# Patient Record
Sex: Female | Born: 1945
Health system: Southern US, Community
[De-identification: ages and names within clinical notes are randomized; demographics above are authoritative.]

## PROBLEM LIST (undated history)

## (undated) DIAGNOSIS — F418 Other specified anxiety disorders: Secondary | ICD-10-CM

## (undated) DIAGNOSIS — M810 Age-related osteoporosis without current pathological fracture: Secondary | ICD-10-CM

## (undated) DIAGNOSIS — E559 Vitamin D deficiency, unspecified: Secondary | ICD-10-CM

## (undated) DIAGNOSIS — M1990 Unspecified osteoarthritis, unspecified site: Secondary | ICD-10-CM

## (undated) DIAGNOSIS — R030 Elevated blood-pressure reading, without diagnosis of hypertension: Secondary | ICD-10-CM

## (undated) DIAGNOSIS — I10 Essential (primary) hypertension: Secondary | ICD-10-CM

## (undated) DIAGNOSIS — R7301 Impaired fasting glucose: Secondary | ICD-10-CM

## (undated) DIAGNOSIS — Z973 Presence of spectacles and contact lenses: Secondary | ICD-10-CM

## (undated) DIAGNOSIS — K219 Gastro-esophageal reflux disease without esophagitis: Secondary | ICD-10-CM

## (undated) DIAGNOSIS — S42202A Unspecified fracture of upper end of left humerus, initial encounter for closed fracture: Secondary | ICD-10-CM

## (undated) DIAGNOSIS — I4891 Unspecified atrial fibrillation: Secondary | ICD-10-CM

## (undated) DIAGNOSIS — M8580 Other specified disorders of bone density and structure, unspecified site: Secondary | ICD-10-CM

## (undated) DIAGNOSIS — I480 Paroxysmal atrial fibrillation: Secondary | ICD-10-CM

## (undated) DIAGNOSIS — C801 Malignant (primary) neoplasm, unspecified: Secondary | ICD-10-CM

## (undated) DIAGNOSIS — S82891A Other fracture of right lower leg, initial encounter for closed fracture: Secondary | ICD-10-CM

## (undated) DIAGNOSIS — E785 Hyperlipidemia, unspecified: Secondary | ICD-10-CM

## (undated) DIAGNOSIS — H9319 Tinnitus, unspecified ear: Secondary | ICD-10-CM

## (undated) DIAGNOSIS — IMO0001 Reserved for inherently not codable concepts without codable children: Secondary | ICD-10-CM

## (undated) HISTORY — DX: Other specified disorders of bone density and structure, unspecified site: M85.80

## (undated) HISTORY — DX: Paroxysmal atrial fibrillation: I48.0

## (undated) HISTORY — DX: Other specified anxiety disorders: F41.8

## (undated) HISTORY — DX: Hyperlipidemia, unspecified: E78.5

## (undated) HISTORY — DX: Vitamin D deficiency, unspecified: E55.9

## (undated) HISTORY — DX: Impaired fasting glucose: R73.01

## (undated) HISTORY — DX: Elevated blood-pressure reading, without diagnosis of hypertension: R03.0

## (undated) HISTORY — DX: Age-related osteoporosis without current pathological fracture: M81.0

## (undated) HISTORY — DX: Gastro-esophageal reflux disease without esophagitis: K21.9

## (undated) HISTORY — DX: Reserved for inherently not codable concepts without codable children: IMO0001

## (undated) HISTORY — PX: TIBIA FRACTURE SURGERY: SHX806

---

## 2006-04-20 HISTORY — PX: TIBIA FRACTURE SURGERY: SHX806

## 2006-04-25 ENCOUNTER — Emergency Department (HOSPITAL_COMMUNITY): Admission: EM | Admit: 2006-04-25 | Discharge: 2006-04-25 | Payer: Self-pay | Admitting: Emergency Medicine

## 2006-04-30 ENCOUNTER — Inpatient Hospital Stay (HOSPITAL_COMMUNITY): Admission: RE | Admit: 2006-04-30 | Discharge: 2006-05-01 | Payer: Self-pay | Admitting: Orthopaedic Surgery

## 2007-05-02 ENCOUNTER — Encounter: Admission: RE | Admit: 2007-05-02 | Discharge: 2007-05-02 | Payer: Self-pay | Admitting: Internal Medicine

## 2007-05-24 ENCOUNTER — Other Ambulatory Visit: Admission: RE | Admit: 2007-05-24 | Discharge: 2007-05-24 | Payer: Self-pay | Admitting: Gynecology

## 2008-06-19 ENCOUNTER — Encounter: Admission: RE | Admit: 2008-06-19 | Discharge: 2008-06-19 | Payer: Self-pay | Admitting: Obstetrics and Gynecology

## 2010-11-26 ENCOUNTER — Other Ambulatory Visit: Payer: Self-pay | Admitting: Obstetrics and Gynecology

## 2010-11-26 DIAGNOSIS — R928 Other abnormal and inconclusive findings on diagnostic imaging of breast: Secondary | ICD-10-CM

## 2010-12-02 ENCOUNTER — Ambulatory Visit
Admission: RE | Admit: 2010-12-02 | Discharge: 2010-12-02 | Disposition: A | Discharge status: Home / Self Care | Payer: Self-pay | Source: Ambulatory Visit | Attending: Obstetrics and Gynecology | Admitting: Obstetrics and Gynecology

## 2010-12-02 DIAGNOSIS — R928 Other abnormal and inconclusive findings on diagnostic imaging of breast: Secondary | ICD-10-CM

## 2011-02-05 NOTE — Op Note (Signed)
NAMEEARLIE, Alyssa Martin               ACCOUNT NO.:  [id number]   MEDICAL RECORD NO.:  [id number]          PATIENT TYPE:  OIB   LOCATION:  5010                         FACILITY:  [facility]   PHYSICIAN:  Vanita Panda. Magnus Ivan, M.D.DATE OF BIRTH:  10-23-1945   DATE OF PROCEDURE:  04/29/2006  DATE OF DISCHARGE:                                 OPERATIVE REPORT   PREOPERATIVE DIAGNOSIS:  Left ankle trimalleolar ankle fracture-dislocation.   POSTOPERATIVE DIAGNOSIS:  Left ankle trimalleolar ankle fracture-  dislocation.   PROCEDURE:  Open reduction internal fixation of left ankle fracture  including fixation of left fibula and left medial malleolus only.   SURGEON:  Vanita Panda. Magnus Ivan, MD   ANESTHESIA:  General.   ANTIBIOTICS:  600 mg IV clindamycin.   BLOOD LOSS:  Minimal.   COMPLICATIONS:  None.   TOURNIQUET TIME:  1 hour 32 minutes.   INDICATIONS:  Briefly Alyssa Martin is a very pleasant 65 year old who earlier  this week sustained a mechanical fall down a few stairs when she was moving  to a new house.  She sustained a trimalleolar ankle fracture-dislocation.  I  did see her in the emergency room, and she had intact skin.  I was able to  easily reduce her ankle and splint her, and she is presenting today for  operative intervention.  She has now noticed some tingling in her foot and  said her pain was minimal, and her foot was well-perfused.  I explained the  risks and benefits of the surgery to her, and she agreed to proceed with  surgery.   PROCEDURE DESCRIPTION:  After informed was obtained, the appropriate left  ankle was marked.  She was brought to the operating room and placed supine  on the operating table.  General anesthesia was then obtained.  A nonsterile  tourniquet was placed high on her upper thigh, and a bump was placed under  her left hip.  Her leg was then prepped and draped with DuraPrep and sterile  drapes.  An Esmarch was used to wrap up the leg and  tourniquet inflated to  300 mm of pressure.  Of note, prior to inflating the tourniquet, she was  identified as the correct patient and the correct extremity.  I then made an  incision over the fibula over the lateral aspect of her ankle, and a  fracture hematoma was noted.  I carried this down to the soft tissues to the  fibula in a meticulous dissection.  With the reduction forceps, I could  easily reduce a comminuted Weber B ankle fracture, a Weber B fibular  fracture.  I placed a single lag screw from anterior to posterior lagging  the fracture and could remove the reduction forceps.  I then placed a 1/3  semitubular Synthes locking plate along the posterior aspect as I could of  the fibula in an antiglide technique.  This was then secured with 3 locking  screws proximal and 2 locking screws distal.  Next, attention was turned to  the medial malleolus.  This fracture was reduced in its entirety.  This was  a low medial malleolus fracture.  I made an incision directly over this and  surprisingly found the deltoid ligament intact.  I was easily able to  identify the fracture fragments as a pretty distal medial malleolus  fracture.  I was able to secure this with a single guidepin and then holding  with a dental pick, placed a drill 90 degrees over fracture plane with a 2.5  mm drill bit.  I then placed a single partially-threaded cancellus screw  that was 4.0 mm in diameter and 50 mm in length.  This held the fracture in  reduced position and then under direct fluoroscopy, I was able to assess the  ankle throughout flexion, extension, and rotation and found to be intact.  The posterior malleolus piece was less than 10% of the articular surface of  the posterior malleolus, and ankle was again found to be intact.  I then  irrigated copiously all wounds and closed the deep tissue with interrupted 0  Vicryl suture followed with 2-0 Vicryl suture in the subcutaneous tissue and  2-0 nylon on  the skin in interrupted format.  Xeroform was applied followed  by a well-padded sterile dressing and then a posterior splint with stirrups  with plaster.  The tourniquet was let down at 1 hour and 32 minutes, and the  toes did pinken nicely.  The patient was then awakened, extubated, and taken  to the recovery room in stable condition.           ______________________________  Vanita Panda. Magnus Ivan, M.D.     CYB/MEDQ  D:  04/29/2006  T:  04/29/2006  Job:  [id number]

## 2011-02-05 NOTE — Discharge Summary (Signed)
Alyssa Martin, Alyssa Martin               ACCOUNT NO.:  [id number]   MEDICAL RECORD NO.:  [id number]          PATIENT TYPE:  INP   LOCATION:  5010                         FACILITY:  [facility]   PHYSICIAN:  Vanita Panda. Magnus Ivan, M.D.DATE OF BIRTH:  1945/11/22   DATE OF ADMISSION:  04/29/2006  DATE OF DISCHARGE:  05/01/2006                                 DISCHARGE SUMMARY   ADMITTING DIAGNOSIS:  Left trimalleolar ankle fracture.   DISCHARGE DIAGNOSIS:  Left trimalleolar ankle fracture.   PROCEDURE:  Open reduction internal fixation of left trimalleolar ankle  fracture dislocation.   HOSPITAL COURSE:  Alyssa Martin is a very pleasant individual who sustained a  mechanical fall less than a week prior to admission.  She was found to have  a closed ankle fracture dislocation and underwent closed reduction in the  emergency room.  She now presents for definitive treatment of this unstable  ankle fracture dislocation.  She was taken to the operating room on the day  of admission, where under general anesthesia, she underwent open reduction  internal fixation of the left ankle fibula and medial malleolus.  This was  done under fluoroscopy and was found to have a stable pattern after  fixation.  She tolerated this well and a splint was applied.  For details of  operation, please refer to the operative note in patient's medical records.  Postoperatively she did quite well and her postoperative course was  uneventful.  She began working with physical therapy with gait training with  a walker and nonweightbearing on that foot.  By the day of discharge, she  was tolerating a regular diet and oral pain medications and was deemed safe  to be discharged to home from a physical therapy standpoint.   DISPOSITION:  To home.   DISCHARGE MEDICATIONS:  She was given a prescription for oxycodone for pain  with the following instructions:  To continue to ambulate as tolerated, but  with touch-down weightbearing  only on her foot.  The followup will be  established in the office in 2 weeks after discharge.           ______________________________  Vanita Panda. Magnus Ivan, M.D.     CYB/MEDQ  D:  05/17/2006  T:  05/17/2006  Job:  [id number]

## 2011-05-10 HISTORY — PX: COLONOSCOPY: SHX174

## 2011-06-21 HISTORY — PX: DIAGNOSTIC MAMMOGRAM: HXRAD719

## 2012-09-08 ENCOUNTER — Encounter: Payer: Self-pay | Admitting: Cardiology

## 2012-09-08 ENCOUNTER — Ambulatory Visit (INDEPENDENT_AMBULATORY_CARE_PROVIDER_SITE_OTHER): Payer: BC Managed Care – PPO | Admitting: Cardiology

## 2012-09-08 VITALS — BP 130/78 | HR 74 | Ht 59.0 in | Wt 116.4 lb

## 2012-09-08 DIAGNOSIS — I4891 Unspecified atrial fibrillation: Secondary | ICD-10-CM

## 2012-09-08 DIAGNOSIS — I480 Paroxysmal atrial fibrillation: Secondary | ICD-10-CM | POA: Insufficient documentation

## 2012-09-08 MED ORDER — DILTIAZEM HCL ER COATED BEADS 180 MG PO CP24: 180.0000 mg | ORAL_CAPSULE | Freq: Every day | ORAL | Status: DC

## 2012-09-08 NOTE — Patient Instructions (Signed)
Stop Xarelto   Start ASA 81 mg daily  Switch diltiazem to diltiazem SR 180 mg daily  We will schedule you for an Echocardiogram.

## 2012-09-08 NOTE — Progress Notes (Signed)
Alyssa Martin Date of Birth: August 09, 1946 Medical Record #[id number]  History of Present Illness: [name] is seen today at the request of Dr. Jacky Kindle for evaluation of atrial fibrillation. She is a pleasant 66 year old white female who in general has been in good health. She does have a history of hypercholesterolemia. She has no history of hypertension, diabetes, or other cardiac disease. She's been under a great deal of stress recently. Her husband has undergone a bone marrow transplant and is back in the hospital with graft versus host disease. There are times where she has felt her pulse a little faster than normal. 2 days ago she states her heart just went crazy. She became lightheaded and felt her heart racing. She did not feel well. She went to her primary care office and was noted to be in atrial fibrillation with a rate of 144 beats per minute. She was observed in their office and converted back to normal sinus rhythm. This lasted about 2 or 3 hours. She states this is the only time she has had this sensation. It has not recurred since then. She was placed on short acting diltiazem and Xarelto. She reports she had complete lab work done in July.  Current Outpatient Prescriptions on File Prior to Visit  Medication Sig Dispense Refill  . Calcium Citrate-Vitamin D (CITRACAL MAXIMUM) 315-250 MG-UNIT TABS Take 2 tablets by mouth 2 (two) times daily.      Marland Kitchen loratadine (CLARITIN) 10 MG tablet Take 10 mg by mouth as needed.      . mometasone (NASONEX) 50 MCG/ACT nasal spray Place 2 sprays into the nose daily.      . Olopatadine HCl (PATANASE) 0.6 % SOLN Place into the nose as directed.      Marland Kitchen omeprazole (PRILOSEC) 20 MG capsule Take 20 mg by mouth daily.      . simvastatin (ZOCOR) 20 MG tablet Take 20 mg by mouth every evening.      . diltiazem (CARDIZEM CD) 180 MG 24 hr capsule Take 1 capsule (180 mg total) by mouth daily.  90 capsule  3  . escitalopram (LEXAPRO) 10 MG tablet Take 10 mg by  mouth daily.        Allergies  Allergen Reactions  . Penicillins     Past Medical History  Diagnosis Date  . Osteopenia   . Osteoporosis   . GERD (gastroesophageal reflux disease)   . Hyperlipidemia   . Impaired fasting glucose   . Vitamin D deficiency   . Elevated BP   . Depression with anxiety   . Paroxysmal atrial fibrillation     Past Surgical History  Procedure Date  . Colonoscopy 05/10/2011  . Diagnostic mammogram 10 /09/2010  . Tibia fracture surgery 04/2006    Left--From falling down the stairs  . Tibia fracture surgery     In her 40's    History  Smoking status  . Never Smoker   Smokeless tobacco  . Not on file    History  Alcohol Use No    Family History  Problem Relation Age of Onset  . Hypertension Mother   . Osteoarthritis Mother   . Bone cancer Father   . Osteopenia Father   . Lung cancer Brother   . Lung cancer Paternal Grandmother   . Leukemia Paternal Grandfather   . Bone cancer Brother     Review of Systems: The review of systems is positive for situational stress. She has no chest pain or shortness of breath.  She has no history of syncope. She denies any TIA or CVA symptoms. She has no history of bleeding. All other systems were reviewed and are negative.  Physical Exam: BP 130/78  Pulse 74  Ht 4\' 11"  (1.499 m)  Wt 116 lb 6.4 oz (52.799 kg)  BMI 23.51 kg/m2  SpO2 99% She is a pleasant, small frame white female in no acute distress.The patient is alert and oriented x 3.  The mood and affect are normal.  The skin is warm and dry.  Color is normal.  The HEENT exam reveals that the sclera are nonicteric.  The mucous membranes are moist.  The carotids are 2+ without bruits.  There is no thyromegaly.  There is no JVD.  The lungs are clear.  The chest wall is non tender.  The heart exam reveals a regular rate with occasional extrasystoles and normal S1 and S2.  There are no murmurs, gallops, or rubs.  The PMI is not displaced.   Abdominal exam  reveals good bowel sounds.  There is no guarding or rebound.  There is no hepatosplenomegaly or tenderness.  There are no masses.  Exam of the legs reveal no clubbing, cyanosis, or edema.  The legs are without rashes.  The distal pulses are intact.  Cranial nerves II - XII are intact.  Motor and sensory functions are intact.  The gait is normal.  LABORATORY DATA: ECG today demonstrates normal sinus rhythm with occasional PACs a rate of 68 beats per minute. It is otherwise normal.  Assessment / Plan: 1. Paroxysmal atrial fibrillation. Patient has been under a great deal of situational stress. She did convert spontaneously to normal sinus rhythm. She does have PACs at baseline. We will review her lab work from July. We will obtain an echocardiogram. I switched her to long-acting diltiazem 180 mg daily. Her [place] score is 0 and her [place] vascular score is 2. I'll stop her Xarelto and recommend she take aspirin 81 mg daily. We will monitor her for recurrent palpitations. I will plan a followup again in 4 weeks.

## 2012-09-14 ENCOUNTER — Ambulatory Visit (HOSPITAL_COMMUNITY): Payer: BC Managed Care – PPO | Attending: Cardiology

## 2012-09-14 DIAGNOSIS — I080 Rheumatic disorders of both mitral and aortic valves: Secondary | ICD-10-CM | POA: Insufficient documentation

## 2012-09-14 DIAGNOSIS — I480 Paroxysmal atrial fibrillation: Secondary | ICD-10-CM

## 2012-09-14 DIAGNOSIS — I319 Disease of pericardium, unspecified: Secondary | ICD-10-CM | POA: Insufficient documentation

## 2012-09-14 DIAGNOSIS — I369 Nonrheumatic tricuspid valve disorder, unspecified: Secondary | ICD-10-CM | POA: Insufficient documentation

## 2012-09-14 DIAGNOSIS — I4891 Unspecified atrial fibrillation: Secondary | ICD-10-CM | POA: Insufficient documentation

## 2012-09-14 DIAGNOSIS — I379 Nonrheumatic pulmonary valve disorder, unspecified: Secondary | ICD-10-CM | POA: Insufficient documentation

## 2012-09-14 NOTE — Progress Notes (Signed)
Echocardiogram performed.  

## 2012-09-18 ENCOUNTER — Telehealth: Payer: Self-pay | Admitting: Cardiology

## 2012-09-18 NOTE — Telephone Encounter (Signed)
New Problem:    Patient called in returning Christine's call regarding her ECHO results.  Please call back.

## 2012-09-18 NOTE — Telephone Encounter (Signed)
Pt given Echo results, she verbalized understanding. 

## 2012-09-25 ENCOUNTER — Encounter: Payer: Self-pay | Admitting: Cardiology

## 2012-09-28 ENCOUNTER — Telehealth: Payer: Self-pay | Admitting: Cardiology

## 2012-09-28 NOTE — Telephone Encounter (Signed)
She does not need SBE prophylaxis.  Alyssa Macari [place] MD, [facility]

## 2012-09-28 NOTE — Telephone Encounter (Signed)
Patient called was told Dr.Jordan advised does not need antibiotics for dental cleaning.

## 2012-09-28 NOTE — Telephone Encounter (Signed)
Has a dental cleaning 10-02-12 does she need to do anything prior

## 2012-09-28 NOTE — Telephone Encounter (Signed)
Patient called wants to know if she needs antibiotic before dental cleaning.Message sent to Dr.Jordan for advice.

## 2012-10-03 ENCOUNTER — Encounter: Payer: Self-pay | Admitting: Cardiology

## 2012-10-03 ENCOUNTER — Ambulatory Visit (INDEPENDENT_AMBULATORY_CARE_PROVIDER_SITE_OTHER): Payer: BC Managed Care – PPO | Admitting: Cardiology

## 2012-10-03 VITALS — BP 118/70 | HR 72 | Ht 59.0 in | Wt 116.8 lb

## 2012-10-03 DIAGNOSIS — I4891 Unspecified atrial fibrillation: Secondary | ICD-10-CM

## 2012-10-03 DIAGNOSIS — I480 Paroxysmal atrial fibrillation: Secondary | ICD-10-CM

## 2012-10-03 NOTE — Progress Notes (Signed)
Alyssa Martin Date of Birth: 08/24/1946 Medical Record #[id number]  History of Present Illness: Alyssa Martin is seen for follow up today. Since starting on diltiazem she denies any further palpitations, dizzyness, or SOB. Her stress level has decreased since her husband is home from the hospital. She has eliminated caffeine from her diet. She feels well.  Current Outpatient Prescriptions on File Prior to Visit  Medication Sig Dispense Refill  . Calcium Citrate-Vitamin D (CITRACAL MAXIMUM) 315-250 MG-UNIT TABS Take 2 tablets by mouth 2 (two) times daily.      . ergocalciferol (VITAMIN D2) 50000 UNITS capsule Take 50,000 Units by mouth every 14 (fourteen) days.      Marland Kitchen loratadine (CLARITIN) 10 MG tablet Take 10 mg by mouth as needed.      . mometasone (NASONEX) 50 MCG/ACT nasal spray Place 2 sprays into the nose daily.      . Multiple Vitamin (MULTIVITAMIN) tablet Take 1 tablet by mouth daily.      Marland Kitchen omeprazole (PRILOSEC) 20 MG capsule Take 20 mg by mouth daily as needed.       . raloxifene (EVISTA) 60 MG tablet Take 60 mg by mouth daily.      . simvastatin (ZOCOR) 20 MG tablet Take 20 mg by mouth every evening.        Allergies  Allergen Reactions  . Penicillins     Past Medical History  Diagnosis Date  . Osteopenia   . Osteoporosis   . GERD (gastroesophageal reflux disease)   . Hyperlipidemia   . Impaired fasting glucose   . Vitamin D deficiency   . Elevated BP   . Depression with anxiety   . Paroxysmal atrial fibrillation     Past Surgical History  Procedure Date  . Colonoscopy 05/10/2011  . Diagnostic mammogram 10 /09/2010  . Tibia fracture surgery 04/2006    Left--From falling down the stairs  . Tibia fracture surgery     In her 40's    History  Smoking status  . Never Smoker   Smokeless tobacco  . Not on file    History  Alcohol Use No    Family History  Problem Relation Age of Onset  . Hypertension Mother   . Osteoarthritis Mother   . Bone cancer  Father   . Osteopenia Father   . Lung cancer Brother   . Lung cancer Paternal Grandmother   . Leukemia Paternal Grandfather   . Bone cancer Brother     Review of Systems: As noted in HPI. All other systems were reviewed and are negative.  Physical Exam: BP 118/70  Pulse 72  Ht 4\' 11"  (1.499 m)  Wt 116 lb 12.8 oz (52.98 kg)  BMI 23.59 kg/m2  SpO2 96% She is a pleasant, small frame white female in no acute distress.The patient is alert and oriented x 3.    The skin is warm and dry.   The HEENT exam is normal.  The carotids are 2+ without bruits.  There is no thyromegaly.  There is no JVD.  The lungs are clear.   The heart exam reveals a regular rate and rhythm, normal S1 and S2.  There are no murmurs, gallops, or rubs.  The PMI is not displaced.   Abdominal exam reveals good bowel sounds.  Exam of the legs reveal no clubbing, cyanosis, or edema.   The distal pulses are intact.  Cranial nerves II - XII are intact.  Motor and sensory functions are intact.  The gait  is normal.  LABORATORY DATA: ------------------------------------------------------------ Transthoracic Echocardiography  Patient: Alyssa Martin, Alyssa Martin MR #: [id number] Study Date: 09/14/2012 Gender: F Age: 67 Height: 149.9cm Weight: 52.6kg BSA: 1.61m^2 Pt. Status: Room:  ORDERING [place], Lugene Beougher REFERRING [place], Aquarius Tremper ATTENDING Willa Rough PERFORMING Redge Gainer, [facility] SONOGRAPHER Philomena Course, RDCS cc:  ------------------------------------------------------------ LV EF: 55% - 65%  ------------------------------------------------------------ Indications: Paroxysmal Atrial fibrillation - 427.31.  ------------------------------------------------------------ History: PMH: Premature atrial contractions. Acquired from the patient and from the patient's chart. Paroxysmal Atrial fibrillation. Tachycardia. Risk factors: Dyslipidemia.  ------------------------------------------------------------ Study Conclusions  -  Left ventricle: The cavity size was normal. Wall thickness was normal. Systolic function was normal. The estimated ejection fraction was in the range of 55% to 65%. Wall motion was normal; there were no regional wall motion abnormalities. Doppler parameters are consistent with abnormal left ventricular relaxation (grade 1 diastolic dysfunction). - Aortic valve: Trivial regurgitation. - Mitral valve: Mild regurgitation. - Left atrium: The atrium was mildly dilated. - Atrial septum: No defect or patent foramen ovale was identified. - Pericardium, extracardiac: A trivial pericardial effusion was identified. Transthoracic echocardiography. M-mode, complete 2D, spectral Doppler, and color Doppler. Height: Height: 149.9cm. Height: 59in. Weight: Weight: 52.6kg. Weight: 115.8lb. Body mass index: BMI: 23.4kg/m^2. Body surface area: BSA: 1.18m^2. Blood pressure: 130/78. Patient status: Outpatient. Location:  Site 3  ------------------------------------------------------------  ------------------------------------------------------------ Left ventricle: The cavity size was normal. Wall thickness was normal. Systolic function was normal. The estimated ejection fraction was in the range of 55% to 65%. Wall motion was normal; there were no regional wall motion abnormalities. Doppler parameters are consistent with abnormal left ventricular relaxation (grade 1 diastolic dysfunction).  ------------------------------------------------------------ Aortic valve: Structurally normal valve. Trileaflet. Cusp separation was normal. Doppler: Transvalvular velocity was within the normal range. There was no stenosis. Trivial regurgitation.  ------------------------------------------------------------ Aorta: The aorta was normal, not dilated, and non-diseased.  ------------------------------------------------------------ Mitral valve: Structurally normal valve. Leaflet separation was normal.  Doppler: Transvalvular velocity was within the normal range. There was no evidence for stenosis. Mild regurgitation. Peak gradient: 4mm Hg (D).  ------------------------------------------------------------ Left atrium: The atrium was mildly dilated.  ------------------------------------------------------------ Atrial septum: No defect or patent foramen ovale was identified.  ------------------------------------------------------------ Right ventricle: The cavity size was normal. Wall thickness was normal. Systolic function was normal.  ------------------------------------------------------------ Pulmonic valve: Structurally normal valve. Cusp separation was normal. Doppler: Transvalvular velocity was within the normal range. Trivial regurgitation.  ------------------------------------------------------------ Tricuspid valve: Structurally normal valve. Leaflet separation was normal. Doppler: Transvalvular velocity was within the normal range. Mild regurgitation.  ------------------------------------------------------------ Pulmonary artery: The main pulmonary artery was normal-sized.  ------------------------------------------------------------ Right atrium: The atrium was normal in size.  ------------------------------------------------------------ Pericardium: A trivial pericardial effusion was identified.  ------------------------------------------------------------ Systemic veins: Inferior vena cava: The vessel was normal in size; the respirophasic diameter changes were in the normal range (= 50%); findings are consistent with normal central venous pressure.  ------------------------------------------------------------ Post procedure conclusions Ascending Aorta:  - The aorta was normal, not dilated, and non-diseased.  ------------------------------------------------------------  2D measurements Normal Doppler measurements Norma Left ventricle l LVID ED, 34.9 mm  43-52 Main pulmonary artery chord, Pressure, 22 mm Hg =30 PLAX S LVID ES, 22.8 mm 23-38 Left ventricle chord, Ea, lat 9.2 cm/s ----- PLAX ann, tiss 5 FS, chord, 35 % >29 DP PLAX E/Ea, lat 10. ----- LVPW, ED 8.4 mm ------ ann, tiss 56 IVS/LVPW 0.98 <1.3 DP ratio, ED Ea, med 6.9 cm/s ----- Ventricular septum ann, tiss 1 IVS, ED 8.19 mm ------ DP LVOT E/Ea, med 14. -----  Diam, S 19 mm ------ ann, tiss 14 Area 2.84 cm^2 ------ DP Diam 19 mm ------ LVOT Aorta Peak vel, 96. cm/s ----- Root diam, 29 mm ------ S 3 ED VTI, S 22. cm ----- Left atrium 6 AP dim 38 mm ------ HR 60 bpm ----- AP dim 2.6 cm/m^2 <2.2 Stroke vol 64. ml ----- index 1 Cardiac 3.8 L/min ----- output Cardiac 2.6 L/(min-m ----- index ^2) Stroke 43. ml/m^2 ----- index 9 Mitral valve Peak E vel 97. cm/s ----- 7 Peak A vel 125 cm/s ----- Decelerati 197 ms 150-2 on time 30 Peak 4 mm Hg ----- gradient, D Peak E/A 0.8 ----- ratio Tricuspid valve Regurg 209 cm/s ----- peak vel Peak RV-RA 17 mm Hg ----- gradient, S Systemic veins Estimated 5 mm Hg ----- CVP Right ventricle Pressure, 22 mm Hg <30 S Sa vel, 9.5 cm/s ----- lat ann, 8 tiss DP Pulmonic valve Peak vel, 71. cm/s ----- S 1  ------------------------------------------------------------ Prepared and Electronically Authenticated by  Charlton Haws 2013-12-26T12:01:17.200   Assessment / Plan: 1. Paroxysmal atrial fibrillation. Echo shows mild MR and mild LAE. Improved on diltiazem and now asymptomatic. Continue avoidance of stimulants. Follow up in 6 months. If no further arrhythmia at that time consider stopping dilitiazem.

## 2012-10-03 NOTE — Patient Instructions (Signed)
Continue your current medication  I will see you in 6 months.   

## 2012-11-04 ENCOUNTER — Other Ambulatory Visit: Payer: Self-pay

## 2013-04-25 ENCOUNTER — Other Ambulatory Visit: Payer: Self-pay

## 2013-06-27 ENCOUNTER — Encounter: Payer: Self-pay | Admitting: Cardiology

## 2013-06-27 ENCOUNTER — Ambulatory Visit (INDEPENDENT_AMBULATORY_CARE_PROVIDER_SITE_OTHER): Payer: BC Managed Care – PPO | Admitting: Cardiology

## 2013-06-27 VITALS — BP 121/67 | HR 75 | Ht 59.0 in | Wt 114.0 lb

## 2013-06-27 DIAGNOSIS — I480 Paroxysmal atrial fibrillation: Secondary | ICD-10-CM

## 2013-06-27 DIAGNOSIS — I4891 Unspecified atrial fibrillation: Secondary | ICD-10-CM

## 2013-06-27 NOTE — Patient Instructions (Signed)
Continue your current therapy  I will see you in one year   

## 2013-06-27 NOTE — Progress Notes (Signed)
Alyssa Martin Date of Birth: Jul 03, 1946 Medical Record #[id number]  History of Present Illness: Alyssa Martin is seen for follow up today. Alyssa Martin has a history of paroxysmal atrial fibrillation. Alyssa Martin is currently on no therapy. Alyssa Martin has done very well this year with only a couple of minor episodes of arrhythmia. If Alyssa Martin stops and rests this will resolve on its on in less than 30 minutes. Alyssa Martin has been under less stress this year since her husband received a bone marrow transplant and is doing very well with his cancer.  Current Outpatient Prescriptions on File Prior to Visit  Medication Sig Dispense Refill  . Calcium Citrate-Vitamin D (CITRACAL MAXIMUM) 315-250 MG-UNIT TABS Take 2 tablets by mouth 2 (two) times daily.      . ergocalciferol (VITAMIN D2) 50000 UNITS capsule Take 50,000 Units by mouth every 14 (fourteen) days.      Marland Kitchen loratadine (CLARITIN) 10 MG tablet Take 10 mg by mouth as needed.      . mometasone (NASONEX) 50 MCG/ACT nasal spray Place 2 sprays into the nose daily.      . Multiple Vitamin (MULTIVITAMIN) tablet Take 1 tablet by mouth daily.      Marland Kitchen omeprazole (PRILOSEC) 20 MG capsule Take 20 mg by mouth daily as needed.       . simvastatin (ZOCOR) 20 MG tablet Take 20 mg by mouth every evening.       No current facility-administered medications on file prior to visit.    Allergies  Allergen Reactions  . Penicillins     Past Medical History  Diagnosis Date  . Osteopenia   . Osteoporosis   . GERD (gastroesophageal reflux disease)   . Hyperlipidemia   . Impaired fasting glucose   . Vitamin D deficiency   . Elevated BP   . Depression with anxiety   . Paroxysmal atrial fibrillation     Past Surgical History  Procedure Laterality Date  . Colonoscopy  05/10/2011  . Diagnostic mammogram  10 /09/2010  . Tibia fracture surgery  04/2006    Left--From falling down the stairs  . Tibia fracture surgery      In her 40's    History  Smoking status  . Never Smoker   Smokeless  tobacco  . Not on file    History  Alcohol Use No    Family History  Problem Relation Age of Onset  . Hypertension Mother   . Osteoarthritis Mother   . Bone cancer Father   . Osteopenia Father   . Lung cancer Brother   . Lung cancer Paternal Grandmother   . Leukemia Paternal Grandfather   . Bone cancer Brother     Review of Systems: As noted in HPI. All other systems were reviewed and are negative.  Physical Exam: BP 121/67  Pulse 75  Ht 4\' 11"  (1.499 m)  Wt 114 lb (51.71 kg)  BMI 23.01 kg/m2 Alyssa Martin is a pleasant, small frame white female in no acute distress.  The HEENT exam is normal.  The carotids are 2+ without bruits.  There is no thyromegaly.  There is no JVD.  The lungs are clear.   The heart exam reveals a regular rate and rhythm, normal S1 and S2.  There are no murmurs, gallops, or rubs.  The PMI is not displaced.   Abdominal exam reveals good bowel sounds.  Exam of the legs reveal no clubbing, cyanosis, or edema.   The distal pulses are intact.  Cranial nerves II - XII  are intact.  Motor and sensory functions are intact.  The gait is normal.  LABORATORY DATA:    Assessment / Plan: 1. Paroxysmal atrial fibrillation. Echo shows mild MR and mild LAE. Infrequent symptoms on no medication currently. [place] score is 0. Continue a baby aspirin daily. I'll followup again in one year. If her symptoms become more frequent or sustained we will consider antiarrhythmic drug therapy.

## 2013-07-26 ENCOUNTER — Other Ambulatory Visit: Payer: Self-pay

## 2013-10-10 ENCOUNTER — Other Ambulatory Visit: Payer: Self-pay | Admitting: Cardiology

## 2014-07-25 ENCOUNTER — Encounter (HOSPITAL_COMMUNITY): Payer: Self-pay

## 2014-07-25 ENCOUNTER — Ambulatory Visit (HOSPITAL_COMMUNITY)
Admission: RE | Admit: 2014-07-25 | Discharge: 2014-07-25 | Disposition: A | Discharge status: Home / Self Care | Payer: BC Managed Care – PPO | Source: Ambulatory Visit | Attending: Internal Medicine | Admitting: Internal Medicine

## 2014-07-25 ENCOUNTER — Other Ambulatory Visit (HOSPITAL_COMMUNITY): Payer: Self-pay | Admitting: Internal Medicine

## 2014-07-25 DIAGNOSIS — M810 Age-related osteoporosis without current pathological fracture: Secondary | ICD-10-CM | POA: Diagnosis present

## 2014-07-25 MED ORDER — SODIUM CHLORIDE 0.9 % IV SOLN
Freq: Once | INTRAVENOUS | Status: AC
  Administered 2014-07-25: 13:00:00 via INTRAVENOUS

## 2014-07-25 MED ORDER — ZOLEDRONIC ACID 5 MG/100ML IV SOLN
5.0000 mg | Freq: Once | INTRAVENOUS | Status: AC
  Administered 2014-07-25: 5 mg via INTRAVENOUS
  Filled 2014-07-25: qty 100

## 2014-07-25 NOTE — Discharge Instructions (Signed)

## 2014-09-18 ENCOUNTER — Ambulatory Visit: Payer: BC Managed Care – PPO | Admitting: Cardiology

## 2014-10-10 ENCOUNTER — Other Ambulatory Visit: Payer: Self-pay

## 2014-10-10 MED ORDER — DILTIAZEM HCL ER COATED BEADS 180 MG PO CP24
180.0000 mg | ORAL_CAPSULE | Freq: Every day | ORAL | Status: DC
Start: 2014-10-10 — End: 2015-01-21

## 2014-11-04 ENCOUNTER — Ambulatory Visit: Payer: BC Managed Care – PPO | Admitting: Cardiology

## 2014-12-12 ENCOUNTER — Ambulatory Visit (INDEPENDENT_AMBULATORY_CARE_PROVIDER_SITE_OTHER): Payer: BC Managed Care – PPO | Admitting: Cardiology

## 2014-12-12 ENCOUNTER — Encounter: Payer: Self-pay | Admitting: Cardiology

## 2014-12-12 VITALS — BP 122/74 | HR 65 | Ht 59.0 in | Wt 123.1 lb

## 2014-12-12 DIAGNOSIS — I480 Paroxysmal atrial fibrillation: Secondary | ICD-10-CM | POA: Diagnosis not present

## 2014-12-12 MED ORDER — RIVAROXABAN 20 MG PO TABS: 20.0000 mg | ORAL_TABLET | Freq: Every day | ORAL | Status: DC

## 2014-12-12 NOTE — Patient Instructions (Signed)
Start Xarelto 20 mg daily  Do not take ASA or NSAIDs  We will check your kidney function and blood counts.  I will see you in 6 months.

## 2014-12-12 NOTE — Progress Notes (Signed)
Alyssa Martin Date of Birth: 1945-09-22 Medical Record #[id number]  History of Present Illness: Alyssa Martin is seen for follow up Afib. She has a history of paroxysmal atrial fibrillation dating back to 2013. She is currently on no therapy. She has done very well this year but has had 3-4 episodes of arrhythmia. She has been under a lot of stress with her husbands illness with cancer and prior BM transplant. She had one episode that lasted 30-60 minutes. On one occasion her BP dropped to 85 systolic. When she has her Afib her HR never goes above 95 bpm. She does complain that ASA causes stomach upset.   Current Outpatient Prescriptions on File Prior to Visit  Medication Sig Dispense Refill  . diltiazem (CARDIZEM CD) 180 MG 24 hr capsule Take 1 capsule (180 mg total) by mouth daily. 90 capsule 0  . ergocalciferol (VITAMIN D2) 50000 UNITS capsule Take 50,000 Units by mouth every 14 (fourteen) days.    Marland Kitchen loratadine (CLARITIN) 10 MG tablet Take 10 mg by mouth as needed.    . mometasone (NASONEX) 50 MCG/ACT nasal spray Place 2 sprays into the nose daily.    . Multiple Vitamin (MULTIVITAMIN) tablet Take 1 tablet by mouth daily.    Marland Kitchen omeprazole (PRILOSEC) 20 MG capsule Take 20 mg by mouth daily as needed.     . simvastatin (ZOCOR) 20 MG tablet Take 20 mg by mouth every evening.     No current facility-administered medications on file prior to visit.    Allergies  Allergen Reactions  . Penicillins     Past Medical History  Diagnosis Date  . Osteopenia   . Osteoporosis   . GERD (gastroesophageal reflux disease)   . Hyperlipidemia   . Impaired fasting glucose   . Vitamin D deficiency   . Elevated BP   . Depression with anxiety   . Paroxysmal atrial fibrillation     Past Surgical History  Procedure Laterality Date  . Colonoscopy  05/10/2011  . Diagnostic mammogram  10 /09/2010  . Tibia fracture surgery  04/2006    Left--From falling down the stairs  . Tibia fracture surgery     In her 80's    History  Smoking status  . Never Smoker   Smokeless tobacco  . Not on file    History  Alcohol Use No    Family History  Problem Relation Age of Onset  . Hypertension Mother   . Osteoarthritis Mother   . Bone cancer Father   . Osteopenia Father   . Lung cancer Brother   . Lung cancer Paternal Grandmother   . Leukemia Paternal Grandfather   . Bone cancer Brother     Review of Systems: As noted in HPI. All other systems were reviewed and are negative.  Physical Exam: BP 122/74 mmHg  Pulse 65  Ht 4\' 11"  (1.499 m)  Wt 123 lb 1.6 oz (55.838 kg)  BMI 24.85 kg/m2 She is a pleasant, small frame white female in no acute distress.  The HEENT exam is normal.  The carotids are 2+ without bruits.  There is no thyromegaly.  There is no JVD.  The lungs are clear.   The heart exam reveals a regular rate and rhythm, normal S1 and S2.  There are no murmurs, gallops, or rubs.  The PMI is not displaced.   Abdominal exam reveals good bowel sounds.  Exam of the legs reveal no clubbing, cyanosis, or edema.   The distal pulses are intact.  Cranial nerves II - XII are intact.  Motor and sensory functions are intact.  The gait is normal.  LABORATORY DATA: Ecg today shows NSR with PACs. Otherwise normal.   Assessment / Plan: 1. Paroxysmal atrial fibrillation. Echo showed mild MR and mild LAE. Infrequent symptoms. Good rate control on diltiazem. CHAD-Vasc score is 2 and she is turning 69 this year. I have recommended starting her on anticoagulant therapy. We discussed the risk and benefit of therapy. She is in agreement. We will start her on Xarelto 20 mg daily since this is on her formulary. Will check baseline CBC and BMET. We discussed that if she has increased frequency or severity of AFib. We would consider antiarrythmic drug therapy. I will follow up in 6 months.

## 2014-12-13 LAB — CBC WITH DIFFERENTIAL/PLATELET
BASOS ABS: 0 10*3/uL (ref 0.0–0.1)
BASOS PCT: 0 % (ref 0–1)
EOS ABS: 0.1 10*3/uL (ref 0.0–0.7)
EOS PCT: 2 % (ref 0–5)
HCT: 42.7 % (ref 36.0–46.0)
Hemoglobin: 14.4 g/dL (ref 12.0–15.0)
LYMPHS ABS: 2 10*3/uL (ref 0.7–4.0)
Lymphocytes Relative: 42 % (ref 12–46)
MCH: 29.6 pg (ref 26.0–34.0)
MCHC: 33.7 g/dL (ref 30.0–36.0)
MCV: 87.7 fL (ref 78.0–100.0)
MPV: 8.8 fL (ref 8.6–12.4)
Monocytes Absolute: 0.5 10*3/uL (ref 0.1–1.0)
Monocytes Relative: 10 % (ref 3–12)
NEUTROS PCT: 46 % (ref 43–77)
Neutro Abs: 2.2 10*3/uL (ref 1.7–7.7)
PLATELETS: 252 10*3/uL (ref 150–400)
RBC: 4.87 MIL/uL (ref 3.87–5.11)
RDW: 14 % (ref 11.5–15.5)
WBC: 4.8 10*3/uL (ref 4.0–10.5)

## 2014-12-13 LAB — BASIC METABOLIC PANEL
BUN: 17 mg/dL (ref 6–23)
CHLORIDE: 105 meq/L (ref 96–112)
CO2: 27 mEq/L (ref 19–32)
Calcium: 9.5 mg/dL (ref 8.4–10.5)
Creat: 0.67 mg/dL (ref 0.50–1.10)
GLUCOSE: 87 mg/dL (ref 70–99)
Potassium: 4.2 mEq/L (ref 3.5–5.3)
Sodium: 141 mEq/L (ref 135–145)

## 2014-12-16 ENCOUNTER — Telehealth: Payer: Self-pay

## 2014-12-16 NOTE — Telephone Encounter (Signed)
Received a call from patient she wanted to know if ok to exercise.Stated she joined the [employer].Spoke to [place] he advised ok to exercise.

## 2015-01-21 ENCOUNTER — Other Ambulatory Visit: Payer: Self-pay | Admitting: Cardiology

## 2015-01-21 NOTE — Telephone Encounter (Signed)
Rx refill sent to patient pharmacy   

## 2015-03-17 ENCOUNTER — Other Ambulatory Visit: Payer: Self-pay

## 2015-06-30 ENCOUNTER — Encounter: Payer: Self-pay | Admitting: Cardiology

## 2015-06-30 ENCOUNTER — Ambulatory Visit (INDEPENDENT_AMBULATORY_CARE_PROVIDER_SITE_OTHER): Payer: BC Managed Care – PPO | Admitting: Cardiology

## 2015-06-30 VITALS — BP 140/81 | HR 73 | Ht 59.0 in | Wt 121.9 lb

## 2015-06-30 DIAGNOSIS — I480 Paroxysmal atrial fibrillation: Secondary | ICD-10-CM

## 2015-06-30 NOTE — Patient Instructions (Signed)
Continue your current therapy  I will see you in 6 months.   

## 2015-06-30 NOTE — Progress Notes (Signed)
Alyssa Martin Date of Birth: 05/22/1946 Medical Record #[id number]  History of Present Illness: Alyssa Martin is seen for follow up Afib. She has a history of paroxysmal atrial fibrillation dating back to 2013.  She has been on cardizem and Xarelto. Tolerating these well. No Afib episodes for the last 6 months. She eliminated caffeine use. She is exercising regularly.  Current Outpatient Prescriptions on File Prior to Visit  Medication Sig Dispense Refill  . Calcium Citrate (CITRACAL PO) Take 630 mg by mouth 4 (four) times daily.    Marland Kitchen CARTIA XT 180 MG 24 hr capsule TAKE 1 CAPSULE BY MOUTH ONCE DAILY 90 capsule 3  . ergocalciferol (VITAMIN D2) 50000 UNITS capsule Take 50,000 Units by mouth every 14 (fourteen) days.    Marland Kitchen loratadine (CLARITIN) 10 MG tablet Take 10 mg by mouth as needed.    . mometasone (NASONEX) 50 MCG/ACT nasal spray Place 2 sprays into the nose daily.    . Multiple Vitamin (MULTIVITAMIN) tablet Take 1 tablet by mouth daily.    Marland Kitchen omeprazole (PRILOSEC) 20 MG capsule Take 20 mg by mouth daily as needed.     . rivaroxaban (XARELTO) 20 MG TABS tablet Take 1 tablet (20 mg total) by mouth daily with supper. 30 tablet 11  . simvastatin (ZOCOR) 20 MG tablet Take 20 mg by mouth every evening.    . zoledronic acid (RECLAST) 5 MG/100ML SOLN injection Inject 5 mg into the vein once. Yearly     No current facility-administered medications on file prior to visit.    Allergies  Allergen Reactions  . Penicillins     Past Medical History  Diagnosis Date  . Osteopenia   . Osteoporosis   . GERD (gastroesophageal reflux disease)   . Hyperlipidemia   . Impaired fasting glucose   . Vitamin D deficiency   . Elevated BP   . Depression with anxiety   . Paroxysmal atrial fibrillation [facility])     Past Surgical History  Procedure Laterality Date  . Colonoscopy  05/10/2011  . Diagnostic mammogram  10 /09/2010  . Tibia fracture surgery  04/2006    Left--From falling down the stairs  .  Tibia fracture surgery      In her 79's    History  Smoking status  . Never Smoker   Smokeless tobacco  . Not on file    History  Alcohol Use No    Family History  Problem Relation Age of Onset  . Hypertension Mother   . Osteoarthritis Mother   . Bone cancer Father   . Osteopenia Father   . Lung cancer Brother   . Lung cancer Paternal Grandmother   . Leukemia Paternal Grandfather   . Bone cancer Brother     Review of Systems: As noted in HPI. All other systems were reviewed and are negative.  Physical Exam: BP 140/81 mmHg  Pulse 73  Ht 4\' 11"  (1.499 m)  Wt 55.293 kg (121 lb 14.4 oz)  BMI 24.61 kg/m2 She is a pleasant, small frame white female in no acute distress.  The HEENT exam is normal.  The carotids are 2+ without bruits.  There is no thyromegaly.  There is no JVD.  The lungs are clear.   The heart exam reveals a regular rate and rhythm, normal S1 and S2.  There are no murmurs, gallops, or rubs.  The PMI is not displaced.   Abdominal exam reveals good bowel sounds.  Exam of the legs reveal no clubbing, cyanosis,  or edema.   The distal pulses are intact.  Cranial nerves II - XII are intact.  Motor and sensory functions are intact.  The gait is normal.  LABORATORY DATA: Lab Results  Component Value Date   WBC 4.8 12/13/2014   HGB 14.4 12/13/2014   HCT 42.7 12/13/2014   PLT 252 12/13/2014   GLUCOSE 87 12/13/2014   NA 141 12/13/2014   K 4.2 12/13/2014   CL 105 12/13/2014   CREATININE 0.67 12/13/2014   BUN 17 12/13/2014   CO2 27 12/13/2014      Assessment / Plan: 1. Paroxysmal atrial fibrillation. Echo showed mild MR and mild LAE. Infrequent symptoms. None for the last 6 months. Good rate control on diltiazem. On Xarelto for anticoagulation. We discussed that if she has increased frequency or severity of AFib. We would consider antiarrythmic drug therapy. I will follow up in 6 months.

## 2015-07-15 ENCOUNTER — Encounter: Payer: Self-pay | Admitting: Cardiology

## 2015-10-02 MED FILL — XARELTO 20 MG TABLET: 20 | 30 days supply | Qty: 30 | Fill #9

## 2015-10-10 MED FILL — DILTIAZEM 24HR ER 180 MG CA: 180 | 90 days supply | Qty: 90 | Fill #3

## 2015-10-21 MED FILL — ALPRAZolam 0.5 MG TABS: 0.5 | 20 days supply | Qty: 60 | Fill #0

## 2015-10-21 MED FILL — ZOLPIDEM TARTRATE 5 MG TAB: 5 | 30 days supply | Qty: 30 | Fill #0

## 2015-10-21 MED FILL — ESCITALOPRAM 10 MG TABLET: 10 | 30 days supply | Qty: 30 | Fill #0

## 2015-10-30 MED FILL — XARELTO 20 MG TABLET: 20 | 30 days supply | Qty: 30 | Fill #10 | Status: TO

## 2015-11-25 DIAGNOSIS — L84 Corns and callosities: Secondary | ICD-10-CM | POA: Diagnosis not present

## 2015-11-25 DIAGNOSIS — M79672 Pain in left foot: Secondary | ICD-10-CM | POA: Diagnosis not present

## 2015-12-11 DIAGNOSIS — R05 Cough: Secondary | ICD-10-CM | POA: Diagnosis not present

## 2015-12-11 DIAGNOSIS — J069 Acute upper respiratory infection, unspecified: Secondary | ICD-10-CM | POA: Diagnosis not present

## 2015-12-11 DIAGNOSIS — Z6824 Body mass index (BMI) 24.0-24.9, adult: Secondary | ICD-10-CM | POA: Diagnosis not present

## 2016-01-01 ENCOUNTER — Telehealth: Payer: Self-pay | Admitting: Cardiology

## 2016-01-01 MED ORDER — RIVAROXABAN 20 MG PO TABS: 20.0000 mg | ORAL_TABLET | Freq: Every day | ORAL | Status: DC

## 2016-01-01 NOTE — Telephone Encounter (Signed)
Pt see Dr Martinique on Monday. She will take her last Xarelto pill on Saturday.What does she need to do,she will be short of one pill before she see him?If not there please call-[phone].

## 2016-01-01 NOTE — Telephone Encounter (Signed)
Returned call to patient she stated she needed refill for Xarelto.Refill sent to pharmacy.

## 2016-01-05 ENCOUNTER — Encounter: Payer: Self-pay | Admitting: Cardiology

## 2016-01-05 ENCOUNTER — Ambulatory Visit (INDEPENDENT_AMBULATORY_CARE_PROVIDER_SITE_OTHER): Payer: PPO | Admitting: Cardiology

## 2016-01-05 VITALS — BP 118/80 | HR 69 | Ht 60.0 in | Wt 115.0 lb

## 2016-01-05 DIAGNOSIS — I480 Paroxysmal atrial fibrillation: Secondary | ICD-10-CM | POA: Diagnosis not present

## 2016-01-05 NOTE — Patient Instructions (Signed)
Continue your current therapy  I will see you in one year   

## 2016-01-05 NOTE — Progress Notes (Signed)
Alyssa Martin Date of Birth: 11/08/1945 Medical Record [id number]  History of Present Illness: Alyssa Martin is seen for follow up Afib. She has a history of paroxysmal atrial fibrillation dating back to 2013.  She has been on cardizem and Xarelto. Tolerating these well. She reports no Afib episodes for the last 6 months. She eliminated caffeine use. She is exercising regularly. She has been under a lot of stress with the passing of her husband earlier this year after a long battle with leukemia.   Current Outpatient Prescriptions on File Prior to Visit  Medication Sig Dispense Refill  . Calcium Citrate (CITRACAL PO) Take 630 mg by mouth 4 (four) times daily.    Marland Kitchen CARTIA XT 180 MG 24 hr capsule TAKE 1 CAPSULE BY MOUTH ONCE DAILY 90 capsule 3  . ergocalciferol (VITAMIN D2) 50000 UNITS capsule Take 50,000 Units by mouth every 14 (fourteen) days.    Marland Kitchen loratadine (CLARITIN) 10 MG tablet Take 10 mg by mouth as needed.    . mometasone (NASONEX) 50 MCG/ACT nasal spray Place 2 sprays into the nose daily.    . Multiple Vitamin (MULTIVITAMIN) tablet Take 1 tablet by mouth daily.    Marland Kitchen omeprazole (PRILOSEC) 20 MG capsule Take 20 mg by mouth daily as needed.     . rivaroxaban (XARELTO) 20 MG TABS tablet Take 1 tablet (20 mg total) by mouth daily with supper. 30 tablet 11  . simvastatin (ZOCOR) 20 MG tablet Take 20 mg by mouth every evening.    . zoledronic acid (RECLAST) 5 MG/100ML SOLN injection Inject 5 mg into the vein once. Yearly     No current facility-administered medications on file prior to visit.    Allergies  Allergen Reactions  . Penicillins     Past Medical History  Diagnosis Date  . Osteopenia   . Osteoporosis   . GERD (gastroesophageal reflux disease)   . Hyperlipidemia   . Impaired fasting glucose   . Vitamin D deficiency   . Elevated BP   . Depression with anxiety   . Paroxysmal atrial fibrillation [facility])     Past Surgical History  Procedure Laterality Date  .  Colonoscopy  05/10/2011  . Diagnostic mammogram  10 /09/2010  . Tibia fracture surgery  04/2006    Left--From falling down the stairs  . Tibia fracture surgery      In her 75's    History  Smoking status  . Never Smoker   Smokeless tobacco  . Not on file    History  Alcohol Use No    Family History  Problem Relation Age of Onset  . Hypertension Mother   . Osteoarthritis Mother   . Bone cancer Father   . Osteopenia Father   . Lung cancer Brother   . Lung cancer Paternal Grandmother   . Leukemia Paternal Grandfather   . Bone cancer Brother     Review of Systems: As noted in HPI. All other systems were reviewed and are negative.  Physical Exam: BP 118/80 mmHg  Pulse 69  Ht 5' (1.524 m)  Wt 52.164 kg (115 lb)  BMI 22.46 kg/m2 She is a pleasant, small frame white female in no acute distress.  The HEENT exam is normal.  The carotids are 2+ without bruits.  There is no thyromegaly.  There is no JVD.  The lungs are clear.   The heart exam reveals a regular rate and rhythm, normal S1 and S2.  There are no murmurs, gallops, or rubs.  The PMI is not displaced.   Abdominal exam reveals good bowel sounds.  Exam of the legs reveal no clubbing, cyanosis, or edema.   The distal pulses are intact.  Cranial nerves II - XII are intact.  Motor and sensory functions are intact.  The gait is normal.  LABORATORY DATA: Lab Results  Component Value Date   WBC 4.8 12/13/2014   HGB 14.4 12/13/2014   HCT 42.7 12/13/2014   PLT 252 12/13/2014   GLUCOSE 87 12/13/2014   NA 141 12/13/2014   K 4.2 12/13/2014   CL 105 12/13/2014   CREATININE 0.67 12/13/2014   BUN 17 12/13/2014   CO2 27 12/13/2014   Ecg today shows NSR with a normal Ecg. I have personally reviewed and interpreted this study.    Assessment / Plan: 1. Paroxysmal atrial fibrillation. Prior Echo showed mild MR and mild LAE. Currently asymptomatic. Good rate control on diltiazem. On Xarelto for anticoagulation. I will follow up in  one year.

## 2016-01-27 ENCOUNTER — Other Ambulatory Visit: Payer: Self-pay | Admitting: Cardiology

## 2016-01-27 NOTE — Telephone Encounter (Signed)
REFILL 

## 2016-03-08 ENCOUNTER — Telehealth: Payer: Self-pay | Admitting: Cardiology

## 2016-03-08 NOTE — Telephone Encounter (Signed)
New message     Pt c/o medication issue:  1. Name of Medication: Xarelto and Diltiazem  2. How are you currently taking this medication (dosage and times per day)? 20 mg and 180 mg  3. Are you having a reaction (difficulty breathing--STAT)? no  4. What is your medication issue? The pt wants to make sure if she can take a herbal supplement it called Tumernuic the pharmist said it is ok the pt is just making sure

## 2016-03-08 NOTE — Telephone Encounter (Signed)
Any concerns for patient to take Turmeric w cardiac meds? Routed to pharmD.

## 2016-03-08 NOTE — Telephone Encounter (Signed)
Turmeric does increase the risk of bleeding so in combination with Xarelto would be very mindful of signs/symptoms of bleeding. The turmeric does not interact with the medications though.

## 2016-03-09 ENCOUNTER — Telehealth: Payer: Self-pay | Admitting: Cardiology

## 2016-03-09 NOTE — Telephone Encounter (Signed)
Left msg to call.

## 2016-03-09 NOTE — Telephone Encounter (Signed)
Returned call to patient.[name] advice given.Stated she wanted to ask Dr.Jordan if ok to take tumeric to help with inflammation.Message sent to [name].

## 2016-03-09 NOTE — Telephone Encounter (Signed)
See previous 03/09/16 note.

## 2016-03-09 NOTE — Telephone Encounter (Signed)
F/u Message  Pt returning call. Please call back to advise

## 2016-03-10 NOTE — Telephone Encounter (Signed)
Tumeric can increase the risk of bleeding so there may be some concern on Xarelto. The magnitude of risk is unknown since this has never been studied. She would take at own risk. I cannot recommend.  Estalene Bergey Martinique MD, [facility]

## 2016-03-10 NOTE — Telephone Encounter (Signed)
Returned call to patient Dr.Jordan's advice given. 

## 2016-06-03 ENCOUNTER — Other Ambulatory Visit: Payer: Self-pay | Admitting: *Deleted

## 2016-06-03 MED ORDER — RIVAROXABAN 20 MG PO TABS: 20.0000 mg | ORAL_TABLET | Freq: Every day | ORAL | 0 refills | Status: DC

## 2016-06-18 DIAGNOSIS — N390 Urinary tract infection, site not specified: Secondary | ICD-10-CM | POA: Diagnosis not present

## 2016-06-18 DIAGNOSIS — E784 Other hyperlipidemia: Secondary | ICD-10-CM | POA: Diagnosis not present

## 2016-06-18 DIAGNOSIS — E559 Vitamin D deficiency, unspecified: Secondary | ICD-10-CM | POA: Diagnosis not present

## 2016-06-18 DIAGNOSIS — R7301 Impaired fasting glucose: Secondary | ICD-10-CM | POA: Diagnosis not present

## 2016-06-18 DIAGNOSIS — I10 Essential (primary) hypertension: Secondary | ICD-10-CM | POA: Diagnosis not present

## 2016-06-18 DIAGNOSIS — R8299 Other abnormal findings in urine: Secondary | ICD-10-CM | POA: Diagnosis not present

## 2016-06-25 DIAGNOSIS — E559 Vitamin D deficiency, unspecified: Secondary | ICD-10-CM | POA: Diagnosis not present

## 2016-06-25 DIAGNOSIS — Z0000 Encounter for general adult medical examination without abnormal findings: Secondary | ICD-10-CM | POA: Diagnosis not present

## 2016-06-25 DIAGNOSIS — Z1212 Encounter for screening for malignant neoplasm of rectum: Secondary | ICD-10-CM | POA: Diagnosis not present

## 2016-06-25 DIAGNOSIS — R3129 Other microscopic hematuria: Secondary | ICD-10-CM | POA: Diagnosis not present

## 2016-06-25 DIAGNOSIS — Z23 Encounter for immunization: Secondary | ICD-10-CM | POA: Diagnosis not present

## 2016-06-25 DIAGNOSIS — R7301 Impaired fasting glucose: Secondary | ICD-10-CM | POA: Diagnosis not present

## 2016-06-25 DIAGNOSIS — Z7901 Long term (current) use of anticoagulants: Secondary | ICD-10-CM | POA: Diagnosis not present

## 2016-06-25 DIAGNOSIS — M810 Age-related osteoporosis without current pathological fracture: Secondary | ICD-10-CM | POA: Diagnosis not present

## 2016-06-25 DIAGNOSIS — I10 Essential (primary) hypertension: Secondary | ICD-10-CM | POA: Diagnosis not present

## 2016-06-25 DIAGNOSIS — R312 Other microscopic hematuria: Secondary | ICD-10-CM | POA: Diagnosis not present

## 2016-06-25 DIAGNOSIS — I480 Paroxysmal atrial fibrillation: Secondary | ICD-10-CM | POA: Diagnosis not present

## 2016-06-25 DIAGNOSIS — Z1389 Encounter for screening for other disorder: Secondary | ICD-10-CM | POA: Diagnosis not present

## 2016-06-25 DIAGNOSIS — Z6822 Body mass index (BMI) 22.0-22.9, adult: Secondary | ICD-10-CM | POA: Diagnosis not present

## 2016-06-25 DIAGNOSIS — E784 Other hyperlipidemia: Secondary | ICD-10-CM | POA: Diagnosis not present

## 2016-06-28 ENCOUNTER — Telehealth: Payer: Self-pay | Admitting: Cardiology

## 2016-06-28 NOTE — Telephone Encounter (Signed)
Unable to locate recent encounter/reason for call. Routed to [place] to review.

## 2016-06-28 NOTE — Telephone Encounter (Signed)
New message       Pt had a missed call from [place].  Did you call her?

## 2016-06-29 NOTE — Telephone Encounter (Signed)
Returned call to patient.Advised I did not call her.Stated she is doing well she has lost 10 lbs.Stated her B/P has been low, PCP will be decreasing B/P medication.Advised to keep appointment with Dr.Jordan as planned.Advised to call sooner if needed.

## 2016-07-06 DIAGNOSIS — H01005 Unspecified blepharitis left lower eyelid: Secondary | ICD-10-CM | POA: Diagnosis not present

## 2016-07-06 DIAGNOSIS — H01004 Unspecified blepharitis left upper eyelid: Secondary | ICD-10-CM | POA: Diagnosis not present

## 2016-07-06 DIAGNOSIS — H00015 Hordeolum externum left lower eyelid: Secondary | ICD-10-CM | POA: Diagnosis not present

## 2016-07-09 ENCOUNTER — Ambulatory Visit (HOSPITAL_COMMUNITY)
Admission: RE | Admit: 2016-07-09 | Discharge: 2016-07-09 | Disposition: A | Discharge status: Home / Self Care | Payer: PPO | Source: Ambulatory Visit | Attending: Internal Medicine | Admitting: Internal Medicine

## 2016-07-13 DIAGNOSIS — H0015 Chalazion left lower eyelid: Secondary | ICD-10-CM | POA: Diagnosis not present

## 2016-07-23 ENCOUNTER — Encounter: Payer: Self-pay | Admitting: Internal Medicine

## 2016-07-27 DIAGNOSIS — Z01419 Encounter for gynecological examination (general) (routine) without abnormal findings: Secondary | ICD-10-CM | POA: Diagnosis not present

## 2016-07-27 DIAGNOSIS — Z6822 Body mass index (BMI) 22.0-22.9, adult: Secondary | ICD-10-CM | POA: Diagnosis not present

## 2016-07-27 DIAGNOSIS — Z1231 Encounter for screening mammogram for malignant neoplasm of breast: Secondary | ICD-10-CM | POA: Diagnosis not present

## 2016-07-27 DIAGNOSIS — Z124 Encounter for screening for malignant neoplasm of cervix: Secondary | ICD-10-CM | POA: Diagnosis not present

## 2016-07-28 DIAGNOSIS — H0015 Chalazion left lower eyelid: Secondary | ICD-10-CM | POA: Diagnosis not present

## 2016-08-05 ENCOUNTER — Other Ambulatory Visit: Payer: Self-pay | Admitting: Pharmacist

## 2016-08-05 MED ORDER — RIVAROXABAN 20 MG PO TABS: 20.0000 mg | ORAL_TABLET | Freq: Every day | ORAL | 5 refills | Status: DC

## 2016-08-23 DIAGNOSIS — R300 Dysuria: Secondary | ICD-10-CM | POA: Diagnosis not present

## 2016-08-23 DIAGNOSIS — E559 Vitamin D deficiency, unspecified: Secondary | ICD-10-CM | POA: Diagnosis not present

## 2016-08-23 DIAGNOSIS — N390 Urinary tract infection, site not specified: Secondary | ICD-10-CM | POA: Diagnosis not present

## 2016-08-23 DIAGNOSIS — I10 Essential (primary) hypertension: Secondary | ICD-10-CM | POA: Diagnosis not present

## 2016-09-10 ENCOUNTER — Ambulatory Visit (HOSPITAL_COMMUNITY)
Admission: RE | Admit: 2016-09-10 | Discharge: 2016-09-10 | Disposition: A | Discharge status: Home / Self Care | Payer: PPO | Source: Ambulatory Visit | Attending: Internal Medicine | Admitting: Internal Medicine

## 2016-09-10 ENCOUNTER — Encounter (HOSPITAL_COMMUNITY): Payer: Self-pay

## 2016-09-10 DIAGNOSIS — M810 Age-related osteoporosis without current pathological fracture: Secondary | ICD-10-CM | POA: Insufficient documentation

## 2016-09-10 MED ORDER — ZOLEDRONIC ACID 5 MG/100ML IV SOLN
5.0000 mg | Freq: Once | INTRAVENOUS | Status: AC
  Administered 2016-09-10: 5 mg via INTRAVENOUS
  Filled 2016-09-10: qty 100

## 2016-09-10 MED ORDER — SODIUM CHLORIDE 0.9 % IV SOLN
Freq: Once | INTRAVENOUS | Status: AC
  Administered 2016-09-10: 12:00:00 via INTRAVENOUS

## 2016-09-10 NOTE — Discharge Instructions (Signed)
Zoledronic Acid injection (Paget's Disease, Osteoporosis) °What is this medicine? °ZOLEDRONIC ACID (ZOE le dron ik AS id) lowers the amount of calcium loss from bone. It is used to treat Paget's disease and osteoporosis in women. °COMMON BRAND NAME(S): Reclast, Zometa °What should I tell my health care provider before I take this medicine? °They need to know if you have any of these conditions: °-aspirin-sensitive asthma °-cancer, especially if you are receiving medicines used to treat cancer °-dental disease or wear dentures °-infection °-kidney disease °-low levels of calcium in the blood °-past surgery on the parathyroid gland or intestines °-receiving corticosteroids like dexamethasone or prednisone °-an unusual or allergic reaction to zoledronic acid, other medicines, foods, dyes, or preservatives °-pregnant or trying to get pregnant °-breast-feeding °How should I use this medicine? °This medicine is for infusion into a vein. It is given by a health care professional in a hospital or clinic setting. °Talk to your pediatrician regarding the use of this medicine in children. This medicine is not approved for use in children. °What if I miss a dose? °It is important not to miss your dose. Call your doctor or health care professional if you are unable to keep an appointment. °What may interact with this medicine? °-certain antibiotics given by injection °-NSAIDs, medicines for pain and inflammation, like ibuprofen or naproxen °-some diuretics like bumetanide, furosemide °-teriparatide °What should I watch for while using this medicine? °Visit your doctor or health care professional for regular checkups. It may be some time before you see the benefit from this medicine. Do not stop taking your medicine unless your doctor tells you to. Your doctor may order blood tests or other tests to see how you are doing. °Women should inform their doctor if they wish to become pregnant or think they might be pregnant. There is a  potential for serious side effects to an unborn child. Talk to your health care professional or pharmacist for more information. °You should make sure that you get enough calcium and vitamin D while you are taking this medicine. Discuss the foods you eat and the vitamins you take with your health care professional. °Some people who take this medicine have severe bone, joint, and/or muscle pain. This medicine may also increase your risk for jaw problems or a broken thigh bone. Tell your doctor right away if you have severe pain in your jaw, bones, joints, or muscles. Tell your doctor if you have any pain that does not go away or that gets worse. °Tell your dentist and dental surgeon that you are taking this medicine. You should not have major dental surgery while on this medicine. See your dentist to have a dental exam and fix any dental problems before starting this medicine. Take good care of your teeth while on this medicine. Make sure you see your dentist for regular follow-up appointments. °What side effects may I notice from receiving this medicine? °Side effects that you should report to your doctor or health care professional as soon as possible: °-allergic reactions like skin rash, itching or hives, swelling of the face, lips, or tongue °-anxiety, confusion, or depression °-breathing problems °-changes in vision °-eye pain °-feeling faint or lightheaded, falls °-jaw pain, especially after dental work °-mouth sores °-muscle cramps, stiffness, or weakness °-redness, blistering, peeling or loosening of the skin, including inside the mouth °-trouble passing urine or change in the amount of urine °Side effects that usually do not require medical attention (report to your doctor or health care professional if   they continue or are bothersome): °-bone, joint, or muscle pain °-constipation °-diarrhea °-fever °-hair loss °-irritation at site where injected °-loss of appetite °-nausea, vomiting °-stomach  upset °-trouble sleeping °-trouble swallowing °-weak or tired °Where should I keep my medicine? °This drug is given in a hospital or clinic and will not be stored at home. °© 2017 [employer] (2014-02-02 14:19:57) ° °

## 2016-09-10 NOTE — Progress Notes (Signed)
Uneventful Reclast infusion. Instructed to call Dr. Brigitte Pulse for problems and concerns once discharge from Short Stay. Ms. Julson verbalized understanding.

## 2016-11-16 ENCOUNTER — Other Ambulatory Visit: Payer: Self-pay | Admitting: Pharmacist

## 2016-11-16 NOTE — Telephone Encounter (Signed)
Waiting for copy of BMEt and CBC form PCP Dr Brigitte Pulse

## 2016-11-17 MED ORDER — RIVAROXABAN 20 MG PO TABS: 20.0000 mg | ORAL_TABLET | Freq: Every day | ORAL | 0 refills | Status: DC

## 2016-12-08 NOTE — Progress Notes (Signed)
Alyssa Martin Date of Birth: 1946-06-11 Medical Record #[id number]  History of Present Illness: Alyssa Martin is seen for follow up Afib. She has a history of paroxysmal atrial fibrillation dating back to 2013.  She has been on cardizem and Xarelto. She is tolerating these well. She reports no Afib episodes for the last year.  She is exercising regularly- walking helps with the grief of losing her husband to leukemia.   Current Outpatient Prescriptions on File Prior to Visit  Medication Sig Dispense Refill  . Calcium Citrate (CITRACAL PO) Take 630 mg by mouth 4 (four) times daily.    Marland Kitchen DILT-XR 180 MG 24 hr capsule TAKE 1 CAPSULE BY MOUTH EVERY DAY 90 capsule 3  . ergocalciferol (VITAMIN D2) 50000 UNITS capsule Take 50,000 Units by mouth every 14 (fourteen) days.    Marland Kitchen loratadine (CLARITIN) 10 MG tablet Take 10 mg by mouth as needed.    . mometasone (NASONEX) 50 MCG/ACT nasal spray Place 2 sprays into the nose daily.    . Multiple Vitamin (MULTIVITAMIN) tablet Take 1 tablet by mouth daily.    Marland Kitchen omeprazole (PRILOSEC) 20 MG capsule Take 20 mg by mouth daily as needed.     . rivaroxaban (XARELTO) 20 MG TABS tablet Take 1 tablet (20 mg total) by mouth daily with supper. 90 tablet 0  . simvastatin (ZOCOR) 20 MG tablet Take 20 mg by mouth every evening.    . vitamin C (ASCORBIC ACID) 500 MG tablet Take 500 mg by mouth daily.    . zoledronic acid (RECLAST) 5 MG/100ML SOLN injection Inject 5 mg into the vein once. Yearly     No current facility-administered medications on file prior to visit.     Allergies  Allergen Reactions  . Penicillins     Past Medical History:  Diagnosis Date  . Depression with anxiety   . Elevated BP   . GERD (gastroesophageal reflux disease)   . Hyperlipidemia   . Impaired fasting glucose   . Osteopenia   . Osteoporosis   . Paroxysmal atrial fibrillation (HCC)   . Vitamin D deficiency     Past Surgical History:  Procedure Laterality Date  . COLONOSCOPY   05/10/2011  . DIAGNOSTIC MAMMOGRAM  10 /09/2010  . TIBIA FRACTURE SURGERY  04/2006   Left--From falling down the stairs  . TIBIA FRACTURE SURGERY     In her 65's    History  Smoking Status  . Never Smoker  Smokeless Tobacco  . Never Used    History  Alcohol Use No    Family History  Problem Relation Age of Onset  . Hypertension Mother   . Osteoarthritis Mother   . Bone cancer Father   . Osteopenia Father   . Lung cancer Brother   . Lung cancer Paternal Grandmother   . Leukemia Paternal Grandfather   . Bone cancer Brother     Review of Systems: As noted in HPI. All other systems were reviewed and are negative.  Physical Exam: BP 124/76   Pulse 61   Ht 5' (1.524 m)   Wt 108 lb (49 kg)   BMI 21.09 kg/m  She is a pleasant, small frame white female in no acute distress.  The HEENT exam is normal.  The carotids are 2+ without bruits.  There is no thyromegaly.  There is no JVD.  The lungs are clear.   The heart exam reveals a regular rate and rhythm, normal S1 and S2.  There are no murmurs,  gallops, or rubs.  The PMI is not displaced.   Abdominal exam reveals good bowel sounds.  Exam of the legs reveal no clubbing, cyanosis, or edema.   The distal pulses are intact.  Cranial nerves II - XII are intact.  Motor and sensory functions are intact.  The gait is normal.  LABORATORY DATA: Lab Results  Component Value Date   WBC 4.8 12/13/2014   HGB 14.4 12/13/2014   HCT 42.7 12/13/2014   PLT 252 12/13/2014   GLUCOSE 87 12/13/2014   NA 141 12/13/2014   K 4.2 12/13/2014   CL 105 12/13/2014   CREATININE 0.67 12/13/2014   BUN 17 12/13/2014   CO2 27 12/13/2014   Labs dated 06/18/16: cholesterol 140, triglycerides 68, HDL 50, LDL 76. A1c 5.3%. CMET and CBC normal.   Ecg today shows NSR with a normal Ecg. Rate 61. I have personally reviewed and interpreted this study.    Assessment / Plan: 1. Paroxysmal atrial fibrillation.  Currently asymptomatic. Good rate control on  diltiazem with minimal breakthrough. On Xarelto for anticoagulation. I will follow up in one year.

## 2016-12-10 ENCOUNTER — Ambulatory Visit (INDEPENDENT_AMBULATORY_CARE_PROVIDER_SITE_OTHER): Payer: PPO | Admitting: Cardiology

## 2016-12-10 ENCOUNTER — Encounter: Payer: Self-pay | Admitting: Cardiology

## 2016-12-10 VITALS — BP 124/76 | HR 61 | Ht 60.0 in | Wt 108.0 lb

## 2016-12-10 DIAGNOSIS — I480 Paroxysmal atrial fibrillation: Secondary | ICD-10-CM | POA: Diagnosis not present

## 2016-12-10 NOTE — Patient Instructions (Signed)
Continue your current therapy  I will see you in one year   

## 2017-01-27 ENCOUNTER — Other Ambulatory Visit: Payer: Self-pay | Admitting: Cardiology

## 2017-01-28 NOTE — Telephone Encounter (Signed)
REFILL 

## 2017-04-22 DIAGNOSIS — H52203 Unspecified astigmatism, bilateral: Secondary | ICD-10-CM | POA: Diagnosis not present

## 2017-04-22 DIAGNOSIS — H25013 Cortical age-related cataract, bilateral: Secondary | ICD-10-CM | POA: Diagnosis not present

## 2017-05-17 ENCOUNTER — Other Ambulatory Visit: Payer: Self-pay | Admitting: Pharmacist Clinician (PhC)/ Clinical Pharmacy Specialist

## 2017-05-17 MED ORDER — RIVAROXABAN 20 MG PO TABS: 20.0000 mg | ORAL_TABLET | Freq: Every day | ORAL | 1 refills | Status: DC

## 2017-05-25 DIAGNOSIS — D225 Melanocytic nevi of trunk: Secondary | ICD-10-CM | POA: Diagnosis not present

## 2017-05-25 DIAGNOSIS — L738 Other specified follicular disorders: Secondary | ICD-10-CM | POA: Diagnosis not present

## 2017-05-25 DIAGNOSIS — D1801 Hemangioma of skin and subcutaneous tissue: Secondary | ICD-10-CM | POA: Diagnosis not present

## 2017-05-25 DIAGNOSIS — I788 Other diseases of capillaries: Secondary | ICD-10-CM | POA: Diagnosis not present

## 2017-06-29 DIAGNOSIS — M810 Age-related osteoporosis without current pathological fracture: Secondary | ICD-10-CM | POA: Diagnosis not present

## 2017-06-29 DIAGNOSIS — E7849 Other hyperlipidemia: Secondary | ICD-10-CM | POA: Diagnosis not present

## 2017-06-29 DIAGNOSIS — Z0000 Encounter for general adult medical examination without abnormal findings: Secondary | ICD-10-CM | POA: Diagnosis not present

## 2017-06-29 DIAGNOSIS — I10 Essential (primary) hypertension: Secondary | ICD-10-CM | POA: Diagnosis not present

## 2017-07-01 DIAGNOSIS — I788 Other diseases of capillaries: Secondary | ICD-10-CM | POA: Diagnosis not present

## 2017-07-01 DIAGNOSIS — Z419 Encounter for procedure for purposes other than remedying health state, unspecified: Secondary | ICD-10-CM | POA: Diagnosis not present

## 2017-07-06 DIAGNOSIS — E7849 Other hyperlipidemia: Secondary | ICD-10-CM | POA: Diagnosis not present

## 2017-07-06 DIAGNOSIS — Z7901 Long term (current) use of anticoagulants: Secondary | ICD-10-CM | POA: Diagnosis not present

## 2017-07-06 DIAGNOSIS — Z23 Encounter for immunization: Secondary | ICD-10-CM | POA: Diagnosis not present

## 2017-07-06 DIAGNOSIS — Z6823 Body mass index (BMI) 23.0-23.9, adult: Secondary | ICD-10-CM | POA: Diagnosis not present

## 2017-07-06 DIAGNOSIS — R3129 Other microscopic hematuria: Secondary | ICD-10-CM | POA: Diagnosis not present

## 2017-07-06 DIAGNOSIS — I480 Paroxysmal atrial fibrillation: Secondary | ICD-10-CM | POA: Diagnosis not present

## 2017-07-06 DIAGNOSIS — Z0000 Encounter for general adult medical examination without abnormal findings: Secondary | ICD-10-CM | POA: Diagnosis not present

## 2017-07-06 DIAGNOSIS — I10 Essential (primary) hypertension: Secondary | ICD-10-CM | POA: Diagnosis not present

## 2017-07-06 DIAGNOSIS — N952 Postmenopausal atrophic vaginitis: Secondary | ICD-10-CM | POA: Diagnosis not present

## 2017-07-06 DIAGNOSIS — R7301 Impaired fasting glucose: Secondary | ICD-10-CM | POA: Diagnosis not present

## 2017-07-06 DIAGNOSIS — E559 Vitamin D deficiency, unspecified: Secondary | ICD-10-CM | POA: Diagnosis not present

## 2017-07-06 DIAGNOSIS — M810 Age-related osteoporosis without current pathological fracture: Secondary | ICD-10-CM | POA: Diagnosis not present

## 2017-07-30 ENCOUNTER — Other Ambulatory Visit: Payer: Self-pay | Admitting: Cardiology

## 2017-08-09 DIAGNOSIS — R319 Hematuria, unspecified: Secondary | ICD-10-CM | POA: Diagnosis not present

## 2017-08-09 DIAGNOSIS — Z1231 Encounter for screening mammogram for malignant neoplasm of breast: Secondary | ICD-10-CM | POA: Diagnosis not present

## 2017-08-09 DIAGNOSIS — Z01419 Encounter for gynecological examination (general) (routine) without abnormal findings: Secondary | ICD-10-CM | POA: Diagnosis not present

## 2017-08-09 DIAGNOSIS — Z6822 Body mass index (BMI) 22.0-22.9, adult: Secondary | ICD-10-CM | POA: Diagnosis not present

## 2017-08-12 ENCOUNTER — Emergency Department (HOSPITAL_COMMUNITY): Payer: PPO

## 2017-08-12 ENCOUNTER — Emergency Department (HOSPITAL_COMMUNITY)
Admission: EM | Admit: 2017-08-12 | Discharge: 2017-08-13 | Disposition: A | Discharge status: Home / Self Care | Payer: PPO | Attending: Emergency Medicine | Admitting: Emergency Medicine

## 2017-08-12 ENCOUNTER — Encounter (HOSPITAL_COMMUNITY): Payer: Self-pay

## 2017-08-12 ENCOUNTER — Other Ambulatory Visit: Payer: Self-pay

## 2017-08-12 DIAGNOSIS — Y9301 Activity, walking, marching and hiking: Secondary | ICD-10-CM | POA: Diagnosis not present

## 2017-08-12 DIAGNOSIS — S42212A Unspecified displaced fracture of surgical neck of left humerus, initial encounter for closed fracture: Secondary | ICD-10-CM | POA: Insufficient documentation

## 2017-08-12 DIAGNOSIS — S8261XA Displaced fracture of lateral malleolus of right fibula, initial encounter for closed fracture: Secondary | ICD-10-CM | POA: Insufficient documentation

## 2017-08-12 DIAGNOSIS — M25571 Pain in right ankle and joints of right foot: Secondary | ICD-10-CM | POA: Diagnosis not present

## 2017-08-12 DIAGNOSIS — S4992XA Unspecified injury of left shoulder and upper arm, initial encounter: Secondary | ICD-10-CM | POA: Diagnosis present

## 2017-08-12 DIAGNOSIS — M542 Cervicalgia: Secondary | ICD-10-CM | POA: Insufficient documentation

## 2017-08-12 DIAGNOSIS — Y999 Unspecified external cause status: Secondary | ICD-10-CM | POA: Diagnosis not present

## 2017-08-12 DIAGNOSIS — W0110XA Fall on same level from slipping, tripping and stumbling with subsequent striking against unspecified object, initial encounter: Secondary | ICD-10-CM | POA: Diagnosis not present

## 2017-08-12 DIAGNOSIS — Z79899 Other long term (current) drug therapy: Secondary | ICD-10-CM | POA: Insufficient documentation

## 2017-08-12 DIAGNOSIS — S0990XA Unspecified injury of head, initial encounter: Secondary | ICD-10-CM | POA: Diagnosis not present

## 2017-08-12 DIAGNOSIS — I10 Essential (primary) hypertension: Secondary | ICD-10-CM | POA: Insufficient documentation

## 2017-08-12 DIAGNOSIS — Y929 Unspecified place or not applicable: Secondary | ICD-10-CM | POA: Diagnosis not present

## 2017-08-12 DIAGNOSIS — S199XXA Unspecified injury of neck, initial encounter: Secondary | ICD-10-CM | POA: Diagnosis not present

## 2017-08-12 DIAGNOSIS — S42302A Unspecified fracture of shaft of humerus, left arm, initial encounter for closed fracture: Secondary | ICD-10-CM | POA: Diagnosis not present

## 2017-08-12 HISTORY — DX: Essential (primary) hypertension: I10

## 2017-08-12 LAB — URINALYSIS, ROUTINE W REFLEX MICROSCOPIC
BILIRUBIN URINE: NEGATIVE
Glucose, UA: NEGATIVE mg/dL
KETONES UR: NEGATIVE mg/dL
Nitrite: NEGATIVE
PH: 5 (ref 5.0–8.0)
Protein, ur: NEGATIVE mg/dL
SPECIFIC GRAVITY, URINE: 1.016 (ref 1.005–1.030)

## 2017-08-12 LAB — BASIC METABOLIC PANEL
ANION GAP: 7 (ref 5–15)
BUN: 17 mg/dL (ref 6–20)
CHLORIDE: 107 mmol/L (ref 101–111)
CO2: 24 mmol/L (ref 22–32)
Calcium: 9 mg/dL (ref 8.9–10.3)
Creatinine, Ser: 0.59 mg/dL (ref 0.44–1.00)
Glucose, Bld: 117 mg/dL — ABNORMAL HIGH (ref 65–99)
POTASSIUM: 3.6 mmol/L (ref 3.5–5.1)
SODIUM: 138 mmol/L (ref 135–145)

## 2017-08-12 LAB — CBC WITH DIFFERENTIAL/PLATELET
BASOS ABS: 0 10*3/uL (ref 0.0–0.1)
Basophils Relative: 0 %
EOS PCT: 0 %
Eosinophils Absolute: 0 10*3/uL (ref 0.0–0.7)
HCT: 40.2 % (ref 36.0–46.0)
HEMOGLOBIN: 13.2 g/dL (ref 12.0–15.0)
LYMPHS PCT: 11 %
Lymphs Abs: 0.9 10*3/uL (ref 0.7–4.0)
MCH: 29.9 pg (ref 26.0–34.0)
MCHC: 32.8 g/dL (ref 30.0–36.0)
MCV: 91.2 fL (ref 78.0–100.0)
Monocytes Absolute: 0.4 10*3/uL (ref 0.1–1.0)
Monocytes Relative: 4 %
NEUTROS ABS: 6.8 10*3/uL (ref 1.7–7.7)
NEUTROS PCT: 85 %
PLATELETS: 249 10*3/uL (ref 150–400)
RBC: 4.41 MIL/uL (ref 3.87–5.11)
RDW: 13.5 % (ref 11.5–15.5)
WBC: 8.1 10*3/uL (ref 4.0–10.5)

## 2017-08-12 MED ORDER — FENTANYL CITRATE (PF) 100 MCG/2ML IJ SOLN: 25.0000 ug | Freq: Once | INTRAMUSCULAR | Status: DC

## 2017-08-12 MED ORDER — HYDROCODONE-ACETAMINOPHEN 5-325 MG PO TABS: 1.0000 | ORAL_TABLET | Freq: Four times a day (QID) | ORAL | 0 refills | Status: DC | PRN

## 2017-08-12 MED ORDER — ONDANSETRON HCL 4 MG/2ML IJ SOLN
4.0000 mg | Freq: Once | INTRAMUSCULAR | Status: AC
  Administered 2017-08-12: 4 mg via INTRAVENOUS
  Filled 2017-08-12: qty 2

## 2017-08-12 MED ORDER — FENTANYL CITRATE (PF) 100 MCG/2ML IJ SOLN
25.0000 ug | Freq: Once | INTRAMUSCULAR | Status: AC
  Administered 2017-08-12: 25 ug via INTRAVENOUS
  Filled 2017-08-12: qty 2

## 2017-08-12 MED ORDER — HYDROCODONE-ACETAMINOPHEN 5-325 MG PO TABS
0.5000 | ORAL_TABLET | Freq: Once | ORAL | Status: AC
  Administered 2017-08-12: 0.5 via ORAL
  Filled 2017-08-12: qty 1

## 2017-08-12 MED ORDER — ACETAMINOPHEN 325 MG PO TABS
650.0000 mg | ORAL_TABLET | Freq: Once | ORAL | Status: AC
  Administered 2017-08-12: 650 mg via ORAL
  Filled 2017-08-12: qty 2

## 2017-08-12 NOTE — Progress Notes (Signed)
CSW met with pt and daughter.  Pt states she has Medicare A&B although chart does not reflect this.  CSW asked if pt had any social work needs and pt and family at bedside stated no.    CSW suggested pt and family calling a private duty in-home aide company and paying for services in the home either partially or around the clock.  Family and pt stated they will consider this.  Please reconsult if future social work needs arise.  CSW signing off, as social work intervention is no longer needed.  Alphonse Guild. Dempsy Damiano, LCSW, LCAS, CSI Clinical Social Worker Ph: [phone]

## 2017-08-12 NOTE — ED Notes (Signed)
CSW at bedside.

## 2017-08-12 NOTE — ED Notes (Signed)
Patient transported to X-ray 

## 2017-08-12 NOTE — ED Notes (Signed)
[facility] notified of need for transport to home 

## 2017-08-12 NOTE — ED Triage Notes (Signed)
Patient was walking in the park and rolled her right ankle and then rolled over on her left shoulder. Patient c/o pain of both.

## 2017-08-12 NOTE — Care Management Note (Signed)
Case Management Note  CM consulted for a cane for pt.  CM spoke with Hina, PA and advised that due to the time of day CM would follow up with the DME providers on Monday and they would mail the cane to the pt.  EDP advised to not follow up or have cane mailed.  No further CM needs advised by EDP.

## 2017-08-12 NOTE — ED Notes (Addendum)
Pt walkedwith cam walker and crutches, pt had some difficulty due to pain in the shoulder with crutches and became very dizzy/nauseated r/t pain medication.

## 2017-08-12 NOTE — ED Provider Notes (Signed)
[facility] DEPT Provider Note   CSN: [id number] Arrival date & time: 08/12/17  1415     History   Chief Complaint Chief Complaint  Patient presents with  . Fall  . Shoulder Injury    left  . Ankle Injury    right    HPI Alyssa Martin is a 71 y.o. female with a past medical history of HTN, A.fib on Xarelto, osteoporosis, presents to ED for evaluation of R ankle pain and L shoulder pain after fall prior to arrival. She was walking her dog in the park when she tripped and fell, rolled her ankle and landed on her L shoulder. Since then she has been able to bear weight with pain on the R ankle. However, she has held her shoulder/elbow in a flexed position close to her body since the accident. She denies any previous fracture, dislocation or procedure in either joint in the past. She denies any head injury, loss of consciousness, vision changes, numbness in legs.  HPI  Past Medical History:  Diagnosis Date  . Depression with anxiety   . Elevated BP   . GERD (gastroesophageal reflux disease)   . Hyperlipidemia   . Hypertension   . Impaired fasting glucose   . Osteopenia   . Osteoporosis   . Paroxysmal atrial fibrillation (HCC)   . Vitamin D deficiency     Patient Active Problem List   Diagnosis Date Noted  . Paroxysmal A-fib ([place]) 09/08/2012    Past Surgical History:  Procedure Laterality Date  . COLONOSCOPY  05/10/2011  . DIAGNOSTIC MAMMOGRAM  10 /09/2010  . TIBIA FRACTURE SURGERY  04/2006   Left--From falling down the stairs  . TIBIA FRACTURE SURGERY     In her 73's    OB History    No data available       Home Medications    Prior to Admission medications   Medication Sig Start Date End Date Taking? Authorizing Provider  Calcium Citrate (CITRACAL PO) Take 630 mg by mouth 2 (two) times daily.    Yes [provider]  DILT-XR 180 MG 24 hr capsule TAKE 1 CAPSULE BY MOUTH EVERY DAY 08/01/17  Yes Martinique, Peter M, MD    ergocalciferol (VITAMIN D2) 50000 UNITS capsule Take 50,000 Units by mouth every 14 (fourteen) days.   Yes [provider]  mometasone (NASONEX) 50 MCG/ACT nasal spray Place 2 sprays into the nose daily as needed (allergies).    Yes [provider]  Multiple Vitamin (MULTIVITAMIN) tablet Take 1 tablet by mouth daily.   Yes [provider]  PREMARIN vaginal cream APP 1 GRAM VAGINALLY 2 TIMES A WK 07/06/17  Yes [provider]  ranitidine (ZANTAC) 150 MG tablet Take 150 mg by mouth 2 (two) times daily as needed for heartburn.   Yes [provider]  rivaroxaban (XARELTO) 20 MG TABS tablet Take 1 tablet (20 mg total) by mouth daily with supper. 05/17/17  Yes Martinique, Peter M, MD  simvastatin (ZOCOR) 20 MG tablet Take 20 mg by mouth every evening.   Yes [provider]  vitamin C (ASCORBIC ACID) 500 MG tablet Take 500 mg by mouth daily.   Yes [provider]  HYDROcodone-acetaminophen (NORCO/VICODIN) 5-325 MG tablet Take 1 tablet by mouth every 6 (six) hours as needed for severe pain. 08/12/17   Shanya Ferriss, PA-C  loratadine (CLARITIN) 10 MG tablet Take 10 mg by mouth daily as needed for allergies.     [provider]  nitrofurantoin (MACRODANTIN) 100 MG capsule TK ONE C PO  Q 12 H D 08/09/17   [provider]  omeprazole (PRILOSEC) 20 MG capsule Take 20 mg by mouth daily as needed (heartburn).     [provider]  zoledronic acid (RECLAST) 5 MG/100ML SOLN injection Inject 5 mg into the vein once. Yearly    [provider]    Family History Family History  Problem Relation Age of Onset  . Hypertension Mother   . Osteoarthritis Mother   . Bone cancer Father   . Osteopenia Father   . Lung cancer Brother   . Lung cancer Paternal Grandmother   . Leukemia Paternal Grandfather   . Bone cancer Brother     Social History Social History   Tobacco Use  . Smoking status: Never Smoker  . Smokeless  tobacco: Never Used  Substance Use Topics  . Alcohol use: No    Alcohol/week: 0.0 oz  . Drug use: No     Allergies   Penicillins   Review of Systems Review of Systems  Constitutional: Negative for appetite change, chills and fever.  HENT: Negative for ear pain, rhinorrhea, sneezing and sore throat.   Eyes: Negative for photophobia and visual disturbance.  Respiratory: Negative for cough, chest tightness, shortness of breath and wheezing.   Cardiovascular: Negative for chest pain and palpitations.  Gastrointestinal: Negative for abdominal pain, blood in stool, constipation, diarrhea, nausea and vomiting.  Genitourinary: Negative for dysuria, hematuria and urgency.  Musculoskeletal: Positive for arthralgias, joint swelling and myalgias.  Skin: Negative for rash.  Neurological: Negative for dizziness, syncope, weakness, light-headedness and headaches.     Physical Exam Updated Vital Signs BP 112/67   Pulse 61   Temp 97.8 F (36.6 C) (Oral)   Resp 18   Ht '4\' 11"'  (1.499 m)   Wt 50.8 kg (112 lb)   SpO2 98%   BMI 22.62 kg/m   Physical Exam  Constitutional: She appears well-developed and well-nourished. No distress.  HENT:  Head: Normocephalic and atraumatic.  Nose: Nose normal.  Eyes: Conjunctivae and EOM are normal. Left eye exhibits no discharge. No scleral icterus.  Neck: Normal range of motion. Neck supple.  Cardiovascular: Normal rate, regular rhythm, normal heart sounds and intact distal pulses. Exam reveals no gallop and no friction rub.  No murmur heard. Pulmonary/Chest: Effort normal and breath sounds normal. No respiratory distress.  Abdominal: Soft. Bowel sounds are normal. She exhibits no distension. There is no tenderness. There is no guarding.  Musculoskeletal: Normal range of motion. She exhibits edema, tenderness and deformity.  Tenderness to palpation of the left shoulder and lateral malleolus of the right ankle.  There is edema noted around the lateral  malleolus of the right ankle with no visible deformity.  Deformity noted of left shoulder.  Unable to perform range of motion exercises secondary to pain.  Arm is held in a flexed position close to her body.  Neurological: She is alert. She exhibits normal muscle tone. Coordination normal.  Skin: Skin is warm and dry. No rash noted.  Psychiatric: She has a normal mood and affect.  Nursing note and vitals reviewed.    ED Treatments / Results  Labs (all labs ordered are listed, but only abnormal results are displayed) Labs Reviewed  BASIC METABOLIC PANEL - Abnormal; Notable for the following components:      Result Value   Glucose, Bld 117 (*)    All other components within normal limits  URINALYSIS,  ROUTINE W REFLEX MICROSCOPIC - Abnormal; Notable for the following components:   Color, Urine AMBER (*)    Hgb urine dipstick SMALL (*)    Leukocytes, UA TRACE (*)    Bacteria, UA RARE (*)    Squamous Epithelial / LPF 0-5 (*)    All other components within normal limits  URINE CULTURE  CBC WITH DIFFERENTIAL/PLATELET    EKG  EKG Interpretation None       Radiology Dg Ankle Complete Right  Result Date: 08/12/2017 CLINICAL DATA:  Lateral right ankle pain since an injury due to a fall today. Initial encounter. EXAM: RIGHT ANKLE - COMPLETE 3+ VIEW COMPARISON:  None. FINDINGS: The patient has a fracture of the lateral malleolus. There is a small distracted fracture fragment off the tip of the lateral malleolus. On the oblique view, nondisplaced fracture line appears to extend cephalad to the level of the tibiotalar joint. No other acute bone abnormality is identified. Soft tissue swelling about the lateral malleolus and ankle joint effusion noted. Calcaneal spur is identified. IMPRESSION: Acute lateral malleolus fracture as described above. Electronically Signed   By: Inge Rise M.D.   On: 08/12/2017 15:13   Ct Head Wo Contrast  Result Date: 08/12/2017 CLINICAL DATA:  Fall while  walking. No head injury. Patient does have neck pain. EXAM: CT HEAD WITHOUT CONTRAST CT CERVICAL SPINE WITHOUT CONTRAST TECHNIQUE: Multidetector CT imaging of the head and cervical spine was performed following the standard protocol without intravenous contrast. Multiplanar CT image reconstructions of the cervical spine were also generated. COMPARISON:  None. FINDINGS: CT HEAD FINDINGS Brain: No evidence of acute infarction, hemorrhage, hydrocephalus, extra-axial collection or mass lesion/mass effect. Vascular: No hyperdense vessel or unexpected calcification. Skull: Normal. Negative for fracture or focal lesion. Sinuses/Orbits: No acute finding. Other: None. CT CERVICAL SPINE FINDINGS Alignment: Normal. Skull base and vertebrae: No acute fracture. No primary bone lesion or focal pathologic process. Mild spondylosis is present to include facet arthropathy. Soft tissues and spinal canal: No prevertebral fluid or swelling. No visible canal hematoma. Disc levels:  Within normal. Upper chest: No acute findings. Other: None. IMPRESSION: No acute intracranial findings. No acute cervical spine injury. Mild spondylosis of the cervical spine. Electronically Signed   By: Marin Olp M.D.   On: 08/12/2017 16:08   Ct Cervical Spine Wo Contrast  Result Date: 08/12/2017 CLINICAL DATA:  Fall while walking. No head injury. Patient does have neck pain. EXAM: CT HEAD WITHOUT CONTRAST CT CERVICAL SPINE WITHOUT CONTRAST TECHNIQUE: Multidetector CT imaging of the head and cervical spine was performed following the standard protocol without intravenous contrast. Multiplanar CT image reconstructions of the cervical spine were also generated. COMPARISON:  None. FINDINGS: CT HEAD FINDINGS Brain: No evidence of acute infarction, hemorrhage, hydrocephalus, extra-axial collection or mass lesion/mass effect. Vascular: No hyperdense vessel or unexpected calcification. Skull: Normal. Negative for fracture or focal lesion. Sinuses/Orbits:  No acute finding. Other: None. CT CERVICAL SPINE FINDINGS Alignment: Normal. Skull base and vertebrae: No acute fracture. No primary bone lesion or focal pathologic process. Mild spondylosis is present to include facet arthropathy. Soft tissues and spinal canal: No prevertebral fluid or swelling. No visible canal hematoma. Disc levels:  Within normal. Upper chest: No acute findings. Other: None. IMPRESSION: No acute intracranial findings. No acute cervical spine injury. Mild spondylosis of the cervical spine. Electronically Signed   By: Marin Olp M.D.   On: 08/12/2017 16:08   Dg Shoulder Left  Result Date: 08/12/2017 CLINICAL DATA:  Fall today  while walking landing on left shoulder. EXAM: LEFT SHOULDER - 2+ VIEW COMPARISON:  None. FINDINGS: Examination demonstrates a displaced slightly comminuted humeral neck fracture extending to the humeral head to the region of the greater tuberosity. Remainder of the exam is unremarkable. IMPRESSION: Displaced humeral neck fracture with mild comminution extending into the humeral head. Electronically Signed   By: Marin Olp M.D.   On: 08/12/2017 15:14    Procedures Procedures (including critical care time)  Medications Ordered in ED Medications  HYDROcodone-acetaminophen (NORCO/VICODIN) 5-325 MG per tablet 0.5 tablet (not administered)  fentaNYL (SUBLIMAZE) injection 25 mcg (25 mcg Intravenous Given 08/12/17 1700)  ondansetron (ZOFRAN) injection 4 mg (4 mg Intravenous Given 08/12/17 1700)  acetaminophen (TYLENOL) tablet 650 mg (650 mg Oral Given 08/12/17 1953)  ondansetron (ZOFRAN) injection 4 mg (4 mg Intravenous Given 08/12/17 2119)     Initial Impression / Assessment and Plan / ED Course  I have reviewed the triage vital signs and the nursing notes.  Pertinent labs & imaging results that were available during my care of the patient were reviewed by me and considered in my medical decision making (see chart for details).     Patient presents  to ED for evaluation of L shoulder pain and R ankle pain after mechanical fall prior to arrival. She as walking her dog in the park when she tripped and fell and landed on her left shoulder. She denies any head injury or loss of consciousness. On physical exam there is tenderness to palpation of the shoulder and ankle. She has pain with ROM of the shoulder and it is held in a flexed position with minor deformity noted. No open fracture noted. X-ray of shoulder showed humeral fracture. X-ray of ankle was positive for acute lateral malleolus fracture.  CT of head and neck return as negative.  CBC and BMP unremarkable.  Patient given pain medication here and reports pain is under control.  I did speak to the orthopedist who recommends that we place patient in a sling, place her leg in a Cam walker and advised her to walk with either one crutch or a cane.  Kasandra Knudsen was unavailable in this facility at this time.  Social work was also unavailable after hours. Patient was able to ambulate in a crutch and she states that she has a cane at home.  However she did become very dizzy and nauseated when she tried to ambulate.  She is on blood thinners as well.  I spoke to the admitting team and they stated that they could bring the patient in for observation but that they would have no criteria met for inpatient treatment.  I spoke to the patient about this and she states that she is comfortable going home because she does have family members that can take care of her.  I did speak to the admitting doctor again in an effort to advocate for the patient's safety at home, and make her less at risk for falls, since she is also on anticoagulation. However, still no inpatient criteria met, and patient, understandably, does not want to pay out of pocket for hospital observation. We will arrange for PT to meet with her as soon as possible for evaluation. Will give Norco for pain to be taken as needed for severe pain, as patient states that  she has taken this before with no problems.  Plan to take Tylenol otherwise as needed for pain.  Instructions given on rice therapy.  Patient appears stable for discharge  at this time.  Strict return precautions given.  Patient discussed with and seen by Dr. Kathrynn Humble.  Final Clinical Impressions(s) / ED Diagnoses   Final diagnoses:  Closed displaced fracture of surgical neck of left humerus, unspecified fracture morphology, initial encounter  Closed fracture of distal lateral malleolus of right fibula, initial encounter    ED Discharge Orders        Ordered    HYDROcodone-acetaminophen (NORCO/VICODIN) 5-325 MG tablet  Every 6 hours PRN     08/12/17 2324         Delia Heady, PA-C 08/13/17 0056    Varney Biles, MD 08/13/17 2334

## 2017-08-12 NOTE — Discharge Instructions (Addendum)
Please read the attached information regarding your condition.   Follow-up with orthopedist listed below for further evaluation. Take Norco as needed for severe pain. Take Tylenol as needed for pain. Follow instructions regarding RICE therapy for your right ankle. Return to ED for worsening pain, additional injuries or falls, numbness in legs, chest pain or shortness of breath.

## 2017-08-13 DIAGNOSIS — S92909A Unspecified fracture of unspecified foot, initial encounter for closed fracture: Secondary | ICD-10-CM | POA: Diagnosis not present

## 2017-08-13 DIAGNOSIS — S42009A Fracture of unspecified part of unspecified clavicle, initial encounter for closed fracture: Secondary | ICD-10-CM | POA: Diagnosis not present

## 2017-08-13 DIAGNOSIS — G8911 Acute pain due to trauma: Secondary | ICD-10-CM | POA: Diagnosis not present

## 2017-08-13 NOTE — ED Notes (Signed)
[facility] here to transport patient home. Daughter is at patient's home and will help care for her as needed.

## 2017-08-14 LAB — URINE CULTURE: Culture: 50000 — AB

## 2017-08-15 ENCOUNTER — Telehealth: Payer: Self-pay | Admitting: Emergency Medicine

## 2017-08-15 ENCOUNTER — Encounter (INDEPENDENT_AMBULATORY_CARE_PROVIDER_SITE_OTHER): Payer: Self-pay | Admitting: Orthopaedic Surgery

## 2017-08-15 ENCOUNTER — Other Ambulatory Visit (INDEPENDENT_AMBULATORY_CARE_PROVIDER_SITE_OTHER): Payer: Self-pay

## 2017-08-15 ENCOUNTER — Ambulatory Visit (INDEPENDENT_AMBULATORY_CARE_PROVIDER_SITE_OTHER): Payer: PPO | Admitting: Orthopaedic Surgery

## 2017-08-15 ENCOUNTER — Ambulatory Visit
Admission: RE | Admit: 2017-08-15 | Discharge: 2017-08-15 | Disposition: A | Discharge status: Home / Self Care | Payer: PPO | Source: Ambulatory Visit | Attending: Orthopedic Surgery | Admitting: Orthopedic Surgery

## 2017-08-15 DIAGNOSIS — S42212A Unspecified displaced fracture of surgical neck of left humerus, initial encounter for closed fracture: Secondary | ICD-10-CM | POA: Diagnosis not present

## 2017-08-15 DIAGNOSIS — S42292A Other displaced fracture of upper end of left humerus, initial encounter for closed fracture: Secondary | ICD-10-CM

## 2017-08-15 DIAGNOSIS — S8264XA Nondisplaced fracture of lateral malleolus of right fibula, initial encounter for closed fracture: Secondary | ICD-10-CM

## 2017-08-15 MED ORDER — HYDROCODONE-ACETAMINOPHEN 5-325 MG PO TABS: 1.0000 | ORAL_TABLET | Freq: Four times a day (QID) | ORAL | 0 refills | Status: DC | PRN

## 2017-08-15 NOTE — Progress Notes (Signed)
Office Visit Note   Patient: Alyssa Martin           Date of Birth: 06/23/1946           MRN: [id number] Visit Date: 08/15/2017              Requested by: Marton Redwood, MD [address] [place], [place] [address] PCP: Marton Redwood, MD   Assessment & Plan: Visit Diagnoses:  1. Closed 3-part fracture of proximal humerus, left, initial encounter   2. Closed nondisplaced fracture of lateral malleolus of right fibula, initial encounter     Plan: As far as her right ankle lateral malleolus fracture, she understands this is a stable fracture pattern she will continue to weight-bear as tolerated in the cam walking boot.  She is doing well as far as that goes.  From the aspect of her left shoulder though I had Dr. Marlou Sa evaluate her due to the displaced nature of this left proximal humerus fracture.  He agrees that the needs surgical intervention with likely with open reduction internal fixation with a plate and screws with a backup plan being a shoulder replacement.  We will obtain a CT scan urgently to assess the bone quality and the fracture fragments and alignment.  We will also have the surgery scheduled see her for setting her up for surgery for later this week versus next week with Dr. Marlou Sa.  Her postoperative follow-up will also be with Dr. Marlou Sa.  All questions were encouraged and answered.  She will hold off on taking any more of her blood thinning medication Xarelto.  Can be started back the day of surgery or after.  Follow-Up Instructions: Return for 2 weeks post-op.   Orders:  No orders of the defined types were placed in this encounter.  Meds ordered this encounter  Medications  . HYDROcodone-acetaminophen (NORCO/VICODIN) 5-325 MG tablet    Sig: Take 1-2 tablets by mouth every 6 (six) hours as needed for moderate pain.    Dispense:  30 tablet    Refill:  0      Procedures: No procedures performed   Clinical Data: No additional findings.   Subjective: Chief Complaint    Patient presents with  . Left Shoulder - Pain, Injury  . Right Ankle - Injury, Pain   The patient is a very pleasant 71 year old F seen in the past.  She sustained injuries to her left proximal humerus and her right ankle after a fall in the park which was accidental trip and fall 3 days ago on Friday.  She sustained a 3 part displaced proximal humerus fracture left side and a right ankle lateral mellitus fracture which is stable fracture.  She is placed in a Cam walker.  She is following for office today.  She said her shoulder is hurting significantly.  She does need a refill on her pain medications.  She denies any numbness tingling in her hands or feet.  She has been getting around relatively well prior to this injury.  She is on Xarelto as a blood thinner for chronic atrial fibrillation. HPI  Review of Systems She currently denies any headache, chest pain, shortness of breath, fever, chills, nausea, vomiting.  Objective: Vital Signs: There were no vitals taken for this visit.  Physical Exam She is alert and oriented x3 and in no acute distress Ortho Exam Examination of her left shoulder shows that clinically is well located.  She has significant bruising and swelling around her shoulder.  Distally motor  and sensory exam is normal in her hand.  Evaluation of her right ankle just shows global swelling mainly laterally but the ankle is located in her foot is neurovascular intact. Specialty Comments:  No specialty comments available.  Imaging: No results found. X-rays on the canopy system independently reviewed of her left shoulder and right ankle show a stable right ankle lateral malleolus fracture with a well located ankle mortise.  This was a distal fracture.  She has unstable 3 part to 4 part proximal humerus fracture of the left proximal humerus.  PMFS History: Patient Active Problem List   Diagnosis Date Noted  . Paroxysmal A-fib ([place]) 09/08/2012   Past Medical History:   Diagnosis Date  . Depression with anxiety   . Elevated BP   . GERD (gastroesophageal reflux disease)   . Hyperlipidemia   . Hypertension   . Impaired fasting glucose   . Osteopenia   . Osteoporosis   . Paroxysmal atrial fibrillation (HCC)   . Vitamin D deficiency     Family History  Problem Relation Age of Onset  . Hypertension Mother   . Osteoarthritis Mother   . Bone cancer Father   . Osteopenia Father   . Lung cancer Brother   . Lung cancer Paternal Grandmother   . Leukemia Paternal Grandfather   . Bone cancer Brother     Past Surgical History:  Procedure Laterality Date  . COLONOSCOPY  05/10/2011  . DIAGNOSTIC MAMMOGRAM  10 /09/2010  . TIBIA FRACTURE SURGERY  04/2006   Left--From falling down the stairs  . TIBIA FRACTURE SURGERY     In her 80's   Social History   Occupational History  . Occupation: teacher-retired  Tobacco Use  . Smoking status: Never Smoker  . Smokeless tobacco: Never Used  Substance and Sexual Activity  . Alcohol use: No    Alcohol/week: 0.0 oz  . Drug use: No  . Sexual activity: Not on file

## 2017-08-15 NOTE — Telephone Encounter (Signed)
Post ED Visit - Positive Culture Follow-up  Culture report reviewed by antimicrobial stewardship pharmacist:  []  Elenor Quinones, Pharm.D. []  Heide Guile, Pharm.D., BCPS AQ-ID []  Parks Neptune, Pharm.D., BCPS []  Alycia Rossetti, Pharm.D., BCPS []  [place], Pharm.D., BCPS, AAHIVP []  Legrand Como, Pharm.D., BCPS, AAHIVP []  Salome Arnt, PharmD, BCPS []  Dimitri Ped, PharmD, BCPS []  Vincenza Hews, PharmD, BCPS Ulanda Edison PharmD  Positive urine culture Treated with nitrofurantoin, organism sensitive to the same and no further patient follow-up is required at this time.  [name] 08/15/2017, 10:39 AM

## 2017-08-16 ENCOUNTER — Telehealth (INDEPENDENT_AMBULATORY_CARE_PROVIDER_SITE_OTHER): Payer: Self-pay | Admitting: Orthopedic Surgery

## 2017-08-16 NOTE — Telephone Encounter (Signed)
Patient's daughter Wellington Hampshire) called left voicemail message for a return call to go over patient's recovery time with her. The number to contact Sharyn Lull is [phone]

## 2017-08-16 NOTE — Telephone Encounter (Signed)
Patient called and would like a call back concerning the above issue. Please call patient back to advise  # [phone] (home)    [phone] (cell)

## 2017-08-16 NOTE — Telephone Encounter (Signed)
Can you advise on this

## 2017-08-17 ENCOUNTER — Encounter (HOSPITAL_COMMUNITY): Payer: Self-pay | Admitting: *Deleted

## 2017-08-17 ENCOUNTER — Other Ambulatory Visit (INDEPENDENT_AMBULATORY_CARE_PROVIDER_SITE_OTHER): Payer: Self-pay | Admitting: Orthopedic Surgery

## 2017-08-17 ENCOUNTER — Telehealth (INDEPENDENT_AMBULATORY_CARE_PROVIDER_SITE_OTHER): Payer: Self-pay

## 2017-08-17 ENCOUNTER — Other Ambulatory Visit: Payer: Self-pay

## 2017-08-17 DIAGNOSIS — S42202A Unspecified fracture of upper end of left humerus, initial encounter for closed fracture: Secondary | ICD-10-CM

## 2017-08-17 NOTE — Telephone Encounter (Signed)
Per call from Levada Dy, NP in short stay,  according to pharmacy records patient has been diagnosed recently with UTI in which an antibiotic was prescribed.  Patient has not begun to take antibiotic as of my phone call to her today.

## 2017-08-17 NOTE — Telephone Encounter (Signed)
I called.

## 2017-08-17 NOTE — Telephone Encounter (Signed)
She should start taking the antibiotic today and we will also give her antibiotics before surgery tomorrow and that should be fine please call her today thanks

## 2017-08-17 NOTE — Progress Notes (Signed)
Pt denies SOB and  chest pain. Pt under the care of Dr. Peter Martinique, Cardiology. Pt denies having a stress test and cardiac cath. Pt denies having a chest x ray within the last year. Pt stated that her last dose of Xarelto was Sunday as advised by surgeon. Pt stated that she does not take Aspirin. Pt made aware to stop taking vitamins, fish oil and herbal medications. Do not take any NSAIDs ie: Ibuprofen, Advil, Naproxen (Aleve), Motrin, BC and Goody Powder or any medication containing Aspirin. Pt verbalized understanding of all pre-op instructions.

## 2017-08-17 NOTE — Telephone Encounter (Signed)
Please see note from Dr Marlou Sa. I have called patient and advise. She verbalized understanding.

## 2017-08-17 NOTE — Progress Notes (Signed)
Anesthesia Chart Review:  Pt is a same day work up.   Pt is a 71 year old female scheduled for ORIF L proximal humerus fracture vs reverse shoulder replacement on 08/18/2017 with Meredith Pel, MD  - PCP is Marton Redwood, MD - Cardiologist is Peter Martinique, MD. Last office visit 12/10/16, 1 yr f/u recommended.   PMH includes:  PAF, HTN, hyperlipidemia, impaired fasting glucose, GERD. Never smoker. BMI 23.  - ED visit 08/12/17 for fall, humerus fracture, ankle fracture.   Medications include: Diltiazem, Macrodantin (rx for UTI 08/09/17, pt reportedly has not started this yet), Prilosec, Zantac, xarelto, simvastatin. Pt stopped xarelto 08/15/17.   Labs from ED visit 08/12/17 reviewed. CBC and BMET acceptable for surgery. Urine culture shows UTI.   EKG 12/10/16: NSR  Echo 09/14/12:  - Left ventricle: The cavity size was normal. Wall thicknesswas normal. Systolic function was normal. The estimatedejection fraction was in the range of 55% to 65%. Wallmotion was normal; there were no regional wall motionabnormalities. Doppler parameters are consistent withabnormal left ventricular relaxation (grade 1 diastolic dysfunction). - Aortic valve: Trivial regurgitation. - Mitral valve: Mild regurgitation. - Left atrium: The atrium was mildly dilated. - Atrial septum: No defect or patent foramen ovale wasidentified. - Pericardium, extracardiac: A trivial pericardial effusionwas identified.   I notified Judeen Hammans in Dr. Randel Pigg office it appears pt has not started antibiotics for her UTI dx a week ago.   If no changes, I anticipate pt can proceed with surgery as scheduled.   Willeen Cass, FNP-BC [facility]/Anesthesiology Phone: [phone] 08/17/2017 1:43 PM

## 2017-08-17 NOTE — Telephone Encounter (Signed)
Please advise. Thanks.  

## 2017-08-18 ENCOUNTER — Inpatient Hospital Stay (HOSPITAL_COMMUNITY)
Admission: RE | Admit: 2017-08-18 | Discharge: 2017-08-20 | DRG: 494 | Disposition: A | Discharge status: Home / Self Care | Payer: PPO | Source: Ambulatory Visit | Attending: Orthopedic Surgery | Admitting: Orthopedic Surgery

## 2017-08-18 ENCOUNTER — Encounter (HOSPITAL_COMMUNITY)
Admission: RE | Disposition: A | Discharge status: Home / Self Care | Payer: Self-pay | Source: Ambulatory Visit | Attending: Orthopedic Surgery

## 2017-08-18 ENCOUNTER — Encounter (HOSPITAL_COMMUNITY): Payer: Self-pay | Admitting: *Deleted

## 2017-08-18 ENCOUNTER — Inpatient Hospital Stay (HOSPITAL_COMMUNITY): Payer: PPO | Admitting: Emergency Medicine

## 2017-08-18 ENCOUNTER — Inpatient Hospital Stay (HOSPITAL_COMMUNITY): Payer: PPO

## 2017-08-18 DIAGNOSIS — Z7901 Long term (current) use of anticoagulants: Secondary | ICD-10-CM | POA: Diagnosis not present

## 2017-08-18 DIAGNOSIS — Z79899 Other long term (current) drug therapy: Secondary | ICD-10-CM | POA: Diagnosis not present

## 2017-08-18 DIAGNOSIS — S42212A Unspecified displaced fracture of surgical neck of left humerus, initial encounter for closed fracture: Secondary | ICD-10-CM | POA: Diagnosis not present

## 2017-08-18 DIAGNOSIS — I10 Essential (primary) hypertension: Secondary | ICD-10-CM | POA: Diagnosis not present

## 2017-08-18 DIAGNOSIS — G8918 Other acute postprocedural pain: Secondary | ICD-10-CM | POA: Diagnosis not present

## 2017-08-18 DIAGNOSIS — W19XXXA Unspecified fall, initial encounter: Secondary | ICD-10-CM | POA: Diagnosis present

## 2017-08-18 DIAGNOSIS — Z888 Allergy status to other drugs, medicaments and biological substances status: Secondary | ICD-10-CM

## 2017-08-18 DIAGNOSIS — E785 Hyperlipidemia, unspecified: Secondary | ICD-10-CM | POA: Diagnosis present

## 2017-08-18 DIAGNOSIS — Z880 Allergy status to penicillin: Secondary | ICD-10-CM

## 2017-08-18 DIAGNOSIS — I480 Paroxysmal atrial fibrillation: Secondary | ICD-10-CM | POA: Diagnosis not present

## 2017-08-18 DIAGNOSIS — S42292A Other displaced fracture of upper end of left humerus, initial encounter for closed fracture: Secondary | ICD-10-CM | POA: Diagnosis not present

## 2017-08-18 DIAGNOSIS — S42202A Unspecified fracture of upper end of left humerus, initial encounter for closed fracture: Secondary | ICD-10-CM | POA: Diagnosis not present

## 2017-08-18 DIAGNOSIS — S42302A Unspecified fracture of shaft of humerus, left arm, initial encounter for closed fracture: Secondary | ICD-10-CM | POA: Diagnosis not present

## 2017-08-18 DIAGNOSIS — K219 Gastro-esophageal reflux disease without esophagitis: Secondary | ICD-10-CM | POA: Diagnosis present

## 2017-08-18 DIAGNOSIS — S8261XA Displaced fracture of lateral malleolus of right fibula, initial encounter for closed fracture: Secondary | ICD-10-CM | POA: Diagnosis not present

## 2017-08-18 DIAGNOSIS — M810 Age-related osteoporosis without current pathological fracture: Secondary | ICD-10-CM | POA: Diagnosis present

## 2017-08-18 DIAGNOSIS — S4292XA Fracture of left shoulder girdle, part unspecified, initial encounter for closed fracture: Secondary | ICD-10-CM | POA: Diagnosis present

## 2017-08-18 DIAGNOSIS — Z419 Encounter for procedure for purposes other than remedying health state, unspecified: Secondary | ICD-10-CM

## 2017-08-18 HISTORY — DX: Unspecified osteoarthritis, unspecified site: M19.90

## 2017-08-18 HISTORY — PX: ORIF HUMERUS FRACTURE: SHX2126

## 2017-08-18 HISTORY — DX: Presence of spectacles and contact lenses: Z97.3

## 2017-08-18 HISTORY — DX: Unspecified fracture of upper end of left humerus, initial encounter for closed fracture: S42.202A

## 2017-08-18 HISTORY — DX: Other fracture of right lower leg, initial encounter for closed fracture: S82.891A

## 2017-08-18 LAB — PROTIME-INR
INR: 0.99
PROTHROMBIN TIME: 13 s (ref 11.4–15.2)

## 2017-08-18 SURGERY — OPEN REDUCTION INTERNAL FIXATION (ORIF) PROXIMAL HUMERUS FRACTURE
Anesthesia: General | Site: Shoulder | Laterality: Left

## 2017-08-18 MED ORDER — EPHEDRINE SULFATE-NACL 50-0.9 MG/10ML-% IV SOSY
PREFILLED_SYRINGE | INTRAVENOUS | Status: DC | PRN
  Administered 2017-08-18: 10 mg via INTRAVENOUS

## 2017-08-18 MED ORDER — ROCURONIUM BROMIDE 10 MG/ML (PF) SYRINGE
PREFILLED_SYRINGE | INTRAVENOUS | Status: AC
  Filled 2017-08-18: qty 5

## 2017-08-18 MED ORDER — MORPHINE SULFATE (PF) 2 MG/ML IV SOLN: 2.0000 mg | INTRAVENOUS | Status: DC | PRN

## 2017-08-18 MED ORDER — METHOCARBAMOL 1000 MG/10ML IJ SOLN
500.0000 mg | Freq: Four times a day (QID) | INTRAMUSCULAR | Status: DC | PRN
  Filled 2017-08-18: qty 5

## 2017-08-18 MED ORDER — FENTANYL CITRATE (PF) 100 MCG/2ML IJ SOLN
INTRAMUSCULAR | Status: AC
  Administered 2017-08-18: 50 ug via INTRAVENOUS
  Filled 2017-08-18: qty 2

## 2017-08-18 MED ORDER — POTASSIUM CHLORIDE IN NACL 20-0.9 MEQ/L-% IV SOLN
INTRAVENOUS | Status: AC
  Administered 2017-08-18 – 2017-08-19 (×2): via INTRAVENOUS
  Filled 2017-08-18: qty 1000

## 2017-08-18 MED ORDER — CHLORHEXIDINE GLUCONATE 4 % EX LIQD: 60.0000 mL | Freq: Once | CUTANEOUS | Status: DC

## 2017-08-18 MED ORDER — CLINDAMYCIN PHOSPHATE 900 MG/50ML IV SOLN
INTRAVENOUS | Status: AC
Start: 2017-08-18 — End: 2017-08-18
  Filled 2017-08-18: qty 50

## 2017-08-18 MED ORDER — BUPIVACAINE-EPINEPHRINE (PF) 0.5% -1:200000 IJ SOLN
INTRAMUSCULAR | Status: DC | PRN
  Administered 2017-08-18: 30 mL via PERINEURAL

## 2017-08-18 MED ORDER — CLINDAMYCIN PHOSPHATE 900 MG/50ML IV SOLN
900.0000 mg | INTRAVENOUS | Status: AC
  Administered 2017-08-18: 900 mg via INTRAVENOUS

## 2017-08-18 MED ORDER — PROPOFOL 10 MG/ML IV BOLUS
INTRAVENOUS | Status: DC | PRN
  Administered 2017-08-18: 120 mg via INTRAVENOUS

## 2017-08-18 MED ORDER — FENTANYL CITRATE (PF) 250 MCG/5ML IJ SOLN
INTRAMUSCULAR | Status: AC
  Filled 2017-08-18: qty 5

## 2017-08-18 MED ORDER — OXYCODONE HCL 5 MG/5ML PO SOLN: 5.0000 mg | Freq: Once | ORAL | Status: AC | PRN

## 2017-08-18 MED ORDER — RIVAROXABAN 10 MG PO TABS
10.0000 mg | ORAL_TABLET | Freq: Every day | ORAL | Status: DC
  Administered 2017-08-19: 10 mg via ORAL
  Filled 2017-08-18: qty 1

## 2017-08-18 MED ORDER — METOCLOPRAMIDE HCL 5 MG/ML IJ SOLN: 5.0000 mg | Freq: Three times a day (TID) | INTRAMUSCULAR | Status: DC | PRN

## 2017-08-18 MED ORDER — MENTHOL 3 MG MT LOZG: 1.0000 | LOZENGE | OROMUCOSAL | Status: DC | PRN

## 2017-08-18 MED ORDER — PROPOFOL 10 MG/ML IV BOLUS
INTRAVENOUS | Status: AC
  Filled 2017-08-18: qty 20

## 2017-08-18 MED ORDER — ONDANSETRON HCL 4 MG/2ML IJ SOLN: 4.0000 mg | Freq: Four times a day (QID) | INTRAMUSCULAR | Status: DC | PRN

## 2017-08-18 MED ORDER — METHOCARBAMOL 500 MG PO TABS
ORAL_TABLET | ORAL | Status: AC
  Filled 2017-08-18: qty 1

## 2017-08-18 MED ORDER — ROCURONIUM BROMIDE 100 MG/10ML IV SOLN
INTRAVENOUS | Status: DC | PRN
  Administered 2017-08-18: 50 mg via INTRAVENOUS

## 2017-08-18 MED ORDER — OXYCODONE HCL 5 MG PO TABS
ORAL_TABLET | ORAL | Status: AC
  Filled 2017-08-18: qty 1

## 2017-08-18 MED ORDER — SUCCINYLCHOLINE CHLORIDE 200 MG/10ML IV SOSY
PREFILLED_SYRINGE | INTRAVENOUS | Status: AC
  Filled 2017-08-18: qty 10

## 2017-08-18 MED ORDER — MIDAZOLAM HCL 2 MG/2ML IJ SOLN
1.0000 mg | Freq: Once | INTRAMUSCULAR | Status: AC
  Administered 2017-08-18: 1 mg via INTRAVENOUS

## 2017-08-18 MED ORDER — PHENYLEPHRINE 40 MCG/ML (10ML) SYRINGE FOR IV PUSH (FOR BLOOD PRESSURE SUPPORT)
PREFILLED_SYRINGE | INTRAVENOUS | Status: DC | PRN
  Administered 2017-08-18 (×3): 80 ug via INTRAVENOUS

## 2017-08-18 MED ORDER — LACTATED RINGERS IV SOLN
INTRAVENOUS | Status: DC | PRN
  Administered 2017-08-18: 14:00:00 via INTRAVENOUS

## 2017-08-18 MED ORDER — ONDANSETRON HCL 4 MG PO TABS: 4.0000 mg | ORAL_TABLET | Freq: Four times a day (QID) | ORAL | Status: DC | PRN

## 2017-08-18 MED ORDER — PHENYLEPHRINE 40 MCG/ML (10ML) SYRINGE FOR IV PUSH (FOR BLOOD PRESSURE SUPPORT)
PREFILLED_SYRINGE | INTRAVENOUS | Status: AC
  Filled 2017-08-18: qty 10

## 2017-08-18 MED ORDER — LIDOCAINE 2% (20 MG/ML) 5 ML SYRINGE
INTRAMUSCULAR | Status: AC
  Filled 2017-08-18: qty 5

## 2017-08-18 MED ORDER — 0.9 % SODIUM CHLORIDE (POUR BTL) OPTIME
TOPICAL | Status: DC | PRN
  Administered 2017-08-18 (×3): 1000 mL

## 2017-08-18 MED ORDER — OXYCODONE HCL 5 MG PO TABS
5.0000 mg | ORAL_TABLET | Freq: Once | ORAL | Status: AC | PRN
  Administered 2017-08-18: 5 mg via ORAL

## 2017-08-18 MED ORDER — FENTANYL CITRATE (PF) 100 MCG/2ML IJ SOLN
50.0000 ug | Freq: Once | INTRAMUSCULAR | Status: AC
  Administered 2017-08-18: 50 ug via INTRAVENOUS

## 2017-08-18 MED ORDER — MIDAZOLAM HCL 2 MG/2ML IJ SOLN
INTRAMUSCULAR | Status: AC
  Administered 2017-08-18: 1 mg via INTRAVENOUS
  Filled 2017-08-18: qty 2

## 2017-08-18 MED ORDER — PANTOPRAZOLE SODIUM 40 MG PO TBEC
40.0000 mg | DELAYED_RELEASE_TABLET | Freq: Every day | ORAL | Status: DC
  Administered 2017-08-19 – 2017-08-20 (×2): 40 mg via ORAL
  Filled 2017-08-18 (×2): qty 1

## 2017-08-18 MED ORDER — ADULT MULTIVITAMIN W/MINERALS CH
1.0000 | ORAL_TABLET | Freq: Every day | ORAL | Status: DC
  Administered 2017-08-19 – 2017-08-20 (×2): 1 via ORAL
  Filled 2017-08-18 (×2): qty 1

## 2017-08-18 MED ORDER — FENTANYL CITRATE (PF) 100 MCG/2ML IJ SOLN
25.0000 ug | INTRAMUSCULAR | Status: DC | PRN
  Administered 2017-08-18: 25 ug via INTRAVENOUS

## 2017-08-18 MED ORDER — ARTIFICIAL TEARS OPHTHALMIC OINT
TOPICAL_OINTMENT | OPHTHALMIC | Status: DC | PRN
  Administered 2017-08-18: 1 via OPHTHALMIC

## 2017-08-18 MED ORDER — VANCOMYCIN HCL IN DEXTROSE 1-5 GM/200ML-% IV SOLN
1000.0000 mg | Freq: Two times a day (BID) | INTRAVENOUS | Status: AC
  Administered 2017-08-18: 1000 mg via INTRAVENOUS
  Filled 2017-08-18 (×2): qty 200

## 2017-08-18 MED ORDER — DILTIAZEM HCL ER COATED BEADS 120 MG PO CP24
120.0000 mg | ORAL_CAPSULE | Freq: Every day | ORAL | Status: DC
  Administered 2017-08-18 – 2017-08-20 (×2): 120 mg via ORAL
  Filled 2017-08-18 (×5): qty 1

## 2017-08-18 MED ORDER — METHOCARBAMOL 500 MG PO TABS
500.0000 mg | ORAL_TABLET | Freq: Four times a day (QID) | ORAL | Status: DC | PRN
  Administered 2017-08-18 – 2017-08-20 (×4): 500 mg via ORAL
  Filled 2017-08-18 (×3): qty 1

## 2017-08-18 MED ORDER — PHENYLEPHRINE HCL 10 MG/ML IJ SOLN
INTRAVENOUS | Status: DC | PRN
  Administered 2017-08-18: 60 ug/min via INTRAVENOUS

## 2017-08-18 MED ORDER — FENTANYL CITRATE (PF) 100 MCG/2ML IJ SOLN
INTRAMUSCULAR | Status: DC | PRN
  Administered 2017-08-18: 50 ug via INTRAVENOUS
  Administered 2017-08-18: 100 ug via INTRAVENOUS

## 2017-08-18 MED ORDER — BUPIVACAINE HCL (PF) 0.25 % IJ SOLN
INTRAMUSCULAR | Status: AC
  Filled 2017-08-18: qty 30

## 2017-08-18 MED ORDER — ACETAMINOPHEN 650 MG RE SUPP: 650.0000 mg | RECTAL | Status: DC | PRN

## 2017-08-18 MED ORDER — EPHEDRINE 5 MG/ML INJ
INTRAVENOUS | Status: AC
  Filled 2017-08-18: qty 10

## 2017-08-18 MED ORDER — PHENOL 1.4 % MT LIQD: 1.0000 | OROMUCOSAL | Status: DC | PRN

## 2017-08-18 MED ORDER — METOCLOPRAMIDE HCL 5 MG PO TABS: 5.0000 mg | ORAL_TABLET | Freq: Three times a day (TID) | ORAL | Status: DC | PRN

## 2017-08-18 MED ORDER — FENTANYL CITRATE (PF) 100 MCG/2ML IJ SOLN
INTRAMUSCULAR | Status: AC
  Filled 2017-08-18: qty 2

## 2017-08-18 MED ORDER — SUGAMMADEX SODIUM 200 MG/2ML IV SOLN
INTRAVENOUS | Status: AC
  Filled 2017-08-18: qty 2

## 2017-08-18 MED ORDER — OXYCODONE HCL 5 MG PO TABS
10.0000 mg | ORAL_TABLET | ORAL | Status: DC | PRN
  Administered 2017-08-19 – 2017-08-20 (×5): 10 mg via ORAL
  Filled 2017-08-18 (×5): qty 2

## 2017-08-18 MED ORDER — MORPHINE SULFATE (PF) 4 MG/ML IV SOLN
2.0000 mg | INTRAVENOUS | Status: DC | PRN
  Administered 2017-08-19: 2 mg via INTRAVENOUS
  Filled 2017-08-18: qty 1

## 2017-08-18 MED ORDER — ONDANSETRON HCL 4 MG/2ML IJ SOLN
INTRAMUSCULAR | Status: DC | PRN
  Administered 2017-08-18: 4 mg via INTRAVENOUS

## 2017-08-18 MED ORDER — FAMOTIDINE 20 MG PO TABS
10.0000 mg | ORAL_TABLET | Freq: Every day | ORAL | Status: DC
  Administered 2017-08-18 – 2017-08-19 (×2): 10 mg via ORAL
  Filled 2017-08-18 (×3): qty 1

## 2017-08-18 MED ORDER — LIDOCAINE 2% (20 MG/ML) 5 ML SYRINGE
INTRAMUSCULAR | Status: DC | PRN
  Administered 2017-08-18: 100 mg via INTRAVENOUS

## 2017-08-18 MED ORDER — ONDANSETRON HCL 4 MG/2ML IJ SOLN
INTRAMUSCULAR | Status: AC
  Filled 2017-08-18: qty 2

## 2017-08-18 MED ORDER — SIMVASTATIN 20 MG PO TABS
20.0000 mg | ORAL_TABLET | Freq: Every evening | ORAL | Status: DC
  Administered 2017-08-18 – 2017-08-19 (×2): 20 mg via ORAL
  Filled 2017-08-18 (×2): qty 1

## 2017-08-18 MED ORDER — DEXAMETHASONE SODIUM PHOSPHATE 10 MG/ML IJ SOLN
INTRAMUSCULAR | Status: AC
  Filled 2017-08-18: qty 1

## 2017-08-18 MED ORDER — ACETAMINOPHEN 325 MG PO TABS: 650.0000 mg | ORAL_TABLET | ORAL | Status: DC | PRN

## 2017-08-18 MED ORDER — DEXAMETHASONE SODIUM PHOSPHATE 4 MG/ML IJ SOLN
INTRAMUSCULAR | Status: DC | PRN
  Administered 2017-08-18: 8 mg via INTRAVENOUS

## 2017-08-18 MED ORDER — LACTATED RINGERS IV SOLN
INTRAVENOUS | Status: DC
  Administered 2017-08-18: 14:00:00 via INTRAVENOUS

## 2017-08-18 MED ORDER — SUGAMMADEX SODIUM 200 MG/2ML IV SOLN
INTRAVENOUS | Status: DC | PRN
  Administered 2017-08-18: 200 mg via INTRAVENOUS

## 2017-08-18 SURGICAL SUPPLY — 99 items
ALCOHOL 70% 16 OZ (MISCELLANEOUS) ×3 IMPLANT
APL SKNCLS STERI-STRIP NONHPOA (GAUZE/BANDAGES/DRESSINGS) ×1
BANDAGE ACE 4X5 VEL STRL LF (GAUZE/BANDAGES/DRESSINGS) IMPLANT
BANDAGE ACE 6X5 VEL STRL LF (GAUZE/BANDAGES/DRESSINGS) IMPLANT
BENZOIN TINCTURE PRP APPL 2/3 (GAUZE/BANDAGES/DRESSINGS) ×3 IMPLANT
BIT DRILL 3.2 (BIT) ×3
BIT DRILL 3.2XCALB NS DISP (BIT) IMPLANT
BIT DRILL CALIBRATED 2.7 (BIT) ×1 IMPLANT
BIT DRILL CALIBRATED 2.7MM (BIT) ×1
BIT DRL 3.2XCALB NS DISP (BIT) ×1
BLADE SAW SGTL 13X75X1.27 (BLADE) ×1 IMPLANT
BNDG COHESIVE 4X5 TAN STRL (GAUZE/BANDAGES/DRESSINGS) ×1 IMPLANT
CLOSURE WOUND 1/2 X4 (GAUZE/BANDAGES/DRESSINGS) ×1
COVER SURGICAL LIGHT HANDLE (MISCELLANEOUS) ×3 IMPLANT
DRAIN PENROSE 1/2X12 LTX STRL (WOUND CARE) IMPLANT
DRAPE C-ARM 42X72 X-RAY (DRAPES) ×2 IMPLANT
DRAPE IMP U-DRAPE 54X76 (DRAPES) ×3 IMPLANT
DRAPE INCISE IOBAN 66X45 STRL (DRAPES) ×4 IMPLANT
DRAPE U-SHAPE 47X51 STRL (DRAPES) ×6 IMPLANT
DRSG AQUACEL AG ADV 3.5X10 (GAUZE/BANDAGES/DRESSINGS) ×3 IMPLANT
DRSG PAD ABDOMINAL 8X10 ST (GAUZE/BANDAGES/DRESSINGS) IMPLANT
DURAPREP 26ML APPLICATOR (WOUND CARE) ×3 IMPLANT
ELECT BLADE 4.0 EZ CLEAN MEGAD (MISCELLANEOUS) ×3
ELECT REM PT RETURN 9FT ADLT (ELECTROSURGICAL) ×3
ELECTRODE BLDE 4.0 EZ CLN MEGD (MISCELLANEOUS) IMPLANT
ELECTRODE REM PT RTRN 9FT ADLT (ELECTROSURGICAL) ×1 IMPLANT
FACESHIELD WRAPAROUND (MASK) ×3 IMPLANT
FACESHIELD WRAPAROUND OR TEAM (MASK) ×1 IMPLANT
GAUZE SPONGE 4X4 12PLY STRL (GAUZE/BANDAGES/DRESSINGS) IMPLANT
GAUZE XEROFORM 5X9 LF (GAUZE/BANDAGES/DRESSINGS) IMPLANT
GLOVE BIOGEL PI IND STRL 7.5 (GLOVE) ×1 IMPLANT
GLOVE BIOGEL PI IND STRL 8 (GLOVE) ×1 IMPLANT
GLOVE BIOGEL PI INDICATOR 7.5 (GLOVE)
GLOVE BIOGEL PI INDICATOR 8 (GLOVE) ×2
GLOVE ECLIPSE 7.0 STRL STRAW (GLOVE) ×1 IMPLANT
GLOVE SURG ORTHO 8.0 STRL STRW (GLOVE) ×3 IMPLANT
GOWN STRL REUS W/ TWL LRG LVL3 (GOWN DISPOSABLE) ×2 IMPLANT
GOWN STRL REUS W/ TWL XL LVL3 (GOWN DISPOSABLE) ×1 IMPLANT
GOWN STRL REUS W/TWL LRG LVL3 (GOWN DISPOSABLE) ×9
GOWN STRL REUS W/TWL XL LVL3 (GOWN DISPOSABLE) ×3
HYDROGEN PEROXIDE 16OZ (MISCELLANEOUS) ×3 IMPLANT
K-WIRE 2.0 (WIRE) ×8 IMPLANT
KIT BASIN OR (CUSTOM PROCEDURE TRAY) ×3 IMPLANT
KIT HEADGEAR ORTHO CUSHION (MISCELLANEOUS) ×3 IMPLANT
KIT ROOM TURNOVER OR (KITS) ×3 IMPLANT
LOOP VESSEL MAXI BLUE (MISCELLANEOUS) ×2 IMPLANT
MANIFOLD NEPTUNE II (INSTRUMENTS) ×3 IMPLANT
NDL SCORPION MULTI FIRE (NEEDLE) IMPLANT
NDL SUT 6 .5 CRC .975X.05 MAYO (NEEDLE) ×1 IMPLANT
NEEDLE 21X1 OR PACK (NEEDLE) IMPLANT
NEEDLE MAYO TAPER (NEEDLE) ×3
NEEDLE SCORPION MULTI FIRE (NEEDLE) ×3 IMPLANT
NS IRRIG 1000ML POUR BTL (IV SOLUTION) ×3 IMPLANT
PACK SHOULDER (CUSTOM PROCEDURE TRAY) ×3 IMPLANT
PACK UNIVERSAL I (CUSTOM PROCEDURE TRAY) ×1 IMPLANT
PAD ARMBOARD 7.5X6 YLW CONV (MISCELLANEOUS) ×6 IMPLANT
PAD CAST 4YDX4 CTTN HI CHSV (CAST SUPPLIES) IMPLANT
PADDING CAST COTTON 4X4 STRL (CAST SUPPLIES)
PASSER SUT SWANSON 36MM LOOP (INSTRUMENTS) ×1 IMPLANT
PEG LOCKING 3.2MMX26MM (Peg) ×4 IMPLANT
PEG LOCKING 3.2X34 (Screw) ×2 IMPLANT
PEG LOCKING 3.2X36 (Screw) ×4 IMPLANT
PEG LOCKING 3.2X40 (Peg) ×2 IMPLANT
PEG LOCKING 3.2X42 (Screw) ×2 IMPLANT
PENCIL BUTTON HOLSTER BLD 10FT (ELECTRODE) IMPLANT
PLATE PROX HUMERUS 4H LEFT LOW (Plate) ×2 IMPLANT
SCREW LOCK CANC STAR 4X26 (Screw) ×4 IMPLANT
SCREW LOCK CORT STAR 3.5X20 (Screw) ×2 IMPLANT
SCREW LP NL T15 3.5X20 (Screw) ×6 IMPLANT
SCRUB BETADINE 4OZ XXX (MISCELLANEOUS) ×3 IMPLANT
SLING ARM IMMOBILIZER LRG (SOFTGOODS) ×3 IMPLANT
SPONGE LAP 18X18 X RAY DECT (DISPOSABLE) ×3 IMPLANT
SPONGE LAP 4X18 X RAY DECT (DISPOSABLE) ×2 IMPLANT
STAPLER VISISTAT 35W (STAPLE) IMPLANT
STOCKINETTE IMPERVIOUS 9X36 MD (GAUZE/BANDAGES/DRESSINGS) IMPLANT
STRIP CLOSURE SKIN 1/2X4 (GAUZE/BANDAGES/DRESSINGS) ×1 IMPLANT
SUCTION FRAZIER HANDLE 10FR (MISCELLANEOUS) ×2
SUCTION TUBE FRAZIER 10FR DISP (MISCELLANEOUS) ×1 IMPLANT
SUT FIBERWIRE #2 38 T-5 BLUE (SUTURE)
SUT MAXBRAID (SUTURE) ×8 IMPLANT
SUT MNCRL AB 3-0 PS2 18 (SUTURE) ×3 IMPLANT
SUT SILK 2 0 TIES 17X18 (SUTURE) ×3
SUT SILK 2-0 18XBRD TIE BLK (SUTURE) ×1 IMPLANT
SUT VIC AB 0 CT1 27 (SUTURE) ×6
SUT VIC AB 0 CT1 27XBRD ANBCTR (SUTURE) ×2 IMPLANT
SUT VIC AB 1 CT1 27 (SUTURE) ×12
SUT VIC AB 1 CT1 27XBRD ANBCTR (SUTURE) ×1 IMPLANT
SUT VIC AB 2-0 CT1 27 (SUTURE) ×9
SUT VIC AB 2-0 CT1 TAPERPNT 27 (SUTURE) ×3 IMPLANT
SUT VIC AB 2-0 CTB1 (SUTURE) ×6 IMPLANT
SUT VICRYL 0 AB UR-6 (SUTURE) ×7 IMPLANT
SUTURE FIBERWR #2 38 T-5 BLUE (SUTURE) ×3 IMPLANT
TOWEL OR 17X24 6PK STRL BLUE (TOWEL DISPOSABLE) ×3 IMPLANT
TOWEL OR 17X26 10 PK STRL BLUE (TOWEL DISPOSABLE) ×3 IMPLANT
TRAY FOLEY BAG SILVER LF 16FR (CATHETERS) IMPLANT
TUBE CONNECTING 12'X1/4 (SUCTIONS)
TUBE CONNECTING 12X1/4 (SUCTIONS) IMPLANT
WATER STERILE IRR 1000ML POUR (IV SOLUTION) ×3 IMPLANT
YANKAUER SUCT BULB TIP NO VENT (SUCTIONS) ×2 IMPLANT

## 2017-08-18 NOTE — Brief Op Note (Signed)
08/18/2017  7:22 PM  PATIENT:  Alyssa Martin  71 y.o. female  PRE-OPERATIVE DIAGNOSIS:  left proximal humerus fractrue  POST-OPERATIVE DIAGNOSIS:  left proximal humerus fractrue  PROCEDURE:  Procedure(s): OPEN REDUCTION INTERNAL FIXATION (ORIF) LEFT PROXIMAL HUMERUS FRACTURE  SURGEON:  Surgeon(s): Marlou Sa Tonna Corner, MD  ASSISTANT: Ky Barban rnfa  ANESTHESIA:   general  EBL: 75 ml    No intake/output data recorded.  BLOOD ADMINISTERED: none  DRAINS: none   LOCAL MEDICATIONS USED:  none  SPECIMEN:  No Specimen  COUNTS:  YES  TOURNIQUET:  * No tourniquets in log *  DICTATION: .Other Dictation: Dictation Number [id number]  PLAN OF CARE: Admit to inpatient   PATIENT DISPOSITION:  PACU - hemodynamically stable

## 2017-08-18 NOTE — Anesthesia Procedure Notes (Signed)
Anesthesia Regional Block: Interscalene brachial plexus block   Pre-Anesthetic Checklist: ,, timeout performed, Correct Patient, Correct Site, Correct Laterality, Correct Procedure, Correct Position, site marked, Risks and benefits discussed,  Surgical consent,  Pre-op evaluation,  At surgeon's request and post-op pain management  Laterality: Left  Prep: chloraprep       Needles:  Injection technique: Single-shot  Needle Type: Echogenic Stimulator Needle     Needle Length: 5cm  Needle Gauge: 22     Additional Needles:   Procedures:, nerve stimulator,,,,,,,   Nerve Stimulator or Paresthesia:  Response: biceps flexion, 0.45 mA,   Additional Responses:   Narrative:  Start time: 08/18/2017 2:58 PM End time: 08/18/2017 3:08 PM Injection made incrementally with aspirations every 5 mL.  Performed by: Personally  Anesthesiologist: Albertha Ghee, MD  Additional Notes: Functioning IV was confirmed and monitors were applied.  A 50mm 22ga Arrow echogenic stimulator needle was used. Sterile prep and drape,hand hygiene and sterile gloves were used.  Negative aspiration and negative test dose prior to incremental administration of local anesthetic. The patient tolerated the procedure well.  Ultrasound guidance: relevent anatomy identified, needle position confirmed, local anesthetic spread visualized around nerve(s), vascular puncture avoided.  Image printed for medical record.

## 2017-08-18 NOTE — H&P (Signed)
Alyssa Martin is an 71 y.o. female.   Chief Complaint: Left shoulder pain HPI: Alyssa Martin is a 71 year old female who fell on her left shoulder several days ago.  She sustained a comminuted displaced proximal humerus fracture.  She reports pain in the left shoulder.  She is otherwise very active.  She does takes Xarelto for atrial fibrillation but has not had any in 3 days.  She reports right ankle pain but she is wearing a fracture boot for that and is able to weight-bear.  Patient has family nearby where she lives.  She denies any other orthopedic complaints other than left shoulder and right ankle  Past Medical History:  Diagnosis Date  . Arthritis   . Closed fracture of left proximal humerus   . Closed right ankle fracture   . Depression with anxiety   . Elevated BP   . GERD (gastroesophageal reflux disease)   . Hyperlipidemia   . Hypertension   . Impaired fasting glucose   . Osteopenia   . Osteoporosis   . Paroxysmal atrial fibrillation (HCC)   . Vitamin D deficiency   . Wears glasses     Past Surgical History:  Procedure Laterality Date  . COLONOSCOPY  05/10/2011  . DIAGNOSTIC MAMMOGRAM  10 /09/2010  . TIBIA FRACTURE SURGERY  04/2006   Left--From falling down the stairs  . TIBIA FRACTURE SURGERY     In her 10's    Family History  Problem Relation Age of Onset  . Hypertension Mother   . Osteoarthritis Mother   . Bone cancer Father   . Osteopenia Father   . Lung cancer Brother   . Lung cancer Paternal Grandmother   . Leukemia Paternal Grandfather   . Bone cancer Brother    Social History:  reports that  has never smoked. she has never used smokeless tobacco. She reports that she does not drink alcohol or use drugs.  Allergies:  Allergies  Allergen Reactions  . Penicillins     UNSPECIFIED REACTION   . Ativan [Lorazepam] Nausea Only    Medications Prior to Admission  Medication Sig Dispense Refill  . Calcium Citrate (CITRACAL PO) Take 630 mg by mouth 2 (two) times  daily.     Marland Kitchen DILT-XR 180 MG 24 hr capsule TAKE 1 CAPSULE BY MOUTH EVERY DAY 90 capsule 0  . ergocalciferol (VITAMIN D2) 50000 UNITS capsule Take 50,000 Units by mouth every 14 (fourteen) days.    Marland Kitchen HYDROcodone-acetaminophen (NORCO/VICODIN) 5-325 MG tablet Take 1-2 tablets by mouth every 6 (six) hours as needed for moderate pain. 30 tablet 0  . loratadine (CLARITIN) 10 MG tablet Take 10 mg by mouth daily as needed for allergies.     . mometasone (NASONEX) 50 MCG/ACT nasal spray Place 2 sprays into the nose daily as needed (allergies).     . Multiple Vitamin (MULTIVITAMIN) tablet Take 1 tablet by mouth daily.    . nitrofurantoin (MACRODANTIN) 100 MG capsule TK ONE C PO  Q 12 H D  0  . omeprazole (PRILOSEC) 20 MG capsule Take 20 mg by mouth daily as needed (heartburn).     Marland Kitchen PREMARIN vaginal cream APP 1 GRAM VAGINALLY 2 TIMES A WK  11  . ranitidine (ZANTAC) 150 MG tablet Take 150 mg by mouth 2 (two) times daily as needed for heartburn.    . rivaroxaban (XARELTO) 20 MG TABS tablet Take 1 tablet (20 mg total) by mouth daily with supper. 90 tablet 1  . simvastatin (ZOCOR) 20  MG tablet Take 20 mg by mouth every evening.    . vitamin C (ASCORBIC ACID) 500 MG tablet Take 500 mg by mouth daily.    . zoledronic acid (RECLAST) 5 MG/100ML SOLN injection Inject 5 mg into the vein once. Yearly      No results found for this or any previous visit (from the past 48 hour(s)). No results found.  Review of Systems  Musculoskeletal: Positive for joint pain.  All other systems reviewed and are negative.   There were no vitals taken for this visit. Physical Exam  Constitutional: She appears well-developed.  HENT:  Head: Normocephalic.  Eyes: Pupils are equal, round, and reactive to light.  Neck: Normal range of motion.  Cardiovascular: Normal rate.  Respiratory: Effort normal.  Neurological: She is alert.  Skin: Skin is warm.  Psychiatric: She has a normal mood and affect.    Examination the left  shoulder demonstrates some swelling but no ecchymosis around that shoulder region.  Motor sensory function to the hand is intact.  Deltoid does fire.  EPL FPL interosseous function is intact on the left.  She is able to weight-bear on the right ankle in a fracture boot. Assessment/Plan Impression is left proximal humerus fracture.  Plan is open reduction internal fixation with plate fixation.  Small remote chance that reverse shoulder replacement may be necessary but based on CT scan and fragment orientation as well as time from injury I think that reduction should be possible with plating.  The main risk of that fracture includes infection as well as avascular necrosis.  This would require further surgery and all that's explained to the patient.  All questions answered regarding the risks and benefits of surgery including not limited to infection nerve vessel damage shoulder stiffness as well as prolonged recovery.  Anticipate at least one to 2 days in the hospital in order to determine what her functional capability will be at home.   planto restart Xarelto on the day of or day after after surgery.  Anderson Malta, MD 08/18/2017, 1:55 PM

## 2017-08-18 NOTE — Anesthesia Procedure Notes (Addendum)
Procedure Name: Intubation Date/Time: 08/18/2017 4:52 PM Performed by: Orlie Dakin, CRNA Pre-anesthesia Checklist: Patient identified, Emergency Drugs available, Suction available, Patient being monitored and Timeout performed Patient Re-evaluated:Patient Re-evaluated prior to induction Oxygen Delivery Method: Circle system utilized Preoxygenation: Pre-oxygenation with 100% oxygen Induction Type: IV induction Ventilation: Mask ventilation without difficulty Laryngoscope Size: Miller and 3 Grade View: Grade I Tube type: Oral Tube size: 7.0 mm Number of attempts: 1 Airway Equipment and Method: Stylet Placement Confirmation: ETT inserted through vocal cords under direct vision,  positive ETCO2 and breath sounds checked- equal and bilateral Secured at: 22 cm Tube secured with: Tape Dental Injury: Teeth and Oropharynx as per pre-operative assessment  Comments: 4x4s bite block used.

## 2017-08-18 NOTE — Transfer of Care (Signed)
Immediate Anesthesia Transfer of Care Note  Patient: Alyssa Martin  Procedure(s) Performed: OPEN REDUCTION INTERNAL FIXATION (ORIF) LEFT PROXIMAL HUMERUS FRACTURE (Left Shoulder)  Patient Location: PACU  Anesthesia Type:GA combined with regional for post-op pain  Level of Consciousness: awake, oriented and patient cooperative  Airway & Oxygen Therapy: Patient Spontanous Breathing  Post-op Assessment: Report given to RN and Post -op Vital signs reviewed and stable  Post vital signs: Reviewed and stable  Last Vitals:  Vitals:   08/18/17 1505 08/18/17 1510  BP: (!) 113/50 (!) 100/41  Pulse: 70 70  Resp: 16 16  Temp:    SpO2: 100% 100%    Last Pain:  Vitals:   08/18/17 1405  TempSrc:   PainSc: 7          Complications: No apparent anesthesia complications

## 2017-08-18 NOTE — Anesthesia Preprocedure Evaluation (Signed)
Anesthesia Evaluation  Patient identified by MRN, date of birth, ID band Patient awake    Reviewed: Allergy & Precautions, H&P , NPO status , Patient's Chart, lab work & pertinent test results  Airway Mallampati: II   Neck ROM: full    Dental   Pulmonary neg pulmonary ROS,    breath sounds clear to auscultation       Cardiovascular hypertension, + dysrhythmias Atrial Fibrillation  Rhythm:regular Rate:Normal     Neuro/Psych PSYCHIATRIC DISORDERS Anxiety    GI/Hepatic GERD  ,  Endo/Other    Renal/GU      Musculoskeletal  (+) Arthritis ,   Abdominal   Peds  Hematology   Anesthesia Other Findings   Reproductive/Obstetrics                             Anesthesia Physical Anesthesia Plan  ASA: III  Anesthesia Plan: General   Post-op Pain Management:  Regional for Post-op pain   Induction: Intravenous  PONV Risk Score and Plan: 3 and Ondansetron, Dexamethasone and Treatment may vary due to age or medical condition  Airway Management Planned: Oral ETT  Additional Equipment:   Intra-op Plan:   Post-operative Plan: Extubation in OR  Informed Consent: I have reviewed the patients History and Physical, chart, labs and discussed the procedure including the risks, benefits and alternatives for the proposed anesthesia with the patient or authorized representative who has indicated his/her understanding and acceptance.     Plan Discussed with: CRNA, Anesthesiologist and Surgeon  Anesthesia Plan Comments:         Anesthesia Quick Evaluation

## 2017-08-18 NOTE — Anesthesia Postprocedure Evaluation (Signed)
Anesthesia Post Note  Patient: Alyssa Martin  Procedure(s) Performed: OPEN REDUCTION INTERNAL FIXATION (ORIF) LEFT PROXIMAL HUMERUS FRACTURE (Left Shoulder)     Patient location during evaluation: PACU Anesthesia Type: General Level of consciousness: awake and alert Pain management: pain level controlled Vital Signs Assessment: post-procedure vital signs reviewed and stable Respiratory status: spontaneous breathing, nonlabored ventilation, respiratory function stable and patient connected to nasal cannula oxygen Cardiovascular status: blood pressure returned to baseline and stable Postop Assessment: no apparent nausea or vomiting Anesthetic complications: no    Last Vitals:  Vitals:   08/18/17 2115 08/18/17 2135  BP: (!) 106/56 107/62  Pulse: 87 90  Resp: 14 16  Temp:  37.1 C  SpO2: 94% 96%    Last Pain:  Vitals:   08/18/17 2135  TempSrc: Oral  PainSc:                  [name]

## 2017-08-19 MED ORDER — METHOCARBAMOL 500 MG PO TABS: 500.0000 mg | ORAL_TABLET | Freq: Four times a day (QID) | ORAL | 0 refills | Status: DC | PRN

## 2017-08-19 MED ORDER — RIVAROXABAN 20 MG PO TABS: 10.0000 mg | ORAL_TABLET | Freq: Every day | ORAL | 1 refills | Status: DC

## 2017-08-19 NOTE — Evaluation (Signed)
Occupational Therapy Evaluation Patient Details Name: Alyssa Martin MRN: [id number] DOB: 1946-01-23 Today's Date: 08/19/2017    History of Present Illness Pt fell [place] walking in the park on [place]23, resulting in R non displaced malleolus fx and 3 part L proximal humerus fx. Pt underwent ORIF of humerus 11/29. PMH: afib, HTN, anxiety, arthritis.   Clinical Impression   Pt has been walking with a cane and performing ADL and some light cooking modified independently since her fall one week ago. She will have assist of her daughter at home. Pt with soft BP this morning, mobility limited to transfers x 2. Pt likely to progress well. Will follow acutely.    Follow Up Recommendations  No OT follow up    Equipment Recommendations  None recommended by OT    Recommendations for Other Services       Precautions / Restrictions Precautions Precautions: Fall;Shoulder Type of Shoulder Precautions: conservative Shoulder Interventions: Shoulder sling/immobilizer;Off for dressing/bathing/exercises Precaution Booklet Issued: Yes (comment) Required Braces or Orthoses: Sling Restrictions Weight Bearing Restrictions: Yes LUE Weight Bearing: Non weight bearing      Mobility Bed Mobility Overal bed mobility: Modified Independent             General bed mobility comments: HOB up, got up to R side  Transfers Overall transfer level: Needs assistance Equipment used: Straight cane Transfers: Sit to/from [facility] Sit to Stand: Min guard Stand pivot transfers: Min guard            Balance                                           ADL either performed or assessed with clinical judgement   ADL Overall ADL's : Needs assistance/impaired Eating/Feeding: Set up;Sitting   Grooming: Set up;Sitting   Upper Body Bathing: Minimal assistance;Sitting   Lower Body Bathing: Min guard;Sit to/from stand   Upper Body Dressing : Minimal  assistance;Sitting Upper Body Dressing Details (indicate cue type and reason): instructed in compensatory strategies Lower Body Dressing: Min guard;Sit to/from stand Lower Body Dressing Details (indicate cue type and reason): pt able to don CAM boot in long sitting in bed Toilet Transfer: Min guard;Stand-pivot;BSC   Toileting- Clothing Manipulation and Hygiene: Min guard;Sit to/from stand       Functional mobility during ADLs: (did not ambulate due to soft BP) General ADL Comments: Educated in sling donning and doffing and positioning of shoulder in bed and chair.     Vision Baseline Vision/History: Wears glasses Wears Glasses: Reading only       Perception     Praxis      Pertinent Vitals/Pain Pain Assessment: 0-10 Pain Score: 4  Pain Location: L shoulder Pain Descriptors / Indicators: Sore Pain Intervention(s): Monitored during session;Repositioned;Ice applied     Hand Dominance Right   Extremity/Trunk Assessment Upper Extremity Assessment Upper Extremity Assessment: RUE deficits/detail RUE Deficits / Details: shoulder immobilized, full AROM elbow to hand RUE: Unable to fully assess due to immobilization RUE Coordination: decreased gross motor   Lower Extremity Assessment Lower Extremity Assessment: Defer to PT evaluation   Cervical / Trunk Assessment Cervical / Trunk Assessment: Normal   Communication Communication Communication: No difficulties   Cognition Arousal/Alertness: Awake/alert Behavior During Therapy: WFL for tasks assessed/performed Overall Cognitive Status: Within Functional Limits for tasks assessed  General Comments       Exercises     Shoulder Instructions      Home Living Family/patient expects to be discharged to:: Private residence Living Arrangements: Alone Available Help at Discharge: Family;Available 24 hours/day Type of Home: House Home Access: Stairs to enter [employer] of Steps: 2 Entrance Stairs-Rails: Right;Left;Can reach both Home Layout: Two level;1/2 bath on main level Alternate Level Stairs-Number of Steps: 2+10 Alternate Level Stairs-Rails: Right;Left Bathroom Shower/Tub: Occupational psychologist: Standard     Home Equipment: Environmental consultant - 2 wheels;Cane - single point;Shower seat;Tub bench   Additional Comments: equipment is from her husband who has since died      Prior Functioning/Environment Level of Independence: Independent with assistive device(s)        Comments: pt has been walking with a cane since her fall        OT Problem List: Impaired balance (sitting and/or standing);Decreased range of motion;Decreased coordination;Decreased knowledge of use of DME or AE;Pain      OT Treatment/Interventions: Self-care/ADL training;Therapeutic exercise;Therapeutic activities;Patient/family education    OT Goals(Current goals can be found in the care plan section) Acute Rehab OT Goals Patient Stated Goal: to return home with assist of her daughter OT Goal Formulation: With patient Time For Goal Achievement: 08/26/17 Potential to Achieve Goals: Good ADL Goals Pt/caregiver will Perform Home Exercise Program: Increased ROM;Independently(elbow to hand AROM) Additional ADL Goal #1: Pt will perform ADL modified independently adhering to shoulder precautions. Additional ADL Goal #2: Pt will don and doff shoulder immobilizer with min assist and caregiver helping.  OT Frequency: Min 2X/week   Barriers to D/C:            Co-evaluation              AM-PAC PT "6 Clicks" Daily Activity     Outcome Measure Help from another person eating meals?: None Help from another person taking care of personal grooming?: A Little Help from another person toileting, which includes using toliet, bedpan, or urinal?: A Little Help from another person bathing (including washing, rinsing, drying)?: A Little Help from another person to  put on and taking off regular upper body clothing?: A Little Help from another person to put on and taking off regular lower body clothing?: A Little 6 Click Score: 19   End of Session Equipment Utilized During Treatment: Gait belt  Activity Tolerance: Patient tolerated treatment well Patient left: in chair;with call bell/phone within reach  OT Visit Diagnosis: Unsteadiness on feet (R26.81);Pain;History of falling (Z91.81) Pain - Right/Left: Left Pain - part of body: Shoulder                Time: 1025-8527 OT Time Calculation (min): 48 min Charges:  OT General Charges $OT Visit: 1 Visit OT Evaluation $OT Eval Moderate Complexity: 1 Mod OT Treatments $Self Care/Home Management : 23-37 mins G-Codes:     Malka So 08/19/2017, 10:50 AM  08/19/2017 Nestor Lewandowsky, OTR/L Pager: [phone]

## 2017-08-19 NOTE — Evaluation (Signed)
Physical Therapy Evaluation Patient Details Name: Alyssa Martin MRN: [id number] DOB: 14-Jul-1946 Today's Date: 08/19/2017   History of Present Illness  Pt fell while walking in the park on 11/23, resulting in R non displaced malleolus fx and 3 part L proximal humerus fx. Pt underwent ORIF of humerus 11/29. PMH: afib, HTN, anxiety, arthritis.  Clinical Impression  Pt admitted with above diagnosis. Pt currently with functional limitations due to the deficits listed below (see PT Problem List). At the time of PT eval pt was able to perform transfers and ambulation with gross min guard assist to supervision for safety. Pt was able to demonstrate bed mobility at a mod I level. Pt reports being very sleepy this afternoon after she took pain medication. Was not up to attempting stair training this date, so will plan to initiate tomorrow. Anticipate pt will progress well with mobility. Pt will benefit from skilled PT to increase their independence and safety with mobility to allow discharge to the venue listed below.       Follow Up Recommendations Outpatient PT;Supervision for mobility/OOB(MD will order when appropriate)    Equipment Recommendations  None recommended by PT    Recommendations for Other Services       Precautions / Restrictions Precautions Precautions: Fall;Shoulder Type of Shoulder Precautions: conservative Shoulder Interventions: Shoulder sling/immobilizer;Off for dressing/bathing/exercises Precaution Booklet Issued: Yes (comment) Required Braces or Orthoses: Sling;Other Brace/Splint Other Brace/Splint: CAM Boot Restrictions Weight Bearing Restrictions: Yes LUE Weight Bearing: Non weight bearing RLE Weight Bearing: Weight bearing as tolerated(In boot)      Mobility  Bed Mobility Overal bed mobility: Modified Independent             General bed mobility comments: HOB slightly elevated; pt with no difficulty transitioning to/from EOB.   Transfers Overall  transfer level: Needs assistance Equipment used: Straight cane Transfers: Sit to/from Stand Sit to Stand: Supervision         General transfer comment: Pt demonstrated proper hand placement on seated surface for safety. No assist required however close supervision provided for safety.   Ambulation/Gait Ambulation/Gait assistance: Min guard Ambulation Distance (Feet): 200 Feet Assistive device: Straight cane Gait Pattern/deviations: Step-through pattern;Decreased stride length;Trunk flexed Gait velocity: Decreased Gait velocity interpretation: Below normal speed for age/gender General Gait Details: Slightly antalgic due to boot. Recommended a family member bring her a L shoe to allow for more equal gait pattern R to L.  Stairs            Wheelchair Mobility    Modified Rankin (Stroke Patients Only)       Balance Overall balance assessment: Needs assistance Sitting-balance support: Feet supported;No upper extremity supported Sitting balance-Leahy Scale: Good     Standing balance support: During functional activity;No upper extremity supported Standing balance-Leahy Scale: Poor Standing balance comment: Requires 1 UE support for balance during dynamic standing activity.                              Pertinent Vitals/Pain Pain Assessment: Faces Faces Pain Scale: Hurts little more Pain Location: L shoulder Pain Descriptors / Indicators: Sore Pain Intervention(s): Limited activity within patient's tolerance;Monitored during session;Repositioned    Home Living Family/patient expects to be discharged to:: Private residence Living Arrangements: Alone Available Help at Discharge: Family;Available 24 hours/day Type of Home: House Home Access: Stairs to enter Entrance Stairs-Rails: Right;Left;Can reach both Entrance Stairs-Number of Steps: 2 Home Layout: Two level;1/2 bath on main level; 10  steps to 2nd level Home Equipment: Gilford Rile - 2 wheels;Cane - single  point;Shower seat;Tub bench Additional Comments: equipment is from her husband who has since died    Prior Function Level of Independence: Independent with assistive device(s)         Comments: pt has been walking with a cane since her fall     Hand Dominance   Dominant Hand: Right    Extremity/Trunk Assessment   Upper Extremity Assessment Upper Extremity Assessment: Defer to OT evaluation    Lower Extremity Assessment Lower Extremity Assessment: RLE deficits/detail RLE Deficits / Details: Decreased strength and AROM consistent with above mentioned injury.     Cervical / Trunk Assessment Cervical / Trunk Assessment: Normal  Communication   Communication: No difficulties  Cognition Arousal/Alertness: Awake/alert Behavior During Therapy: WFL for tasks assessed/performed Overall Cognitive Status: Within Functional Limits for tasks assessed                                        General Comments      Exercises     Assessment/Plan    PT Assessment Patient needs continued PT services  PT Problem List Decreased strength;Decreased range of motion;Decreased activity tolerance;Decreased balance;Decreased mobility;Decreased knowledge of use of DME;Decreased safety awareness;Decreased knowledge of precautions;Pain       PT Treatment Interventions DME instruction;Gait training;Stair training;Functional mobility training;Therapeutic activities;Therapeutic exercise;Neuromuscular re-education;Patient/family education    PT Goals (Current goals can be found in the Care Plan section)  Acute Rehab PT Goals Patient Stated Goal: to return home with assist of her daughter PT Goal Formulation: With patient Time For Goal Achievement: 08/26/17 Potential to Achieve Goals: Good    Frequency Min 5X/week   Barriers to discharge        Co-evaluation               AM-PAC PT "6 Clicks" Daily Activity  Outcome Measure Difficulty turning over in bed (including  adjusting bedclothes, sheets and blankets)?: A Little Difficulty moving from lying on back to sitting on the side of the bed? : A Little Difficulty sitting down on and standing up from a chair with arms (e.g., wheelchair, bedside commode, etc,.)?: A Little Help needed moving to and from a bed to chair (including a wheelchair)?: A Little Help needed walking in hospital room?: A Little Help needed climbing 3-5 steps with a railing? : A Little 6 Click Score: 18    End of Session Equipment Utilized During Treatment: Gait belt;Other (comment)(Sling) Activity Tolerance: Patient tolerated treatment well Patient left: in bed;with call bell/phone within reach Nurse Communication: Mobility status PT Visit Diagnosis: Unsteadiness on feet (R26.81);History of falling (Z91.81);Pain Pain - Right/Left: Left Pain - part of body: Shoulder    Time: 3570-1779 PT Time Calculation (min) (ACUTE ONLY): 26 min   Charges:   PT Evaluation $PT Eval Moderate Complexity: 1 Mod PT Treatments $Gait Training: 8-22 mins   PT G Codes:        Rolinda Roan, PT, DPT Acute Rehabilitation Services Pager: [phone]   Thelma Comp 08/19/2017, 3:29 PM

## 2017-08-19 NOTE — Discharge Instructions (Signed)

## 2017-08-19 NOTE — Op Note (Signed)
NAMEMICHAELYN, WALL NO.:  [id number]  MEDICAL RECORD NO.:  [id number]  LOCATION:  5N22C                        FACILITY:  [facility]  PHYSICIAN:  Anderson Malta, M.D.    DATE OF BIRTH:  [birth year]  DATE OF PROCEDURE: DATE OF DISCHARGE:                              OPERATIVE REPORT   PREOPERATIVE DIAGNOSIS:  Left proximal humerus fracture.  POSTOPERATIVE DIAGNOSIS:  Left proximal humerus fracture.  PROCEDURE:  Left proximal humerus fracture open reduction and internal fixation.  SURGEON:  Anderson Malta, M.D.  ASSISTANT:  Ky Barban, RNFA.  INDICATIONS:  Daphnee is a 71 year old patient with left proximal humerus fracture, presents for operative management after explanation of risks and benefits.  PROCEDURE IN DETAIL:  The patient was brought into the operating room, where general anesthetic was induced.  Preoperative IV antibiotics were administered.  Time-out was called.  The patient was placed on the bed, and her left arm, shoulder, and hand were prescrubbed with alcohol and Betadine and allowed to air dry, prepped with DuraPrep solution and draped in a sterile manner.  Charlie Pitter was used to cover the entire operative field.  Time-out was called.  Deltopectoral approach was made. Plane between the pectoralis and deltoid was identified.  There was no cephalic vein.  Deltoid was partially released off the anterior third. This relaxed enough to achieve visualization.  The fracture lines were identified.  Two stay sutures were placed into the muscle tendon junction of the greater tuberosity fragment, which was mobilized anteriorly.  The head was disengaged through the fracture site to allow for distraction and posterior reduction of the shaft relative to the head.  This gave a nice reduction.  The plate was then applied and positioned in such a manner that it was approximately 1.5 cm below the articular surface of the humeral head.  Guide pins were placed.   The plate was placed lateral to the bicipital groove.  Following this, the reduction was checked and the plate placement was checked.  Screws were then placed first on the shaft distally, then in the head proximally. Pegs were utilized.  No cortical penetration was occurred.  Every effort was made to maintain soft tissue attachments.  Indirect reduction techniques were utilized.  AP and lateral fluoroscopy demonstrated good placement of the screws.  Fracture moved as a unit.  At this time, thorough irrigation was performed.  The sutures were then tied to the superior aspect of the plate to reinforce the greater tuberosity fragment piece.  Following this, thorough irrigation performed.  Deltopectoral split closed using #1 Vicryl suture followed by interrupted inverted 2-0 Vicryl suture and 3-0 Monocryl.  Aquacel dressing and sling applied.  The patient tolerated the procedure well without immediate complications.  Transferred to the recovery room in stable condition.     Anderson Malta, M.D.     GSD/MEDQ  D:  08/18/2017  T:  08/19/2017  Job:  [id number]

## 2017-08-19 NOTE — Progress Notes (Signed)
Subjective: Pt stable - pain ok   Objective: Vital signs in last 24 hours: Temp:  [97 F (36.1 C)-98.7 F (37.1 C)] 98.2 F (36.8 C) (11/30 0636) Pulse Rate:  [65-108] 76 (11/30 0636) Resp:  [10-22] 16 (11/30 0636) BP: (98-128)/(41-65) 98/46 (11/30 0636) SpO2:  [92 %-100 %] 96 % (11/30 0636) Weight:  [112 lb (50.8 kg)] 112 lb (50.8 kg) (11/29 1405)  Intake/Output from previous day: 11/29 0701 - 11/30 0700 In: 2683 [P.O.:240; I.V.:1550] Out: 200 [Blood:200] Intake/Output this shift: No intake/output data recorded.  Exam:  No cellulitis present Compartment soft  Labs: No results for input(s): HGB in the last 72 hours. No results for input(s): WBC, RBC, HCT, PLT in the last 72 hours. No results for input(s): NA, K, CL, CO2, BUN, CREATININE, GLUCOSE, CALCIUM in the last 72 hours. Recent Labs    08/18/17 1344  INR 0.99    Assessment/Plan: Plan dc sat or sun - rx on chart   G Alphonzo Severance 08/19/2017, 7:44 AM

## 2017-08-20 NOTE — Discharge Planning (Addendum)
Patient IV removed. RN assessment and VS revealed stability for DC to home w/ HH.   Discharge papers printed, explained and educated.  Informed of suggested FU appt and appt made.  Scripts called in to [facility] [address] [address] in [place] (per patient's request) o- pain scipt signed and also given to patient.  Patient will FU with ortho and discuss further PT/OT needs at that time - patient aware. Once ready, will be wheeled to front and family transporting home via car.

## 2017-08-20 NOTE — Progress Notes (Signed)
Occupational Therapy Treatment Patient Details Name: Alyssa Martin MRN: [id number] DOB: Apr 26, 1946 Today's Date: 08/20/2017    History of present illness Pt fell while walking in the park on 11/23, resulting in R non displaced malleolus fx and 3 part L proximal humerus fx. Pt underwent ORIF of humerus 11/29. PMH: afib, HTN, anxiety, arthritis.   OT comments  Pt. Able to complete RUE arom to digits, wrist, and elbow. Also toileting tasks.  Will continue to follow, reports she will have help from her dtr. At home.   Follow Up Recommendations  No OT follow up    Equipment Recommendations  None recommended by OT    Recommendations for Other Services      Precautions / Restrictions Precautions Precautions: Fall;Shoulder Type of Shoulder Precautions: conservative Shoulder Interventions: Shoulder sling/immobilizer;Off for dressing/bathing/exercises Required Braces or Orthoses: Sling;Other Brace/Splint Other Brace/Splint: CAM Boot Restrictions Weight Bearing Restrictions: Yes LUE Weight Bearing: Non weight bearing RLE Weight Bearing: Weight bearing as tolerated       Mobility Bed Mobility                  Transfers                      Balance                                           ADL either performed or assessed with clinical judgement   ADL Overall ADL's : Needs assistance/impaired     Grooming: Wash/dry hands;Supervision/safety           Upper Body Dressing : Minimal assistance;Sitting Upper Body Dressing Details (indicate cue type and reason): instructed in compensatory strategies, educated on safe don/doff of sling   Lower Body Dressing Details (indicate cue type and reason): pt able to don CAM boot in long sitting in chair Toilet Transfer: Min guard;Comfort height toilet Toilet Transfer Details (indicate cue type and reason): used spc for ambulation           General ADL Comments: Educated in sling donning and  doffing and positioning of shoulder in bed and chair.     Vision       Perception     Praxis      Cognition                                                Exercises Other Exercises Other Exercises: pt. performed passive and active rom to L UE digits, wrist, and elbow   Shoulder Instructions       General Comments      Pertinent Vitals/ Pain          Home Living                                          Prior Functioning/Environment              Frequency  Min 2X/week        Progress Toward Goals  OT Goals(current goals can now be found in the care plan section)  Progress towards OT goals: Progressing toward goals     Plan  Co-evaluation                 AM-PAC PT "6 Clicks" Daily Activity     Outcome Measure   Help from another person eating meals?: None Help from another person taking care of personal grooming?: A Little Help from another person toileting, which includes using toliet, bedpan, or urinal?: A Little Help from another person bathing (including washing, rinsing, drying)?: A Little Help from another person to put on and taking off regular upper body clothing?: A Little Help from another person to put on and taking off regular lower body clothing?: A Little 6 Click Score: 19    End of Session Equipment Utilized During Treatment: Other (comment)(spc)  OT Visit Diagnosis: Unsteadiness on feet (R26.81);Pain;History of falling (Z91.81) Pain - Right/Left: Left Pain - part of body: Shoulder   Activity Tolerance Patient tolerated treatment well   Patient Left in chair;with call bell/phone within reach   Nurse Communication          Time: 0940-1001 OT Time Calculation (min): 21 min  Charges: OT General Charges $OT Visit: 1 Visit OT Treatments $Self Care/Home Management : 8-22 mins   Janice Coffin, COTA/L 08/20/2017, 11:37 AM

## 2017-08-20 NOTE — Progress Notes (Signed)
Subjective: 2 Days Post-Op Procedure(s) (LRB): OPEN REDUCTION INTERNAL FIXATION (ORIF) LEFT PROXIMAL HUMERUS FRACTURE (Left) Patient reports pain as mild.    Objective: Vital signs in last 24 hours: Temp:  [98.2 F (36.8 C)-98.9 F (37.2 C)] 98.9 F (37.2 C) (12/01 0300) Pulse Rate:  [67-78] 67 (12/01 0300) Resp:  [16] 16 (12/01 0300) BP: (107-114)/(51-57) 114/57 (12/01 0300) SpO2:  [95 %-97 %] 95 % (12/01 0300)  Intake/Output from previous day: 11/30 0701 - 12/01 0700 In: 1170.7 [P.O.:360; I.V.:810.7] Out: 1 [Urine:1] Intake/Output this shift: Total I/O In: 120 [P.O.:120] Out: 2 [Urine:2]  No results for input(s): HGB in the last 72 hours. No results for input(s): WBC, RBC, HCT, PLT in the last 72 hours. No results for input(s): NA, K, CL, CO2, BUN, CREATININE, GLUCOSE, CALCIUM in the last 72 hours. Recent Labs    08/18/17 1344  INR 0.99    Neurovascular intact Sensation intact distally Intact pulses distally Incision: dressing C/D/I  Assessment/Plan: 2 Days Post-Op Procedure(s) (LRB): OPEN REDUCTION INTERNAL FIXATION (ORIF) LEFT PROXIMAL HUMERUS FRACTURE (Left) Advance diet Discharge home with home health  Biagio Borg 08/20/2017, 10:45 AM

## 2017-08-20 NOTE — Progress Notes (Signed)
Physical Therapy Treatment Patient Details Name: Alyssa Martin MRN: [id number] DOB: [birth year] Today's Date: 08/20/2017    History of Present Illness Pt fell [place] walking in the park on 11[place]23, resulting in R non displaced malleolus fx and 3 part L proximal humerus fx. Pt underwent ORIF of humerus 11/29. PMH: afib, HTN, anxiety, arthritis.    PT Comments    Continuing work on functional mobility and activity tolerance;  Excellent progress of gait distance, and we covered options for getting up and down her steps to access full bathroom   Follow Up Recommendations  Outpatient PT;Supervision for mobility/OOB(MD will order when appropriate)     Equipment Recommendations  None recommended by PT    Recommendations for Other Services       Precautions / Restrictions Precautions Precautions: Fall;Shoulder Type of Shoulder Precautions: conservative Shoulder Interventions: Shoulder sling/immobilizer;Off for dressing/bathing/exercises Precaution Booklet Issued: Yes (comment) Required Braces or Orthoses: Sling;Other Brace/Splint Other Brace/Splint: CAM Boot Restrictions LUE Weight Bearing: Non weight bearing RLE Weight Bearing: Weight bearing as tolerated    Mobility  Bed Mobility                  Transfers Overall transfer level: Needs assistance Equipment used: Straight cane Transfers: Sit to/from Stand Sit to Stand: Supervision         General transfer comment: Pt demonstrated proper hand placement on seated surface for safety. No assist required however close supervision provided for safety.   Ambulation/Gait Ambulation/Gait assistance: Min guard;Supervision Ambulation Distance (Feet): 280 Feet Assistive device: Straight cane Gait Pattern/deviations: Step-through pattern;Decreased stride length     General Gait Details: Cues to self-monitor for activity tolerance; excellent use of cane   Stairs Stairs: Yes   Stair Management: One rail Left;Step to  pattern;Forwards Number of Stairs: 2(x4) General stair comments: Practiced sequencing steps, and we found a sequence that works for her: R hand on rail, and lead ascending with L foot, descending lead with R foot  Wheelchair Mobility    Modified Rankin (Stroke Patients Only)       Balance     Sitting balance-Leahy Scale: Good         Standing balance comment: Requires 1 UE support for balance during dynamic standing activity.                             Cognition Arousal/Alertness: Awake/alert Behavior During Therapy: WFL for tasks assessed/performed Overall Cognitive Status: Within Functional Limits for tasks assessed                                        Exercises Other Exercises Other Exercises: pt. performed passive and active rom to L UE digits, wrist, and elbow    General Comments        Pertinent Vitals/Pain Pain Assessment: Faces Faces Pain Scale: Hurts a little bit Pain Location: L shoulder Pain Descriptors / Indicators: Sore Pain Intervention(s): Monitored during session;Limited activity within patient's tolerance    Home Living                      Prior Function            PT Goals (current goals can now be found in the care plan section) Acute Rehab PT Goals Patient Stated Goal: to return home with assist of her daughter PT Goal  Formulation: With patient Time For Goal Achievement: 08/26/17 Potential to Achieve Goals: Good Progress towards PT goals: Progressing toward goals    Frequency    Min 5X/week      PT Plan Current plan remains appropriate    Co-evaluation              AM-PAC PT "6 Clicks" Daily Activity  Outcome Measure  Difficulty turning over in bed (including adjusting bedclothes, sheets and blankets)?: A Little Difficulty moving from lying on back to sitting on the side of the bed? : A Little Difficulty sitting down on and standing up from a chair with arms (e.g., wheelchair,  bedside commode, etc,.)?: A Little Help needed moving to and from a bed to chair (including a wheelchair)?: A Little Help needed walking in hospital room?: A Little Help needed climbing 3-5 steps with a railing? : A Little 6 Click Score: 18    End of Session Equipment Utilized During Treatment: Gait belt(Sling) Activity Tolerance: Patient tolerated treatment well Patient left: in chair;with call bell/phone within reach Nurse Communication: Mobility status PT Visit Diagnosis: Unsteadiness on feet (R26.81);History of falling (Z91.81);Pain Pain - Right/Left: Left Pain - part of body: Shoulder     Time: 9753-0051 PT Time Calculation (min) (ACUTE ONLY): 26 min  Charges:  $Gait Training: 23-37 mins                    G Codes:       Roney Marion, PT  Acute Rehabilitation Services Pager [phone] Office [phone]    [place] 08/20/2017, 2:38 PM

## 2017-08-20 NOTE — Care Management Note (Signed)
Case Management Note  Patient Details  Name: KENIYA SCHLOTTERBECK MRN: [id number] Date of Birth: 1945-12-20  Subjective/Objective:     Spoke with pt who is unsure if she needs HHPT at this time as she is completely Independent, however MD note indicated [facility]. PT/OT eval indicated Outpatient PT. PT will contact MD office on Monday for follow up and make arrangements through his office at that time. Does not wish CM to assist with this at present.                Action/Plan:CM will sign off for now but will be available should additional discharge needs arise or disposition change.    Expected Discharge Date:  08/20/17               Expected Discharge Plan:     In-House Referral:     Discharge planning Services  CM Consult  Post Acute Care Choice:  NA Choice offered to:  Patient  DME Arranged:    DME Agency:     HH Arranged:    [place] Agency:     Status of Service:  Completed, signed off  If discussed at [employer] of Stay Meetings, dates discussed:    Additional Comments:  Delrae Sawyers, RN 08/20/2017, 12:37 PM

## 2017-08-21 ENCOUNTER — Encounter (HOSPITAL_COMMUNITY): Payer: Self-pay | Admitting: Orthopedic Surgery

## 2017-08-22 ENCOUNTER — Telehealth (INDEPENDENT_AMBULATORY_CARE_PROVIDER_SITE_OTHER): Payer: Self-pay | Admitting: Orthopedic Surgery

## 2017-08-22 NOTE — Telephone Encounter (Signed)
Patient would like to know if she should continue with abx for UTI? Please advise thanks.

## 2017-08-22 NOTE — Telephone Encounter (Signed)
IC s/w patient and advised. She verbalized understanding.  

## 2017-08-22 NOTE — Telephone Encounter (Signed)
no

## 2017-08-22 NOTE — Telephone Encounter (Signed)
Patient would like a call back regarding instructions for post op. CB # [phone]

## 2017-08-24 NOTE — Discharge Summary (Signed)
Physician Discharge Summary  Patient ID: Alyssa Martin MRN: [id number] DOB/AGE: 04-26-1946 71 y.o.  Admit date: 08/18/2017 Discharge date: 08/20/2017  Admission Diagnoses:  Active Problems:   Shoulder fracture, left   Discharge Diagnoses:  Same  Surgeries: Procedure(s): OPEN REDUCTION INTERNAL FIXATION (ORIF) LEFT PROXIMAL HUMERUS FRACTURE on 08/18/2017   Consultants:   Discharged Condition: Stable  Hospital Course: Alyssa Martin is an 71 y.o. female who was admitted 08/18/2017 with a chief complaint of left shoulder pain, and found to have a diagnosis of left proximal humerus fracture.  They were brought to the operating room on 08/18/2017 and underwent the above named procedures.  Patient tolerated the procedure well.  Motor sensory function to the hand intact on postop day #1.  She was mobilized with physical therapy and occupational therapy until she was safe to be discharged home with a good family support network.  She will follow-up with me in 7 days for recheck.  Pain controlled at time of discharge  Antibiotics given:  Anti-infectives (From admission, onward)   Start     Dose/Rate Route Frequency Ordered Stop   08/19/17 0600  clindamycin (CLEOCIN) IVPB 900 mg     900 mg 100 mL/hr over 30 Minutes Intravenous On call to O.R. 08/18/17 1428 08/18/17 1716   08/18/17 2300  vancomycin (VANCOCIN) IVPB 1000 mg/200 mL premix     1,000 mg 200 mL/hr over 60 Minutes Intravenous Every 12 hours 08/18/17 2136 08/19/17 0121   08/18/17 1430  clindamycin (CLEOCIN) 900 MG/50ML IVPB    Comments:  Forte, Lindsi   : cabinet override      08/18/17 1430 08/18/17 1701    .  Recent vital signs:  Vitals:   08/20/17 0300 08/20/17 1300  BP: (!) 114/57 (!) 112/54  Pulse: 67 70  Resp: 16 16  Temp: 98.9 F (37.2 C) 98.7 F (37.1 C)  SpO2: 95% 98%    Recent laboratory studies:  Results for orders placed or performed during the hospital encounter of 08/18/17  Protime-INR  Result Value  Ref Range   Prothrombin Time 13.0 11.4 - 15.2 seconds   INR 0.99     Discharge Medications:   Allergies as of 08/20/2017      Reactions   Penicillins    UNSPECIFIED REACTION    Ativan [lorazepam] Nausea Only      Medication List    STOP taking these medications   HYDROcodone-acetaminophen 5-325 MG tablet Commonly known as:  NORCO/VICODIN   nitrofurantoin 100 MG capsule Commonly known as:  MACRODANTIN     TAKE these medications   CITRACAL PO Take 630 mg by mouth 2 (two) times daily.   DILT-XR 180 MG 24 hr capsule Generic drug:  diltiazem TAKE 1 CAPSULE BY MOUTH EVERY DAY   ergocalciferol 50000 units capsule Commonly known as:  VITAMIN D2 Take 50,000 Units by mouth every 14 (fourteen) days.   loratadine 10 MG tablet Commonly known as:  CLARITIN Take 10 mg by mouth daily as needed for allergies.   methocarbamol 500 MG tablet Commonly known as:  ROBAXIN Take 1 tablet (500 mg total) by mouth every 6 (six) hours as needed for muscle spasms.   multivitamin tablet Take 1 tablet by mouth daily.   NASONEX 50 MCG/ACT nasal spray Generic drug:  mometasone Place 2 sprays into the nose daily as needed (allergies).   omeprazole 20 MG capsule Commonly known as:  PRILOSEC Take 20 mg by mouth daily as needed (heartburn).   PREMARIN vaginal  cream Generic drug:  conjugated estrogens APP 1 GRAM VAGINALLY 2 TIMES A WK   ranitidine 150 MG tablet Commonly known as:  ZANTAC Take 150 mg by mouth 2 (two) times daily as needed for heartburn.   rivaroxaban 20 MG Tabs tablet Commonly known as:  XARELTO Take 1 tablet (20 mg total) by mouth daily with supper.   simvastatin 20 MG tablet Commonly known as:  ZOCOR Take 20 mg by mouth every evening.   vitamin C 500 MG tablet Commonly known as:  ASCORBIC ACID Take 500 mg by mouth daily.   zoledronic acid 5 MG/100ML Soln injection Commonly known as:  RECLAST Inject 5 mg into the vein once. Yearly       Diagnostic Studies: Dg  Ankle Complete Right  Result Date: 08/12/2017 CLINICAL DATA:  Lateral right ankle pain since an injury due to a fall today. Initial encounter. EXAM: RIGHT ANKLE - COMPLETE 3+ VIEW COMPARISON:  None. FINDINGS: The patient has a fracture of the lateral malleolus. There is a small distracted fracture fragment off the tip of the lateral malleolus. On the oblique view, nondisplaced fracture line appears to extend cephalad to the level of the tibiotalar joint. No other acute bone abnormality is identified. Soft tissue swelling about the lateral malleolus and ankle joint effusion noted. Calcaneal spur is identified. IMPRESSION: Acute lateral malleolus fracture as described above. Electronically Signed   By: Inge Rise M.D.   On: 08/12/2017 15:13   Ct Head Wo Contrast  Result Date: 08/12/2017 CLINICAL DATA:  Fall while walking. No head injury. Patient does have neck pain. EXAM: CT HEAD WITHOUT CONTRAST CT CERVICAL SPINE WITHOUT CONTRAST TECHNIQUE: Multidetector CT imaging of the head and cervical spine was performed following the standard protocol without intravenous contrast. Multiplanar CT image reconstructions of the cervical spine were also generated. COMPARISON:  None. FINDINGS: CT HEAD FINDINGS Brain: No evidence of acute infarction, hemorrhage, hydrocephalus, extra-axial collection or mass lesion/mass effect. Vascular: No hyperdense vessel or unexpected calcification. Skull: Normal. Negative for fracture or focal lesion. Sinuses/Orbits: No acute finding. Other: None. CT CERVICAL SPINE FINDINGS Alignment: Normal. Skull base and vertebrae: No acute fracture. No primary bone lesion or focal pathologic process. Mild spondylosis is present to include facet arthropathy. Soft tissues and spinal canal: No prevertebral fluid or swelling. No visible canal hematoma. Disc levels:  Within normal. Upper chest: No acute findings. Other: None. IMPRESSION: No acute intracranial findings. No acute cervical spine injury.  Mild spondylosis of the cervical spine. Electronically Signed   By: Marin Olp M.D.   On: 08/12/2017 16:08   Ct Cervical Spine Wo Contrast  Result Date: 08/12/2017 CLINICAL DATA:  Fall while walking. No head injury. Patient does have neck pain. EXAM: CT HEAD WITHOUT CONTRAST CT CERVICAL SPINE WITHOUT CONTRAST TECHNIQUE: Multidetector CT imaging of the head and cervical spine was performed following the standard protocol without intravenous contrast. Multiplanar CT image reconstructions of the cervical spine were also generated. COMPARISON:  None. FINDINGS: CT HEAD FINDINGS Brain: No evidence of acute infarction, hemorrhage, hydrocephalus, extra-axial collection or mass lesion/mass effect. Vascular: No hyperdense vessel or unexpected calcification. Skull: Normal. Negative for fracture or focal lesion. Sinuses/Orbits: No acute finding. Other: None. CT CERVICAL SPINE FINDINGS Alignment: Normal. Skull base and vertebrae: No acute fracture. No primary bone lesion or focal pathologic process. Mild spondylosis is present to include facet arthropathy. Soft tissues and spinal canal: No prevertebral fluid or swelling. No visible canal hematoma. Disc levels:  Within normal. Upper chest: No  acute findings. Other: None. IMPRESSION: No acute intracranial findings. No acute cervical spine injury. Mild spondylosis of the cervical spine. Electronically Signed   By: Marin Olp M.D.   On: 08/12/2017 16:08   Ct Shoulder Left Wo Contrast  Result Date: 08/16/2017 CLINICAL DATA:  The patient suffered a proximal left humerus fracture while walking 08/12/2017. Initial encounter. EXAM: CT OF THE UPPER LEFT EXTREMITY WITHOUT CONTRAST TECHNIQUE: Multidetector CT imaging of the upper left extremity was performed according to the standard protocol. COMPARISON:  Plain films left shoulder 08/12/2017 FINDINGS: Bones/Joint/Cartilage The patient has a surgical neck fracture of the left humerus with impaction of approximately 1.5 cm  and 1/2 shaft width anterior displacement. The fracture involves the greater tuberosity which is mildly comminuted with slight superior and posterior displacement. Retraction the rotator cuff results and angulation greater tuberosity for the midline. The fracture also includes a nondisplaced component through the lesser tuberosity. The acromioclavicular joint is intact and unremarkable. The acromion is type 2. No other fracture is identified. Ligaments Suboptimally assessed by CT. Muscles and Tendons The rotator cuff appears intact. No muscular atrophy or focal lesion is identified. Soft tissues There some hematoma formation for patient's fracture. Imaged lung parenchyma is clear. IMPRESSION: Impacted and anteriorly displaced surgical neck fracture of the humerus as described above includes a mildly comminuted and displaced component through the greater tuberosity and a nondisplaced component involving the lesser tuberosity. Electronically Signed   By: Inge Rise M.D.   On: 08/16/2017 07:20   Dg Shoulder Left  Result Date: 08/18/2017 CLINICAL DATA:  71 y/o  F; ORIF of left proximal humerus fracture. EXAM: DG C-ARM 61-120 MIN; LEFT SHOULDER - 2+ VIEW COMPARISON:  08/12/2017 shoulder radiographs FINDINGS: Fluoro time 21 seconds. 2 intraoperative fluoroscopic views of plate fixation of proximal humerus fracture with near anatomic alignment post fixation. IMPRESSION: Intraoperative fluoroscopy of ORIF of left proximal humerus fracture. Fluoro time is 21 seconds. Electronically Signed   By: Kristine Garbe M.D.   On: 08/18/2017 21:07   Dg Shoulder Left  Result Date: 08/12/2017 CLINICAL DATA:  Fall today while walking landing on left shoulder. EXAM: LEFT SHOULDER - 2+ VIEW COMPARISON:  None. FINDINGS: Examination demonstrates a displaced slightly comminuted humeral neck fracture extending to the humeral head to the region of the greater tuberosity. Remainder of the exam is unremarkable.  IMPRESSION: Displaced humeral neck fracture with mild comminution extending into the humeral head. Electronically Signed   By: Marin Olp M.D.   On: 08/12/2017 15:14   Dg Shoulder Left Port  Result Date: 08/18/2017 CLINICAL DATA:  71 y/o  F; postop left proximal humerus fixation. EXAM: LEFT SHOULDER - 1 VIEW COMPARISON:  None. FINDINGS: Single frontal view of the left shoulder demonstrates plate and screw fixation of left proximal humerus fracture in anatomic alignment. Glenohumeral joint is well maintained. No new fracture identified. Normal acromioclavicular and coracoclavicular intervals. Mild edema of the shoulder soft tissue from surgical changes. IMPRESSION: Left proximal humerus fracture fixation without apparent hardware related complication. Electronically Signed   By: Kristine Garbe M.D.   On: 08/18/2017 22:10   Dg C-arm 61-120 Min  Result Date: 08/18/2017 CLINICAL DATA:  71 y/o  F; ORIF of left proximal humerus fracture. EXAM: DG C-ARM 61-120 MIN; LEFT SHOULDER - 2+ VIEW COMPARISON:  08/12/2017 shoulder radiographs FINDINGS: Fluoro time 21 seconds. 2 intraoperative fluoroscopic views of plate fixation of proximal humerus fracture with near anatomic alignment post fixation. IMPRESSION: Intraoperative fluoroscopy of ORIF of left proximal humerus  fracture. Fluoro time is 21 seconds. Electronically Signed   By: Kristine Garbe M.D.   On: 08/18/2017 21:07    Disposition: 01-Home or Self Care       Signed: Anderson Malta 08/24/2017, 3:38 PM

## 2017-08-25 DIAGNOSIS — M1991 Primary osteoarthritis, unspecified site: Secondary | ICD-10-CM | POA: Diagnosis not present

## 2017-08-25 DIAGNOSIS — I10 Essential (primary) hypertension: Secondary | ICD-10-CM | POA: Diagnosis not present

## 2017-08-25 DIAGNOSIS — Z7901 Long term (current) use of anticoagulants: Secondary | ICD-10-CM | POA: Diagnosis not present

## 2017-08-25 DIAGNOSIS — M8580 Other specified disorders of bone density and structure, unspecified site: Secondary | ICD-10-CM | POA: Diagnosis not present

## 2017-08-25 DIAGNOSIS — S82899D Other fracture of unspecified lower leg, subsequent encounter for closed fracture with routine healing: Secondary | ICD-10-CM | POA: Diagnosis not present

## 2017-08-25 DIAGNOSIS — E785 Hyperlipidemia, unspecified: Secondary | ICD-10-CM | POA: Diagnosis not present

## 2017-08-25 DIAGNOSIS — Z79891 Long term (current) use of opiate analgesic: Secondary | ICD-10-CM | POA: Diagnosis not present

## 2017-08-25 DIAGNOSIS — I480 Paroxysmal atrial fibrillation: Secondary | ICD-10-CM | POA: Diagnosis not present

## 2017-08-25 DIAGNOSIS — S42202D Unspecified fracture of upper end of left humerus, subsequent encounter for fracture with routine healing: Secondary | ICD-10-CM | POA: Diagnosis not present

## 2017-08-25 DIAGNOSIS — Z9181 History of falling: Secondary | ICD-10-CM | POA: Diagnosis not present

## 2017-08-25 DIAGNOSIS — F418 Other specified anxiety disorders: Secondary | ICD-10-CM | POA: Diagnosis not present

## 2017-08-25 DIAGNOSIS — M810 Age-related osteoporosis without current pathological fracture: Secondary | ICD-10-CM | POA: Diagnosis not present

## 2017-08-25 DIAGNOSIS — K219 Gastro-esophageal reflux disease without esophagitis: Secondary | ICD-10-CM | POA: Diagnosis not present

## 2017-08-30 DIAGNOSIS — M810 Age-related osteoporosis without current pathological fracture: Secondary | ICD-10-CM | POA: Diagnosis not present

## 2017-08-30 DIAGNOSIS — Z79891 Long term (current) use of opiate analgesic: Secondary | ICD-10-CM | POA: Diagnosis not present

## 2017-08-30 DIAGNOSIS — I480 Paroxysmal atrial fibrillation: Secondary | ICD-10-CM | POA: Diagnosis not present

## 2017-08-30 DIAGNOSIS — I10 Essential (primary) hypertension: Secondary | ICD-10-CM | POA: Diagnosis not present

## 2017-08-30 DIAGNOSIS — Z9181 History of falling: Secondary | ICD-10-CM | POA: Diagnosis not present

## 2017-08-30 DIAGNOSIS — S42202D Unspecified fracture of upper end of left humerus, subsequent encounter for fracture with routine healing: Secondary | ICD-10-CM | POA: Diagnosis not present

## 2017-08-30 DIAGNOSIS — S82899D Other fracture of unspecified lower leg, subsequent encounter for closed fracture with routine healing: Secondary | ICD-10-CM | POA: Diagnosis not present

## 2017-08-30 DIAGNOSIS — K219 Gastro-esophageal reflux disease without esophagitis: Secondary | ICD-10-CM | POA: Diagnosis not present

## 2017-08-30 DIAGNOSIS — Z7901 Long term (current) use of anticoagulants: Secondary | ICD-10-CM | POA: Diagnosis not present

## 2017-08-30 DIAGNOSIS — M8580 Other specified disorders of bone density and structure, unspecified site: Secondary | ICD-10-CM | POA: Diagnosis not present

## 2017-08-30 DIAGNOSIS — E785 Hyperlipidemia, unspecified: Secondary | ICD-10-CM | POA: Diagnosis not present

## 2017-08-30 DIAGNOSIS — F418 Other specified anxiety disorders: Secondary | ICD-10-CM | POA: Diagnosis not present

## 2017-08-30 DIAGNOSIS — M1991 Primary osteoarthritis, unspecified site: Secondary | ICD-10-CM | POA: Diagnosis not present

## 2017-09-01 ENCOUNTER — Encounter (INDEPENDENT_AMBULATORY_CARE_PROVIDER_SITE_OTHER): Payer: Self-pay | Admitting: Orthopedic Surgery

## 2017-09-01 ENCOUNTER — Telehealth (INDEPENDENT_AMBULATORY_CARE_PROVIDER_SITE_OTHER): Payer: Self-pay

## 2017-09-01 ENCOUNTER — Ambulatory Visit (INDEPENDENT_AMBULATORY_CARE_PROVIDER_SITE_OTHER): Payer: PPO

## 2017-09-01 ENCOUNTER — Ambulatory Visit (INDEPENDENT_AMBULATORY_CARE_PROVIDER_SITE_OTHER): Payer: PPO | Admitting: Orthopedic Surgery

## 2017-09-01 DIAGNOSIS — S4292XD Fracture of left shoulder girdle, part unspecified, subsequent encounter for fracture with routine healing: Secondary | ICD-10-CM

## 2017-09-01 NOTE — Telephone Encounter (Signed)
Patient was seen today and Dr Marlou Sa would like to have CPM arranged for patient-using machine 3 hrs/day. I have called Ruby Cola with [employer] and LM for him to call me back to see if he could help me get this arranged for patient.

## 2017-09-01 NOTE — Progress Notes (Signed)
   Post-Op Visit Note   Patient: Alyssa Martin           Date of Birth: Nov 09, 1945           MRN: [id number] Visit Date: 09/01/2017 PCP: Marton Redwood, MD   Assessment & Plan:  Chief Complaint:  Chief Complaint  Patient presents with  . Left Shoulder - Routine Post Op   Visit Diagnoses:  1. Closed fracture of left shoulder with routine healing, subsequent encounter     Plan: Alyssa Martin is a 71 year old patient left proximal humerus fracture fixation 2 weeks ago.  On exam shoulder is a little on the stiff side.  Incision intact and deltoid fires.  Radiographs look good.  Plan discontinue sling.  Shoulder CPM for 2 weeks.  3-week return for clinical recheck.  I will take a feel of the shoulder at that time and decide for or against plain radiographs.  At this time the construct looks stable.  Follow-Up Instructions: Return in about 3 weeks (around 09/22/2017).   Orders:  Orders Placed This Encounter  Procedures  . XR Shoulder Left   No orders of the defined types were placed in this encounter.   Imaging: Xr Shoulder Left  Result Date: 09/01/2017 AP lateral and oblique left shoulder reviewed.  Proximal humerus fracture in good position and alignment with hardware fixation noted.  No dislocation or complicating features seen.   PMFS History: Patient Active Problem List   Diagnosis Date Noted  . Shoulder fracture, left 08/18/2017  . Paroxysmal A-fib ([facility]) 09/08/2012   Past Medical History:  Diagnosis Date  . Arthritis   . Closed fracture of left proximal humerus   . Closed right ankle fracture   . Depression with anxiety   . Elevated BP   . GERD (gastroesophageal reflux disease)   . Hyperlipidemia   . Hypertension   . Impaired fasting glucose   . Osteopenia   . Osteoporosis   . Paroxysmal atrial fibrillation ([facility])   . Vitamin D deficiency   . Wears glasses     Family History  Problem Relation Age of Onset  . Hypertension Mother   . Osteoarthritis Mother   . Bone  cancer Father   . Osteopenia Father   . Lung cancer Brother   . Lung cancer Paternal Grandmother   . Leukemia Paternal Grandfather   . Bone cancer Brother     Past Surgical History:  Procedure Laterality Date  . COLONOSCOPY  05/10/2011  . DIAGNOSTIC MAMMOGRAM  10 /09/2010  . ORIF HUMERUS FRACTURE Left 08/18/2017   Procedure: OPEN REDUCTION INTERNAL FIXATION (ORIF) LEFT PROXIMAL HUMERUS FRACTURE;  Surgeon: Meredith Pel, MD;  Location: [facility];  Service: Orthopedics;  Laterality: Left;  . TIBIA FRACTURE SURGERY  04/2006   Left--From falling down the stairs  . TIBIA FRACTURE SURGERY     In her 25's   Social History   Occupational History  . Occupation: teacher-retired  Tobacco Use  . Smoking status: Never Smoker  . Smokeless tobacco: Never Used  Substance and Sexual Activity  . Alcohol use: No    Alcohol/week: 0.0 oz  . Drug use: No  . Sexual activity: Not on file

## 2017-09-02 NOTE — Telephone Encounter (Signed)
Gave information to [employer] and she will submit to [facility]

## 2017-09-05 DIAGNOSIS — Z9181 History of falling: Secondary | ICD-10-CM | POA: Diagnosis not present

## 2017-09-05 DIAGNOSIS — M810 Age-related osteoporosis without current pathological fracture: Secondary | ICD-10-CM | POA: Diagnosis not present

## 2017-09-05 DIAGNOSIS — F418 Other specified anxiety disorders: Secondary | ICD-10-CM | POA: Diagnosis not present

## 2017-09-05 DIAGNOSIS — Z79891 Long term (current) use of opiate analgesic: Secondary | ICD-10-CM | POA: Diagnosis not present

## 2017-09-05 DIAGNOSIS — M8580 Other specified disorders of bone density and structure, unspecified site: Secondary | ICD-10-CM | POA: Diagnosis not present

## 2017-09-05 DIAGNOSIS — E785 Hyperlipidemia, unspecified: Secondary | ICD-10-CM | POA: Diagnosis not present

## 2017-09-05 DIAGNOSIS — S42202D Unspecified fracture of upper end of left humerus, subsequent encounter for fracture with routine healing: Secondary | ICD-10-CM | POA: Diagnosis not present

## 2017-09-05 DIAGNOSIS — K219 Gastro-esophageal reflux disease without esophagitis: Secondary | ICD-10-CM | POA: Diagnosis not present

## 2017-09-05 DIAGNOSIS — I10 Essential (primary) hypertension: Secondary | ICD-10-CM | POA: Diagnosis not present

## 2017-09-05 DIAGNOSIS — I480 Paroxysmal atrial fibrillation: Secondary | ICD-10-CM | POA: Diagnosis not present

## 2017-09-05 DIAGNOSIS — M1991 Primary osteoarthritis, unspecified site: Secondary | ICD-10-CM | POA: Diagnosis not present

## 2017-09-05 DIAGNOSIS — Z7901 Long term (current) use of anticoagulants: Secondary | ICD-10-CM | POA: Diagnosis not present

## 2017-09-05 DIAGNOSIS — S82899D Other fracture of unspecified lower leg, subsequent encounter for closed fracture with routine healing: Secondary | ICD-10-CM | POA: Diagnosis not present

## 2017-09-12 DIAGNOSIS — E785 Hyperlipidemia, unspecified: Secondary | ICD-10-CM | POA: Diagnosis not present

## 2017-09-12 DIAGNOSIS — Z79891 Long term (current) use of opiate analgesic: Secondary | ICD-10-CM | POA: Diagnosis not present

## 2017-09-12 DIAGNOSIS — I480 Paroxysmal atrial fibrillation: Secondary | ICD-10-CM | POA: Diagnosis not present

## 2017-09-12 DIAGNOSIS — M810 Age-related osteoporosis without current pathological fracture: Secondary | ICD-10-CM | POA: Diagnosis not present

## 2017-09-12 DIAGNOSIS — S42202D Unspecified fracture of upper end of left humerus, subsequent encounter for fracture with routine healing: Secondary | ICD-10-CM | POA: Diagnosis not present

## 2017-09-12 DIAGNOSIS — Z9181 History of falling: Secondary | ICD-10-CM | POA: Diagnosis not present

## 2017-09-12 DIAGNOSIS — M8580 Other specified disorders of bone density and structure, unspecified site: Secondary | ICD-10-CM | POA: Diagnosis not present

## 2017-09-12 DIAGNOSIS — F418 Other specified anxiety disorders: Secondary | ICD-10-CM | POA: Diagnosis not present

## 2017-09-12 DIAGNOSIS — Z7901 Long term (current) use of anticoagulants: Secondary | ICD-10-CM | POA: Diagnosis not present

## 2017-09-12 DIAGNOSIS — I10 Essential (primary) hypertension: Secondary | ICD-10-CM | POA: Diagnosis not present

## 2017-09-12 DIAGNOSIS — M1991 Primary osteoarthritis, unspecified site: Secondary | ICD-10-CM | POA: Diagnosis not present

## 2017-09-12 DIAGNOSIS — K219 Gastro-esophageal reflux disease without esophagitis: Secondary | ICD-10-CM | POA: Diagnosis not present

## 2017-09-12 DIAGNOSIS — S82899D Other fracture of unspecified lower leg, subsequent encounter for closed fracture with routine healing: Secondary | ICD-10-CM | POA: Diagnosis not present

## 2017-09-15 DIAGNOSIS — F418 Other specified anxiety disorders: Secondary | ICD-10-CM | POA: Diagnosis not present

## 2017-09-15 DIAGNOSIS — E785 Hyperlipidemia, unspecified: Secondary | ICD-10-CM | POA: Diagnosis not present

## 2017-09-15 DIAGNOSIS — K219 Gastro-esophageal reflux disease without esophagitis: Secondary | ICD-10-CM | POA: Diagnosis not present

## 2017-09-15 DIAGNOSIS — Z9181 History of falling: Secondary | ICD-10-CM | POA: Diagnosis not present

## 2017-09-15 DIAGNOSIS — S82899D Other fracture of unspecified lower leg, subsequent encounter for closed fracture with routine healing: Secondary | ICD-10-CM | POA: Diagnosis not present

## 2017-09-15 DIAGNOSIS — S42202D Unspecified fracture of upper end of left humerus, subsequent encounter for fracture with routine healing: Secondary | ICD-10-CM | POA: Diagnosis not present

## 2017-09-15 DIAGNOSIS — M8580 Other specified disorders of bone density and structure, unspecified site: Secondary | ICD-10-CM | POA: Diagnosis not present

## 2017-09-15 DIAGNOSIS — M810 Age-related osteoporosis without current pathological fracture: Secondary | ICD-10-CM | POA: Diagnosis not present

## 2017-09-15 DIAGNOSIS — I10 Essential (primary) hypertension: Secondary | ICD-10-CM | POA: Diagnosis not present

## 2017-09-15 DIAGNOSIS — I480 Paroxysmal atrial fibrillation: Secondary | ICD-10-CM | POA: Diagnosis not present

## 2017-09-15 DIAGNOSIS — Z7901 Long term (current) use of anticoagulants: Secondary | ICD-10-CM | POA: Diagnosis not present

## 2017-09-15 DIAGNOSIS — M1991 Primary osteoarthritis, unspecified site: Secondary | ICD-10-CM | POA: Diagnosis not present

## 2017-09-15 DIAGNOSIS — Z79891 Long term (current) use of opiate analgesic: Secondary | ICD-10-CM | POA: Diagnosis not present

## 2017-09-21 DIAGNOSIS — Z9181 History of falling: Secondary | ICD-10-CM | POA: Diagnosis not present

## 2017-09-21 DIAGNOSIS — E785 Hyperlipidemia, unspecified: Secondary | ICD-10-CM | POA: Diagnosis not present

## 2017-09-21 DIAGNOSIS — I480 Paroxysmal atrial fibrillation: Secondary | ICD-10-CM | POA: Diagnosis not present

## 2017-09-21 DIAGNOSIS — Z7901 Long term (current) use of anticoagulants: Secondary | ICD-10-CM | POA: Diagnosis not present

## 2017-09-21 DIAGNOSIS — F418 Other specified anxiety disorders: Secondary | ICD-10-CM | POA: Diagnosis not present

## 2017-09-21 DIAGNOSIS — M8580 Other specified disorders of bone density and structure, unspecified site: Secondary | ICD-10-CM | POA: Diagnosis not present

## 2017-09-21 DIAGNOSIS — K219 Gastro-esophageal reflux disease without esophagitis: Secondary | ICD-10-CM | POA: Diagnosis not present

## 2017-09-21 DIAGNOSIS — S42202D Unspecified fracture of upper end of left humerus, subsequent encounter for fracture with routine healing: Secondary | ICD-10-CM | POA: Diagnosis not present

## 2017-09-21 DIAGNOSIS — M1991 Primary osteoarthritis, unspecified site: Secondary | ICD-10-CM | POA: Diagnosis not present

## 2017-09-21 DIAGNOSIS — M810 Age-related osteoporosis without current pathological fracture: Secondary | ICD-10-CM | POA: Diagnosis not present

## 2017-09-21 DIAGNOSIS — Z79891 Long term (current) use of opiate analgesic: Secondary | ICD-10-CM | POA: Diagnosis not present

## 2017-09-21 DIAGNOSIS — I10 Essential (primary) hypertension: Secondary | ICD-10-CM | POA: Diagnosis not present

## 2017-09-21 DIAGNOSIS — S82899D Other fracture of unspecified lower leg, subsequent encounter for closed fracture with routine healing: Secondary | ICD-10-CM | POA: Diagnosis not present

## 2017-09-22 ENCOUNTER — Other Ambulatory Visit (INDEPENDENT_AMBULATORY_CARE_PROVIDER_SITE_OTHER): Payer: Self-pay | Admitting: Family

## 2017-09-22 ENCOUNTER — Telehealth (INDEPENDENT_AMBULATORY_CARE_PROVIDER_SITE_OTHER): Payer: Self-pay | Admitting: Orthopedic Surgery

## 2017-09-22 MED ORDER — OXYCODONE HCL 5 MG PO TABS: 5.0000 mg | ORAL_TABLET | Freq: Four times a day (QID) | ORAL | 0 refills | Status: DC | PRN

## 2017-09-22 NOTE — Telephone Encounter (Signed)
Called Patient Rx ready for pick up at the front desk. Patient aware.Alyssa Martin

## 2017-09-22 NOTE — Telephone Encounter (Signed)
Can you advise? 

## 2017-09-22 NOTE — Telephone Encounter (Signed)
Patient called needing Rx refilled (Oxycodone) The number to contact patient is [phone]

## 2017-09-26 DIAGNOSIS — M8580 Other specified disorders of bone density and structure, unspecified site: Secondary | ICD-10-CM | POA: Diagnosis not present

## 2017-09-26 DIAGNOSIS — E785 Hyperlipidemia, unspecified: Secondary | ICD-10-CM | POA: Diagnosis not present

## 2017-09-26 DIAGNOSIS — K219 Gastro-esophageal reflux disease without esophagitis: Secondary | ICD-10-CM | POA: Diagnosis not present

## 2017-09-26 DIAGNOSIS — S82899D Other fracture of unspecified lower leg, subsequent encounter for closed fracture with routine healing: Secondary | ICD-10-CM | POA: Diagnosis not present

## 2017-09-26 DIAGNOSIS — Z9181 History of falling: Secondary | ICD-10-CM | POA: Diagnosis not present

## 2017-09-26 DIAGNOSIS — S42202D Unspecified fracture of upper end of left humerus, subsequent encounter for fracture with routine healing: Secondary | ICD-10-CM | POA: Diagnosis not present

## 2017-09-26 DIAGNOSIS — Z79891 Long term (current) use of opiate analgesic: Secondary | ICD-10-CM | POA: Diagnosis not present

## 2017-09-26 DIAGNOSIS — M810 Age-related osteoporosis without current pathological fracture: Secondary | ICD-10-CM | POA: Diagnosis not present

## 2017-09-26 DIAGNOSIS — I480 Paroxysmal atrial fibrillation: Secondary | ICD-10-CM | POA: Diagnosis not present

## 2017-09-26 DIAGNOSIS — Z7901 Long term (current) use of anticoagulants: Secondary | ICD-10-CM | POA: Diagnosis not present

## 2017-09-26 DIAGNOSIS — I10 Essential (primary) hypertension: Secondary | ICD-10-CM | POA: Diagnosis not present

## 2017-09-26 DIAGNOSIS — F418 Other specified anxiety disorders: Secondary | ICD-10-CM | POA: Diagnosis not present

## 2017-09-26 DIAGNOSIS — M1991 Primary osteoarthritis, unspecified site: Secondary | ICD-10-CM | POA: Diagnosis not present

## 2017-09-28 ENCOUNTER — Ambulatory Visit (INDEPENDENT_AMBULATORY_CARE_PROVIDER_SITE_OTHER): Payer: PPO | Admitting: Orthopedic Surgery

## 2017-09-28 ENCOUNTER — Ambulatory Visit (INDEPENDENT_AMBULATORY_CARE_PROVIDER_SITE_OTHER): Payer: PPO

## 2017-09-28 DIAGNOSIS — S4292XD Fracture of left shoulder girdle, part unspecified, subsequent encounter for fracture with routine healing: Secondary | ICD-10-CM

## 2017-09-29 ENCOUNTER — Telehealth (INDEPENDENT_AMBULATORY_CARE_PROVIDER_SITE_OTHER): Payer: Self-pay | Admitting: Orthopedic Surgery

## 2017-09-29 NOTE — Telephone Encounter (Signed)
IC advised ok.  

## 2017-09-29 NOTE — Telephone Encounter (Signed)
Pt called and would like to do her therapy at [facility] if possible. Pt wanted to know if this was an option.

## 2017-09-30 ENCOUNTER — Encounter (INDEPENDENT_AMBULATORY_CARE_PROVIDER_SITE_OTHER): Payer: Self-pay | Admitting: Orthopedic Surgery

## 2017-09-30 NOTE — Progress Notes (Signed)
   Post-Op Visit Note   Patient: Alyssa Martin           Date of Birth: [birth year]           MRN: [id number] Visit Date: 09/28/2017 PCP: Marton Redwood, MD   Assessment & Plan:  Chief Complaint:  Chief Complaint  Patient presents with  . Left Shoulder - Follow-up   Visit Diagnoses:  1. Closed fracture of left shoulder with routine healing, subsequent encounter     Plan: Alyssa Martin is a 72 year old patient who is now 6 weeks out left proximal humerus fracture.  She is doing better.  She is using the CPM twice a day.  Taking oxycodone for pain.  On examination she is predictably stiff with about 30 degrees of external rotation at 15 degrees of abduction and forward flexion and abduction both below 90 at this point.  I would like for her to continue the CPM machine for 4 more weeks increasing it as she can tolerate it.  We will change from oxycodone to [place] back in 6 weeks for clinical recheck on range of motion.  Radiographs of the shoulder look good today.  Follow-Up Instructions: Return in about 6 weeks (around 11/09/2017).   Orders:  Orders Placed This Encounter  Procedures  . XR Shoulder Left   No orders of the defined types were placed in this encounter.   Imaging: No results found.  PMFS History: Patient Active Problem List   Diagnosis Date Noted  . Shoulder fracture, left 08/18/2017  . Paroxysmal A-fib ([facility]) 09/08/2012   Past Medical History:  Diagnosis Date  . Arthritis   . Closed fracture of left proximal humerus   . Closed right ankle fracture   . Depression with anxiety   . Elevated BP   . GERD (gastroesophageal reflux disease)   . Hyperlipidemia   . Hypertension   . Impaired fasting glucose   . Osteopenia   . Osteoporosis   . Paroxysmal atrial fibrillation ([facility])   . Vitamin D deficiency   . Wears glasses     Family History  Problem Relation Age of Onset  . Hypertension Mother   . Osteoarthritis Mother   . Bone cancer Father   . Osteopenia  Father   . Lung cancer Brother   . Lung cancer Paternal Grandmother   . Leukemia Paternal Grandfather   . Bone cancer Brother     Past Surgical History:  Procedure Laterality Date  . COLONOSCOPY  05/10/2011  . DIAGNOSTIC MAMMOGRAM  10 /09/2010  . ORIF HUMERUS FRACTURE Left 08/18/2017   Procedure: OPEN REDUCTION INTERNAL FIXATION (ORIF) LEFT PROXIMAL HUMERUS FRACTURE;  Surgeon: Meredith Pel, MD;  Location: [facility];  Service: Orthopedics;  Laterality: Left;  . TIBIA FRACTURE SURGERY  04/2006   Left--From falling down the stairs  . TIBIA FRACTURE SURGERY     In her 18's   Social History   Occupational History  . Occupation: teacher-retired  Tobacco Use  . Smoking status: Never Smoker  . Smokeless tobacco: Never Used  Substance and Sexual Activity  . Alcohol use: No    Alcohol/week: 0.0 oz  . Drug use: No  . Sexual activity: Not on file

## 2017-10-03 DIAGNOSIS — E785 Hyperlipidemia, unspecified: Secondary | ICD-10-CM | POA: Diagnosis not present

## 2017-10-03 DIAGNOSIS — F418 Other specified anxiety disorders: Secondary | ICD-10-CM | POA: Diagnosis not present

## 2017-10-03 DIAGNOSIS — Z7901 Long term (current) use of anticoagulants: Secondary | ICD-10-CM | POA: Diagnosis not present

## 2017-10-03 DIAGNOSIS — I480 Paroxysmal atrial fibrillation: Secondary | ICD-10-CM | POA: Diagnosis not present

## 2017-10-03 DIAGNOSIS — S42202D Unspecified fracture of upper end of left humerus, subsequent encounter for fracture with routine healing: Secondary | ICD-10-CM | POA: Diagnosis not present

## 2017-10-03 DIAGNOSIS — M810 Age-related osteoporosis without current pathological fracture: Secondary | ICD-10-CM | POA: Diagnosis not present

## 2017-10-03 DIAGNOSIS — I10 Essential (primary) hypertension: Secondary | ICD-10-CM | POA: Diagnosis not present

## 2017-10-03 DIAGNOSIS — Z79891 Long term (current) use of opiate analgesic: Secondary | ICD-10-CM | POA: Diagnosis not present

## 2017-10-03 DIAGNOSIS — S82899D Other fracture of unspecified lower leg, subsequent encounter for closed fracture with routine healing: Secondary | ICD-10-CM | POA: Diagnosis not present

## 2017-10-03 DIAGNOSIS — K219 Gastro-esophageal reflux disease without esophagitis: Secondary | ICD-10-CM | POA: Diagnosis not present

## 2017-10-03 DIAGNOSIS — Z9181 History of falling: Secondary | ICD-10-CM | POA: Diagnosis not present

## 2017-10-03 DIAGNOSIS — M8580 Other specified disorders of bone density and structure, unspecified site: Secondary | ICD-10-CM | POA: Diagnosis not present

## 2017-10-03 DIAGNOSIS — M1991 Primary osteoarthritis, unspecified site: Secondary | ICD-10-CM | POA: Diagnosis not present

## 2017-10-10 DIAGNOSIS — S82899D Other fracture of unspecified lower leg, subsequent encounter for closed fracture with routine healing: Secondary | ICD-10-CM | POA: Diagnosis not present

## 2017-10-10 DIAGNOSIS — E785 Hyperlipidemia, unspecified: Secondary | ICD-10-CM | POA: Diagnosis not present

## 2017-10-10 DIAGNOSIS — Z9181 History of falling: Secondary | ICD-10-CM | POA: Diagnosis not present

## 2017-10-10 DIAGNOSIS — I10 Essential (primary) hypertension: Secondary | ICD-10-CM | POA: Diagnosis not present

## 2017-10-10 DIAGNOSIS — K219 Gastro-esophageal reflux disease without esophagitis: Secondary | ICD-10-CM | POA: Diagnosis not present

## 2017-10-10 DIAGNOSIS — Z79891 Long term (current) use of opiate analgesic: Secondary | ICD-10-CM | POA: Diagnosis not present

## 2017-10-10 DIAGNOSIS — H6590 Unspecified nonsuppurative otitis media, unspecified ear: Secondary | ICD-10-CM | POA: Diagnosis not present

## 2017-10-10 DIAGNOSIS — I480 Paroxysmal atrial fibrillation: Secondary | ICD-10-CM | POA: Diagnosis not present

## 2017-10-10 DIAGNOSIS — M8580 Other specified disorders of bone density and structure, unspecified site: Secondary | ICD-10-CM | POA: Diagnosis not present

## 2017-10-10 DIAGNOSIS — M810 Age-related osteoporosis without current pathological fracture: Secondary | ICD-10-CM | POA: Diagnosis not present

## 2017-10-10 DIAGNOSIS — S42202D Unspecified fracture of upper end of left humerus, subsequent encounter for fracture with routine healing: Secondary | ICD-10-CM | POA: Diagnosis not present

## 2017-10-10 DIAGNOSIS — Z6822 Body mass index (BMI) 22.0-22.9, adult: Secondary | ICD-10-CM | POA: Diagnosis not present

## 2017-10-10 DIAGNOSIS — F418 Other specified anxiety disorders: Secondary | ICD-10-CM | POA: Diagnosis not present

## 2017-10-10 DIAGNOSIS — Z7901 Long term (current) use of anticoagulants: Secondary | ICD-10-CM | POA: Diagnosis not present

## 2017-10-10 DIAGNOSIS — M1991 Primary osteoarthritis, unspecified site: Secondary | ICD-10-CM | POA: Diagnosis not present

## 2017-10-11 ENCOUNTER — Telehealth (INDEPENDENT_AMBULATORY_CARE_PROVIDER_SITE_OTHER): Payer: Self-pay

## 2017-10-11 NOTE — Telephone Encounter (Signed)
Patient would like a call back concerning outpatient therapy.  Stated that she will have HHPT 3 or 4 more times.  Cb# is [phone].  Please advise.  Thank you.

## 2017-10-12 NOTE — Telephone Encounter (Signed)
Rx done pls call thx

## 2017-10-12 NOTE — Telephone Encounter (Signed)
Tried calling patient to discuss. No answer. LMVM for her. Do you want her to start outpt P.T. After HHPT is complete? If so, can you write order for her?

## 2017-10-12 NOTE — Telephone Encounter (Signed)
I have put outpt rx for PT up front for patient to pick up to take to facility of her choice to proceed with therapy. I have tried calling and LM for her.

## 2017-10-13 ENCOUNTER — Telehealth (INDEPENDENT_AMBULATORY_CARE_PROVIDER_SITE_OTHER): Payer: Self-pay

## 2017-10-13 NOTE — Telephone Encounter (Signed)
Talked with patient and advised her of message per Rogelia Boga.

## 2017-10-18 ENCOUNTER — Telehealth (INDEPENDENT_AMBULATORY_CARE_PROVIDER_SITE_OTHER): Payer: Self-pay | Admitting: Orthopedic Surgery

## 2017-10-18 DIAGNOSIS — Z7901 Long term (current) use of anticoagulants: Secondary | ICD-10-CM | POA: Diagnosis not present

## 2017-10-18 DIAGNOSIS — K219 Gastro-esophageal reflux disease without esophagitis: Secondary | ICD-10-CM | POA: Diagnosis not present

## 2017-10-18 DIAGNOSIS — I10 Essential (primary) hypertension: Secondary | ICD-10-CM | POA: Diagnosis not present

## 2017-10-18 DIAGNOSIS — Z9181 History of falling: Secondary | ICD-10-CM | POA: Diagnosis not present

## 2017-10-18 DIAGNOSIS — F418 Other specified anxiety disorders: Secondary | ICD-10-CM | POA: Diagnosis not present

## 2017-10-18 DIAGNOSIS — S42202D Unspecified fracture of upper end of left humerus, subsequent encounter for fracture with routine healing: Secondary | ICD-10-CM | POA: Diagnosis not present

## 2017-10-18 DIAGNOSIS — I480 Paroxysmal atrial fibrillation: Secondary | ICD-10-CM | POA: Diagnosis not present

## 2017-10-18 DIAGNOSIS — S82899D Other fracture of unspecified lower leg, subsequent encounter for closed fracture with routine healing: Secondary | ICD-10-CM | POA: Diagnosis not present

## 2017-10-18 DIAGNOSIS — Z79891 Long term (current) use of opiate analgesic: Secondary | ICD-10-CM | POA: Diagnosis not present

## 2017-10-18 DIAGNOSIS — M1991 Primary osteoarthritis, unspecified site: Secondary | ICD-10-CM | POA: Diagnosis not present

## 2017-10-18 DIAGNOSIS — M810 Age-related osteoporosis without current pathological fracture: Secondary | ICD-10-CM | POA: Diagnosis not present

## 2017-10-18 DIAGNOSIS — M8580 Other specified disorders of bone density and structure, unspecified site: Secondary | ICD-10-CM | POA: Diagnosis not present

## 2017-10-18 DIAGNOSIS — E785 Hyperlipidemia, unspecified: Secondary | ICD-10-CM | POA: Diagnosis not present

## 2017-10-18 NOTE — Telephone Encounter (Signed)
IC sw patient. She states that [facility] would not schedule her without additional information. I faxed over OV notes, op notes, and demographics to them [phone]

## 2017-10-18 NOTE — Telephone Encounter (Signed)
Patient called advised she would like to go to the outpatient (PT) at [facility]. Patient asked for a call back as soon as possible. Patient advised [facility] wouldn't just take the Rx because it did not tell them anything. The number to contact patient is [phone] or [phone] cell

## 2017-10-27 DIAGNOSIS — M25512 Pain in left shoulder: Secondary | ICD-10-CM | POA: Diagnosis not present

## 2017-10-27 DIAGNOSIS — S42202D Unspecified fracture of upper end of left humerus, subsequent encounter for fracture with routine healing: Secondary | ICD-10-CM | POA: Diagnosis not present

## 2017-10-27 DIAGNOSIS — M25612 Stiffness of left shoulder, not elsewhere classified: Secondary | ICD-10-CM | POA: Diagnosis not present

## 2017-10-31 DIAGNOSIS — M25512 Pain in left shoulder: Secondary | ICD-10-CM | POA: Diagnosis not present

## 2017-10-31 DIAGNOSIS — S42202D Unspecified fracture of upper end of left humerus, subsequent encounter for fracture with routine healing: Secondary | ICD-10-CM | POA: Diagnosis not present

## 2017-10-31 DIAGNOSIS — M25612 Stiffness of left shoulder, not elsewhere classified: Secondary | ICD-10-CM | POA: Diagnosis not present

## 2017-11-01 ENCOUNTER — Other Ambulatory Visit: Payer: Self-pay | Admitting: Cardiology

## 2017-11-02 ENCOUNTER — Ambulatory Visit (INDEPENDENT_AMBULATORY_CARE_PROVIDER_SITE_OTHER): Payer: PPO | Admitting: Orthopedic Surgery

## 2017-11-02 ENCOUNTER — Encounter (INDEPENDENT_AMBULATORY_CARE_PROVIDER_SITE_OTHER): Payer: Self-pay | Admitting: Orthopedic Surgery

## 2017-11-02 DIAGNOSIS — S42202D Unspecified fracture of upper end of left humerus, subsequent encounter for fracture with routine healing: Secondary | ICD-10-CM | POA: Diagnosis not present

## 2017-11-02 DIAGNOSIS — S4292XD Fracture of left shoulder girdle, part unspecified, subsequent encounter for fracture with routine healing: Secondary | ICD-10-CM

## 2017-11-02 DIAGNOSIS — M25512 Pain in left shoulder: Secondary | ICD-10-CM | POA: Diagnosis not present

## 2017-11-02 DIAGNOSIS — M25612 Stiffness of left shoulder, not elsewhere classified: Secondary | ICD-10-CM | POA: Diagnosis not present

## 2017-11-03 NOTE — Telephone Encounter (Signed)
Rx(s) sent to pharmacy electronically.  

## 2017-11-04 DIAGNOSIS — M25512 Pain in left shoulder: Secondary | ICD-10-CM | POA: Diagnosis not present

## 2017-11-04 DIAGNOSIS — M25612 Stiffness of left shoulder, not elsewhere classified: Secondary | ICD-10-CM | POA: Diagnosis not present

## 2017-11-04 DIAGNOSIS — S42202D Unspecified fracture of upper end of left humerus, subsequent encounter for fracture with routine healing: Secondary | ICD-10-CM | POA: Diagnosis not present

## 2017-11-05 ENCOUNTER — Encounter (INDEPENDENT_AMBULATORY_CARE_PROVIDER_SITE_OTHER): Payer: Self-pay | Admitting: Orthopedic Surgery

## 2017-11-05 NOTE — Progress Notes (Signed)
   Post-Op Visit Note   Patient: Alyssa Martin           Date of Birth: November 30, 1945           MRN: [id number] Visit Date: 11/02/2017 PCP: Marton Redwood, MD   Assessment & Plan:  Chief Complaint:  Chief Complaint  Patient presents with  . Left Shoulder - Follow-up   Visit Diagnoses:  1. Closed fracture of left shoulder with routine healing, subsequent encounter     Plan: Alyssa Martin is a patient who is now 2-1/2 months out left proximal humerus fracture fixation.  Therapy 3 times a week.  Not taking any medication during the day.  On exam she has improving range of motion.  Plan is to continue therapy until the end of the month and progress to home exercise program.  See her back in 6 weeks.  I encouraged her to really try to stretch the shoulder out.  Progress but she needs to continue to work in order to loosen the shoulder up.  Follow-up in 6 weeks for recheck on range of motion  Follow-Up Instructions: Return in about 6 weeks (around 12/14/2017).   Orders:  No orders of the defined types were placed in this encounter.  No orders of the defined types were placed in this encounter.   Imaging: No results found.  PMFS History: Patient Active Problem List   Diagnosis Date Noted  . Shoulder fracture, left 08/18/2017  . Paroxysmal A-fib ([facility]) 09/08/2012   Past Medical History:  Diagnosis Date  . Arthritis   . Closed fracture of left proximal humerus   . Closed right ankle fracture   . Depression with anxiety   . Elevated BP   . GERD (gastroesophageal reflux disease)   . Hyperlipidemia   . Hypertension   . Impaired fasting glucose   . Osteopenia   . Osteoporosis   . Paroxysmal atrial fibrillation (H[facility])   . Vitamin D deficiency   . Wears glasses     Family History  Problem Relation Age of Onset  . Hypertension Mother   . Osteoarthritis Mother   . Bone cancer Father   . Osteopenia Father   . Lung cancer Brother   . Lung cancer Paternal Grandmother   . Leukemia  Paternal Grandfather   . Bone cancer Brother     Past Surgical History:  Procedure Laterality Date  . COLONOSCOPY  05/10/2011  . DIAGNOSTIC MAMMOGRAM  10 /09/2010  . ORIF HUMERUS FRACTURE Left 08/18/2017   Procedure: OPEN REDUCTION INTERNAL FIXATION (ORIF) LEFT PROXIMAL HUMERUS FRACTURE;  Surgeon: Meredith Pel, MD;  Location: [facility];  Service: Orthopedics;  Laterality: Left;  . TIBIA FRACTURE SURGERY  04/2006   Left--From falling down the stairs  . TIBIA FRACTURE SURGERY     In her 28's   Social History   Occupational History  . Occupation: teacher-retired  Tobacco Use  . Smoking status: Never Smoker  . Smokeless tobacco: Never Used  Substance and Sexual Activity  . Alcohol use: No    Alcohol/week: 0.0 oz  . Drug use: No  . Sexual activity: Not on file

## 2017-11-07 DIAGNOSIS — M25612 Stiffness of left shoulder, not elsewhere classified: Secondary | ICD-10-CM | POA: Diagnosis not present

## 2017-11-07 DIAGNOSIS — S42202D Unspecified fracture of upper end of left humerus, subsequent encounter for fracture with routine healing: Secondary | ICD-10-CM | POA: Diagnosis not present

## 2017-11-07 DIAGNOSIS — M25512 Pain in left shoulder: Secondary | ICD-10-CM | POA: Diagnosis not present

## 2017-11-09 DIAGNOSIS — S42202D Unspecified fracture of upper end of left humerus, subsequent encounter for fracture with routine healing: Secondary | ICD-10-CM | POA: Diagnosis not present

## 2017-11-09 DIAGNOSIS — M25512 Pain in left shoulder: Secondary | ICD-10-CM | POA: Diagnosis not present

## 2017-11-09 DIAGNOSIS — M25612 Stiffness of left shoulder, not elsewhere classified: Secondary | ICD-10-CM | POA: Diagnosis not present

## 2017-11-10 DIAGNOSIS — R300 Dysuria: Secondary | ICD-10-CM | POA: Diagnosis not present

## 2017-11-10 DIAGNOSIS — N390 Urinary tract infection, site not specified: Secondary | ICD-10-CM | POA: Diagnosis not present

## 2017-11-10 DIAGNOSIS — R8290 Unspecified abnormal findings in urine: Secondary | ICD-10-CM | POA: Diagnosis not present

## 2017-11-11 DIAGNOSIS — S42202D Unspecified fracture of upper end of left humerus, subsequent encounter for fracture with routine healing: Secondary | ICD-10-CM | POA: Diagnosis not present

## 2017-11-11 DIAGNOSIS — M25612 Stiffness of left shoulder, not elsewhere classified: Secondary | ICD-10-CM | POA: Diagnosis not present

## 2017-11-11 DIAGNOSIS — M25512 Pain in left shoulder: Secondary | ICD-10-CM | POA: Diagnosis not present

## 2017-11-14 DIAGNOSIS — M25612 Stiffness of left shoulder, not elsewhere classified: Secondary | ICD-10-CM | POA: Diagnosis not present

## 2017-11-14 DIAGNOSIS — M25512 Pain in left shoulder: Secondary | ICD-10-CM | POA: Diagnosis not present

## 2017-11-14 DIAGNOSIS — S42202D Unspecified fracture of upper end of left humerus, subsequent encounter for fracture with routine healing: Secondary | ICD-10-CM | POA: Diagnosis not present

## 2017-11-15 ENCOUNTER — Other Ambulatory Visit: Payer: Self-pay | Admitting: Pharmacist

## 2017-11-15 MED ORDER — RIVAROXABAN 20 MG PO TABS: 20.0000 mg | ORAL_TABLET | Freq: Every day | ORAL | 0 refills | Status: DC

## 2017-11-16 DIAGNOSIS — S42202D Unspecified fracture of upper end of left humerus, subsequent encounter for fracture with routine healing: Secondary | ICD-10-CM | POA: Diagnosis not present

## 2017-11-16 DIAGNOSIS — M25512 Pain in left shoulder: Secondary | ICD-10-CM | POA: Diagnosis not present

## 2017-11-16 DIAGNOSIS — M25612 Stiffness of left shoulder, not elsewhere classified: Secondary | ICD-10-CM | POA: Diagnosis not present

## 2017-11-18 DIAGNOSIS — M25612 Stiffness of left shoulder, not elsewhere classified: Secondary | ICD-10-CM | POA: Diagnosis not present

## 2017-11-18 DIAGNOSIS — M25512 Pain in left shoulder: Secondary | ICD-10-CM | POA: Diagnosis not present

## 2017-11-18 DIAGNOSIS — S42202D Unspecified fracture of upper end of left humerus, subsequent encounter for fracture with routine healing: Secondary | ICD-10-CM | POA: Diagnosis not present

## 2017-11-21 DIAGNOSIS — M25612 Stiffness of left shoulder, not elsewhere classified: Secondary | ICD-10-CM | POA: Diagnosis not present

## 2017-11-21 DIAGNOSIS — M25512 Pain in left shoulder: Secondary | ICD-10-CM | POA: Diagnosis not present

## 2017-11-21 DIAGNOSIS — S42202D Unspecified fracture of upper end of left humerus, subsequent encounter for fracture with routine healing: Secondary | ICD-10-CM | POA: Diagnosis not present

## 2017-11-23 DIAGNOSIS — S42202D Unspecified fracture of upper end of left humerus, subsequent encounter for fracture with routine healing: Secondary | ICD-10-CM | POA: Diagnosis not present

## 2017-11-23 DIAGNOSIS — M25612 Stiffness of left shoulder, not elsewhere classified: Secondary | ICD-10-CM | POA: Diagnosis not present

## 2017-11-23 DIAGNOSIS — M25512 Pain in left shoulder: Secondary | ICD-10-CM | POA: Diagnosis not present

## 2017-11-25 DIAGNOSIS — M25512 Pain in left shoulder: Secondary | ICD-10-CM | POA: Diagnosis not present

## 2017-11-25 DIAGNOSIS — M25612 Stiffness of left shoulder, not elsewhere classified: Secondary | ICD-10-CM | POA: Diagnosis not present

## 2017-11-25 DIAGNOSIS — S42202D Unspecified fracture of upper end of left humerus, subsequent encounter for fracture with routine healing: Secondary | ICD-10-CM | POA: Diagnosis not present

## 2017-11-28 DIAGNOSIS — S42202D Unspecified fracture of upper end of left humerus, subsequent encounter for fracture with routine healing: Secondary | ICD-10-CM | POA: Diagnosis not present

## 2017-11-28 DIAGNOSIS — M25512 Pain in left shoulder: Secondary | ICD-10-CM | POA: Diagnosis not present

## 2017-11-28 DIAGNOSIS — M25612 Stiffness of left shoulder, not elsewhere classified: Secondary | ICD-10-CM | POA: Diagnosis not present

## 2017-11-30 DIAGNOSIS — M25612 Stiffness of left shoulder, not elsewhere classified: Secondary | ICD-10-CM | POA: Diagnosis not present

## 2017-11-30 DIAGNOSIS — S42202D Unspecified fracture of upper end of left humerus, subsequent encounter for fracture with routine healing: Secondary | ICD-10-CM | POA: Diagnosis not present

## 2017-11-30 DIAGNOSIS — M25512 Pain in left shoulder: Secondary | ICD-10-CM | POA: Diagnosis not present

## 2017-12-02 ENCOUNTER — Encounter: Payer: Self-pay | Admitting: Cardiology

## 2017-12-02 DIAGNOSIS — M25612 Stiffness of left shoulder, not elsewhere classified: Secondary | ICD-10-CM | POA: Diagnosis not present

## 2017-12-02 DIAGNOSIS — S42202D Unspecified fracture of upper end of left humerus, subsequent encounter for fracture with routine healing: Secondary | ICD-10-CM | POA: Diagnosis not present

## 2017-12-02 DIAGNOSIS — M25512 Pain in left shoulder: Secondary | ICD-10-CM | POA: Diagnosis not present

## 2017-12-05 DIAGNOSIS — S42202D Unspecified fracture of upper end of left humerus, subsequent encounter for fracture with routine healing: Secondary | ICD-10-CM | POA: Diagnosis not present

## 2017-12-05 DIAGNOSIS — M25512 Pain in left shoulder: Secondary | ICD-10-CM | POA: Diagnosis not present

## 2017-12-05 DIAGNOSIS — M25612 Stiffness of left shoulder, not elsewhere classified: Secondary | ICD-10-CM | POA: Diagnosis not present

## 2017-12-07 DIAGNOSIS — S42202D Unspecified fracture of upper end of left humerus, subsequent encounter for fracture with routine healing: Secondary | ICD-10-CM | POA: Diagnosis not present

## 2017-12-07 DIAGNOSIS — M25512 Pain in left shoulder: Secondary | ICD-10-CM | POA: Diagnosis not present

## 2017-12-07 DIAGNOSIS — M25612 Stiffness of left shoulder, not elsewhere classified: Secondary | ICD-10-CM | POA: Diagnosis not present

## 2017-12-09 DIAGNOSIS — M25612 Stiffness of left shoulder, not elsewhere classified: Secondary | ICD-10-CM | POA: Diagnosis not present

## 2017-12-09 DIAGNOSIS — M25512 Pain in left shoulder: Secondary | ICD-10-CM | POA: Diagnosis not present

## 2017-12-09 DIAGNOSIS — S42202D Unspecified fracture of upper end of left humerus, subsequent encounter for fracture with routine healing: Secondary | ICD-10-CM | POA: Diagnosis not present

## 2017-12-10 NOTE — Progress Notes (Signed)
Alyssa Martin Date of Birth: Feb 17, 1946 Medical Record #[id number]  History of Present Illness: [name] is seen for follow up Afib. She has a history of paroxysmal atrial fibrillation dating back to 2013.  She has been on cardizem and Xarelto.  She reports no Afib episodes for the last year.  She is exercising regularly- walking helps with the grief of losing her husband to leukemia. In November she fell turning her ankle and fracturing her left proximal humerus. She had fixation of this without complication. She reports her PT is going well. No dizziness, tachycardia, chest pain or SOB.   Current Outpatient Medications on File Prior to Visit  Medication Sig Dispense Refill  . Calcium Citrate (CITRACAL PO) Take 630 mg by mouth 2 (two) times daily.     . ergocalciferol (VITAMIN D2) 50000 UNITS capsule Take 50,000 Units by mouth every 14 (fourteen) days.    Marland Kitchen HYDROcodone-acetaminophen (NORCO/VICODIN) 5-325 MG tablet TK 1 TO 2 TS PO Q 6 H PRF MODERATE PAIN  0  . loratadine (CLARITIN) 10 MG tablet Take 10 mg by mouth daily as needed for allergies.     . methocarbamol (ROBAXIN) 500 MG tablet Take 1 tablet (500 mg total) by mouth every 6 (six) hours as needed for muscle spasms. 30 tablet 0  . mometasone (NASONEX) 50 MCG/ACT nasal spray Place 2 sprays into the nose daily as needed (allergies).     . Multiple Vitamin (MULTIVITAMIN) tablet Take 1 tablet by mouth daily.    Marland Kitchen PREMARIN vaginal cream APP 1 GRAM VAGINALLY 2 TIMES A WK  11  . ranitidine (ZANTAC) 150 MG tablet Take 150 mg by mouth 2 (two) times daily as needed for heartburn.    . simvastatin (ZOCOR) 20 MG tablet Take 20 mg by mouth every evening.    . vitamin C (ASCORBIC ACID) 500 MG tablet Take 500 mg by mouth daily.    . zoledronic acid (RECLAST) 5 MG/100ML SOLN injection Inject 5 mg into the vein once. Yearly     No current facility-administered medications on file prior to visit.     Allergies  Allergen Reactions  .  Penicillins     UNSPECIFIED REACTION   . Ativan [Lorazepam] Nausea Only    Past Medical History:  Diagnosis Date  . Arthritis   . Closed fracture of left proximal humerus   . Closed right ankle fracture   . Depression with anxiety   . Elevated BP   . GERD (gastroesophageal reflux disease)   . Hyperlipidemia   . Hypertension   . Impaired fasting glucose   . Osteopenia   . Osteoporosis   . Paroxysmal atrial fibrillation ([facility])   . Vitamin D deficiency   . Wears glasses     Past Surgical History:  Procedure Laterality Date  . COLONOSCOPY  05/10/2011  . DIAGNOSTIC MAMMOGRAM  10 /09/2010  . ORIF HUMERUS FRACTURE Left 08/18/2017   Procedure: OPEN REDUCTION INTERNAL FIXATION (ORIF) LEFT PROXIMAL HUMERUS FRACTURE;  Surgeon: Meredith Pel, MD;  Location: [facility];  Service: Orthopedics;  Laterality: Left;  . TIBIA FRACTURE SURGERY  04/2006   Left--From falling down the stairs  . TIBIA FRACTURE SURGERY     In her 7's    Social History   Tobacco Use  Smoking Status Never Smoker  Smokeless Tobacco Never Used    Social History   Substance and Sexual Activity  Alcohol Use No  . Alcohol/week: 0.0 oz    Family History  Problem  Relation Age of Onset  . Hypertension Mother   . Osteoarthritis Mother   . Bone cancer Father   . Osteopenia Father   . Lung cancer Brother   . Lung cancer Paternal Grandmother   . Leukemia Paternal Grandfather   . Bone cancer Brother     Review of Systems: As noted in HPI. All other systems were reviewed and are negative.  Physical Exam: BP 114/66   Pulse 68   Ht 4\' 11"  (1.499 m)   Wt 110 lb (49.9 kg)   BMI 22.22 kg/m  GENERAL:  Well appearing HEENT:  PERRL, EOMI, sclera are clear. Oropharynx is clear. NECK:  No jugular venous distention, carotid upstroke brisk and symmetric, no bruits, no thyromegaly or adenopathy LUNGS:  Clear to auscultation bilaterally CHEST:  Unremarkable HEART:  RRR,  PMI not displaced or sustained,S1 and S2  within normal limits, no S3, no S4: no clicks, no rubs, no murmurs ABD:  Soft, nontender. BS +, no masses or bruits. No hepatomegaly, no splenomegaly EXT:  2 + pulses throughout, no edema, no cyanosis no clubbing SKIN:  Warm and dry.  No rashes NEURO:  Alert and oriented x 3. Cranial nerves II through XII intact. PSYCH:  Cognitively intact    LABORATORY DATA: Lab Results  Component Value Date   WBC 8.1 08/12/2017   HGB 13.2 08/12/2017   HCT 40.2 08/12/2017   PLT 249 08/12/2017   GLUCOSE 117 (H) 08/12/2017   NA 138 08/12/2017   K 3.6 08/12/2017   CL 107 08/12/2017   CREATININE 0.59 08/12/2017   BUN 17 08/12/2017   CO2 24 08/12/2017   INR 0.99 08/18/2017   Labs dated 06/18/16: cholesterol 140, triglycerides 68, HDL 50, LDL 76. A1c 5.3%. CMET and CBC normal. Dated 06/29/17: cholesterol 153, triglycerides 57, HDL 62, LDL 80. LFTs and TSH normal.   Ecg today shows NSR with a normal Ecg. Rate 68. I have personally reviewed and interpreted this study.    Assessment / Plan: 1. Paroxysmal atrial fibrillation.  Currently asymptomatic. Good rate control on diltiazem. On Xarelto for anticoagulation. Labs stable. I will follow up in one year.

## 2017-12-12 ENCOUNTER — Encounter: Payer: Self-pay | Admitting: Cardiology

## 2017-12-12 ENCOUNTER — Ambulatory Visit (INDEPENDENT_AMBULATORY_CARE_PROVIDER_SITE_OTHER): Payer: PPO | Admitting: Cardiology

## 2017-12-12 VITALS — BP 114/66 | HR 68 | Ht 59.0 in | Wt 110.0 lb

## 2017-12-12 DIAGNOSIS — S42202D Unspecified fracture of upper end of left humerus, subsequent encounter for fracture with routine healing: Secondary | ICD-10-CM | POA: Diagnosis not present

## 2017-12-12 DIAGNOSIS — I480 Paroxysmal atrial fibrillation: Secondary | ICD-10-CM | POA: Diagnosis not present

## 2017-12-12 DIAGNOSIS — M25612 Stiffness of left shoulder, not elsewhere classified: Secondary | ICD-10-CM | POA: Diagnosis not present

## 2017-12-12 DIAGNOSIS — M25512 Pain in left shoulder: Secondary | ICD-10-CM | POA: Diagnosis not present

## 2017-12-12 MED ORDER — RIVAROXABAN 20 MG PO TABS: 20.0000 mg | ORAL_TABLET | Freq: Every day | ORAL | 3 refills | Status: DC

## 2017-12-12 MED ORDER — DILTIAZEM HCL ER 180 MG PO CP24: 180.0000 mg | ORAL_CAPSULE | Freq: Every day | ORAL | 3 refills | Status: DC

## 2017-12-12 NOTE — Patient Instructions (Signed)
Continue your current therapy  I will see you in one year   

## 2017-12-14 ENCOUNTER — Ambulatory Visit (INDEPENDENT_AMBULATORY_CARE_PROVIDER_SITE_OTHER): Payer: PPO | Admitting: Orthopedic Surgery

## 2017-12-14 ENCOUNTER — Encounter (INDEPENDENT_AMBULATORY_CARE_PROVIDER_SITE_OTHER): Payer: Self-pay | Admitting: Orthopedic Surgery

## 2017-12-14 DIAGNOSIS — M25612 Stiffness of left shoulder, not elsewhere classified: Secondary | ICD-10-CM | POA: Diagnosis not present

## 2017-12-14 DIAGNOSIS — S42202D Unspecified fracture of upper end of left humerus, subsequent encounter for fracture with routine healing: Secondary | ICD-10-CM | POA: Diagnosis not present

## 2017-12-14 DIAGNOSIS — M7502 Adhesive capsulitis of left shoulder: Secondary | ICD-10-CM | POA: Diagnosis not present

## 2017-12-14 DIAGNOSIS — M25512 Pain in left shoulder: Secondary | ICD-10-CM | POA: Diagnosis not present

## 2017-12-14 DIAGNOSIS — G8929 Other chronic pain: Secondary | ICD-10-CM

## 2017-12-14 DIAGNOSIS — M25571 Pain in right ankle and joints of right foot: Secondary | ICD-10-CM | POA: Diagnosis not present

## 2017-12-14 MED ORDER — BACLOFEN 10 MG PO TABS: ORAL_TABLET | ORAL | 0 refills | Status: DC

## 2017-12-14 MED ORDER — HYDROCODONE-ACETAMINOPHEN 5-325 MG PO TABS: ORAL_TABLET | ORAL | 0 refills | Status: DC

## 2017-12-14 NOTE — Progress Notes (Signed)
Office Visit Note   Patient: Alyssa Martin           Date of Birth: 02/11/1946           MRN: [id number] Visit Date: 12/14/2017 Requested by: Marton Redwood, MD [address] [place], [place] [address] PCP: Marton Redwood, MD  Subjective: Chief Complaint  Patient presents with  . Left Shoulder - Follow-up    HPI: Alyssa Martin is a 72 year old patient who is now about 4 months out left humerus fracture fixation.  She is going to therapy 3 times a week to work on range of motion.  She wishes she was farther along than she is.  I reviewed the therapy note and she is making progress with her range of motion.              ROS: All systems reviewed are negative as they relate to the chief complaint within the history of present illness.  Patient denies  fevers or chills.   Assessment & Plan: Visit Diagnoses:  1. Adhesive capsulitis of left shoulder   2. Chronic pain of right ankle     Plan: Impression is adhesive capsulitis following left humerus fracture fixation.  Plan is continue physical therapy with 8-week return.  Decide then for or against some type of arthroscopic intervention capsular release and manipulation.  That would be a little risky in her case due to her fracture fixation.  Norco prescription 1 time provided #20 just to take at night.  She is not taking narcotic medicine every day.  We will add baclofen as well as a muscle relaxer.  Follow-up in 8 weeks for clinical recheck  Follow-Up Instructions: Return in about 8 weeks (around 02/08/2018).   Orders:  No orders of the defined types were placed in this encounter.  Meds ordered this encounter  Medications  . HYDROcodone-acetaminophen (NORCO/VICODIN) 5-325 MG tablet    Sig: 1 po q hs prn pain    Dispense:  20 tablet    Refill:  0  . baclofen (LIORESAL) 10 MG tablet    Sig: 1 po bid prn spasm    Dispense:  30 each    Refill:  0      Procedures: No procedures performed   Clinical Data: No additional  findings.  Objective: Vital Signs: There were no vitals taken for this visit.  Physical Exam:   Constitutional: Patient appears well-developed HEENT:  Head: Normocephalic Eyes:EOM are normal Neck: Normal range of motion Cardiovascular: Normal rate Pulmonary/chest: Effort normal Neurologic: Patient is alert Skin: Skin is warm Psychiatric: Patient has normal mood and affect    Ortho Exam: Orthopedic exam demonstrates reasonable strength.  Incision intact on the left shoulder.  External rotation is about 25 degrees.  Isolated glenohumeral forward flexion and abduction still below 90.  Motor sensory function to the left hand intact.  Specialty Comments:  No specialty comments available.  Imaging: No results found.   PMFS History: Patient Active Problem List   Diagnosis Date Noted  . Shoulder fracture, left 08/18/2017  . Paroxysmal A-fib ([facility]) 09/08/2012   Past Medical History:  Diagnosis Date  . Arthritis   . Closed fracture of left proximal humerus   . Closed right ankle fracture   . Depression with anxiety   . Elevated BP   . GERD (gastroesophageal reflux disease)   . Hyperlipidemia   . Hypertension   . Impaired fasting glucose   . Osteopenia   . Osteoporosis   . Paroxysmal atrial fibrillation (  [facility])   . Vitamin D deficiency   . Wears glasses     Family History  Problem Relation Age of Onset  . Hypertension Mother   . Osteoarthritis Mother   . Bone cancer Father   . Osteopenia Father   . Lung cancer Brother   . Lung cancer Paternal Grandmother   . Leukemia Paternal Grandfather   . Bone cancer Brother     Past Surgical History:  Procedure Laterality Date  . COLONOSCOPY  05/10/2011  . DIAGNOSTIC MAMMOGRAM  10 /09/2010  . ORIF HUMERUS FRACTURE Left 08/18/2017   Procedure: OPEN REDUCTION INTERNAL FIXATION (ORIF) LEFT PROXIMAL HUMERUS FRACTURE;  Surgeon: Meredith Pel, MD;  Location: [facility];  Service: Orthopedics;  Laterality: Left;  . TIBIA FRACTURE  SURGERY  04/2006   Left--From falling down the stairs  . TIBIA FRACTURE SURGERY     In her 74's   Social History   Occupational History  . Occupation: teacher-retired  Tobacco Use  . Smoking status: Never Smoker  . Smokeless tobacco: Never Used  Substance and Sexual Activity  . Alcohol use: No    Alcohol/week: 0.0 oz  . Drug use: No  . Sexual activity: Not on file

## 2017-12-16 DIAGNOSIS — S42202D Unspecified fracture of upper end of left humerus, subsequent encounter for fracture with routine healing: Secondary | ICD-10-CM | POA: Diagnosis not present

## 2017-12-16 DIAGNOSIS — M25512 Pain in left shoulder: Secondary | ICD-10-CM | POA: Diagnosis not present

## 2017-12-16 DIAGNOSIS — M25612 Stiffness of left shoulder, not elsewhere classified: Secondary | ICD-10-CM | POA: Diagnosis not present

## 2017-12-19 DIAGNOSIS — S42202D Unspecified fracture of upper end of left humerus, subsequent encounter for fracture with routine healing: Secondary | ICD-10-CM | POA: Diagnosis not present

## 2017-12-19 DIAGNOSIS — M25612 Stiffness of left shoulder, not elsewhere classified: Secondary | ICD-10-CM | POA: Diagnosis not present

## 2017-12-19 DIAGNOSIS — M25512 Pain in left shoulder: Secondary | ICD-10-CM | POA: Diagnosis not present

## 2017-12-21 DIAGNOSIS — M25512 Pain in left shoulder: Secondary | ICD-10-CM | POA: Diagnosis not present

## 2017-12-21 DIAGNOSIS — M25612 Stiffness of left shoulder, not elsewhere classified: Secondary | ICD-10-CM | POA: Diagnosis not present

## 2017-12-21 DIAGNOSIS — S42202D Unspecified fracture of upper end of left humerus, subsequent encounter for fracture with routine healing: Secondary | ICD-10-CM | POA: Diagnosis not present

## 2017-12-23 DIAGNOSIS — S42202D Unspecified fracture of upper end of left humerus, subsequent encounter for fracture with routine healing: Secondary | ICD-10-CM | POA: Diagnosis not present

## 2017-12-23 DIAGNOSIS — M25612 Stiffness of left shoulder, not elsewhere classified: Secondary | ICD-10-CM | POA: Diagnosis not present

## 2017-12-23 DIAGNOSIS — M25512 Pain in left shoulder: Secondary | ICD-10-CM | POA: Diagnosis not present

## 2017-12-26 ENCOUNTER — Telehealth (INDEPENDENT_AMBULATORY_CARE_PROVIDER_SITE_OTHER): Payer: Self-pay | Admitting: Orthopedic Surgery

## 2017-12-26 DIAGNOSIS — S42202D Unspecified fracture of upper end of left humerus, subsequent encounter for fracture with routine healing: Secondary | ICD-10-CM | POA: Diagnosis not present

## 2017-12-26 DIAGNOSIS — M25612 Stiffness of left shoulder, not elsewhere classified: Secondary | ICD-10-CM | POA: Diagnosis not present

## 2017-12-26 DIAGNOSIS — M25512 Pain in left shoulder: Secondary | ICD-10-CM | POA: Diagnosis not present

## 2017-12-26 NOTE — Telephone Encounter (Signed)
Patient called and wanted manipulation for her shoulder that was suggested by Dr Marlou Sa.  Please give patent a call to advise

## 2017-12-26 NOTE — Telephone Encounter (Signed)
Please advise.  If ok, please fill out blue sheet.

## 2017-12-27 NOTE — Telephone Encounter (Signed)
Ok for doa rotator interval release and debridement and manipulation pls post thx

## 2017-12-27 NOTE — Telephone Encounter (Signed)
See note from Dr Marlou Sa. Thanks.

## 2018-01-02 ENCOUNTER — Telehealth (INDEPENDENT_AMBULATORY_CARE_PROVIDER_SITE_OTHER): Payer: Self-pay | Admitting: Orthopedic Surgery

## 2018-01-02 NOTE — Telephone Encounter (Signed)
Patient said she had discussed a shoulder manipulation with Dr. Marlou Sa at her next appt on 5/1. She would like a call back just to talk to you to see whats involved. CB # [phone]

## 2018-01-02 NOTE — Telephone Encounter (Signed)
Please advise thanks.

## 2018-01-04 DIAGNOSIS — M25512 Pain in left shoulder: Secondary | ICD-10-CM | POA: Diagnosis not present

## 2018-01-04 DIAGNOSIS — S42202D Unspecified fracture of upper end of left humerus, subsequent encounter for fracture with routine healing: Secondary | ICD-10-CM | POA: Diagnosis not present

## 2018-01-04 DIAGNOSIS — M25612 Stiffness of left shoulder, not elsewhere classified: Secondary | ICD-10-CM | POA: Diagnosis not present

## 2018-01-05 NOTE — Telephone Encounter (Signed)
I called lmom

## 2018-01-10 NOTE — Telephone Encounter (Signed)
Alyssa Martin, you will need to start a blue sheet on this patient if it hasn't been started already.

## 2018-01-13 NOTE — Telephone Encounter (Signed)
Please see message below. Patient has appt scheduled with you on 5/1.

## 2018-01-13 NOTE — Telephone Encounter (Signed)
Patient just wanted to make sure Dr. Marlou Sa knows she has an appt scheduled for Wednesday 5/1. She said she had missed a call from him but if he needs to call her back before then her # is [phone].

## 2018-01-18 ENCOUNTER — Ambulatory Visit (INDEPENDENT_AMBULATORY_CARE_PROVIDER_SITE_OTHER): Payer: PPO | Admitting: Orthopedic Surgery

## 2018-01-18 ENCOUNTER — Encounter (INDEPENDENT_AMBULATORY_CARE_PROVIDER_SITE_OTHER): Payer: Self-pay | Admitting: Orthopedic Surgery

## 2018-01-18 ENCOUNTER — Telehealth: Payer: Self-pay

## 2018-01-18 DIAGNOSIS — M7502 Adhesive capsulitis of left shoulder: Secondary | ICD-10-CM | POA: Diagnosis not present

## 2018-01-18 NOTE — Progress Notes (Signed)
Office Visit Note   Patient: Alyssa Martin           Date of Birth: Mar 21, 1946           MRN: [id number] Visit Date: 01/18/2018 Requested by: Marton Redwood, MD [address] [place], [place] [address] PCP: Marton Redwood, MD  Subjective: Chief Complaint  Patient presents with  . Left Shoulder - Follow-up    HPI: [name] is a 72 year old patient with left shoulder pain.  She had open reduction internal fixation of left proximal humerus fracture done about 6 months ago.  She is recently plateaued in physical therapy but still has restricted motion.  She does have a diagnosis of osteoporosis.  She is also on Xarelto for atrial fibrillation.  She would like to have more range of motion in that shoulder.              ROS: All systems reviewed are negative as they relate to the chief complaint within the history of present illness.  Patient denies  fevers or chills.   Assessment & Plan: Visit Diagnoses:  1. Adhesive capsulitis of left shoulder     Plan: Impression is left shoulder adhesive capsulitis following proximal humerus fracture fixation.  Plan at this time is arthroscopy with rotator interval release and capsular release.  Would also plan to do gentle manipulation to try to get more range of motion.  Risk and benefits are discussed which in her case primarily include fracture as well as loss of range of motion and incomplete gains of range of motion.  Plan to use CPM machine day after surgery.  Prescriptions provided today for pain medicine and muscle relaxation.  All questions answered  Follow-Up Instructions: No follow-ups on file.   Orders:  No orders of the defined types were placed in this encounter.  No orders of the defined types were placed in this encounter.     Procedures: No procedures performed   Clinical Data: No additional findings.  Objective: Vital Signs: There were no vitals taken for this visit.  Physical Exam:   Constitutional: Patient appears  well-developed HEENT:  Head: Normocephalic Eyes:EOM are normal Neck: Normal range of motion Cardiovascular: Normal rate Pulmonary/chest: Effort normal Neurologic: Patient is alert Skin: Skin is warm Psychiatric: Patient has normal mood and affect    Ortho Exam: Orthopedic exam demonstrates full active and passive range of motion of the right shoulder.  On the left-hand side the patient has about 75 degrees of isolated glenohumeral abduction and about 90 degrees of isolated forward flexion.  Cuff strength is intact.  Radiographs show good alignment of the fracture.  Specialty Comments:  No specialty comments available.  Imaging: No results found.   PMFS History: Patient Active Problem List   Diagnosis Date Noted  . Shoulder fracture, left 08/18/2017  . Paroxysmal A-fib ([facility]) 09/08/2012   Past Medical History:  Diagnosis Date  . Arthritis   . Closed fracture of left proximal humerus   . Closed right ankle fracture   . Depression with anxiety   . Elevated BP   . GERD (gastroesophageal reflux disease)   . Hyperlipidemia   . Hypertension   . Impaired fasting glucose   . Osteopenia   . Osteoporosis   . Paroxysmal atrial fibrillation ([facility])   . Vitamin D deficiency   . Wears glasses     Family History  Problem Relation Age of Onset  . Hypertension Mother   . Osteoarthritis Mother   . Bone cancer Father   .  Osteopenia Father   . Lung cancer Brother   . Lung cancer Paternal Grandmother   . Leukemia Paternal Grandfather   . Bone cancer Brother     Past Surgical History:  Procedure Laterality Date  . COLONOSCOPY  05/10/2011  . DIAGNOSTIC MAMMOGRAM  10 /09/2010  . ORIF HUMERUS FRACTURE Left 08/18/2017   Procedure: OPEN REDUCTION INTERNAL FIXATION (ORIF) LEFT PROXIMAL HUMERUS FRACTURE;  Surgeon: Meredith Pel, MD;  Location: [facility];  Service: Orthopedics;  Laterality: Left;  . TIBIA FRACTURE SURGERY  04/2006   Left--From falling down the stairs  . TIBIA FRACTURE  SURGERY     In her 50's   Social History   Occupational History  . Occupation: teacher-retired  Tobacco Use  . Smoking status: Never Smoker  . Smokeless tobacco: Never Used  Substance and Sexual Activity  . Alcohol use: No    Alcohol/week: 0.0 oz  . Drug use: No  . Sexual activity: Not on file

## 2018-01-18 NOTE — Telephone Encounter (Addendum)
   [facility] Pre-operative Risk Assessment    Request for surgical clearance:  1. What type of surgery is being performed? Shoulder Arthroscopy with rotator interval release, debridement, and manipulation   2. When is this surgery scheduled? 01/23/18  3. What type of clearance is required (medical clearance vs. Pharmacy clearance to hold med vs. Both)?  Both  4. Are there any medications that need to be held prior to surgery and how long? Xarelto -requesting instruction for when pt can come off before surgery.  5. Practice name and name of physician performing surgery? [facility]   6. What is your office phone and fax number? [phone] Fax [phone]  7. Anesthesia type (None, local, MAC, general) ? Unknown   Meryl Crutch 01/18/2018, 5:09 PM  _________________________________________________________________   (provider comments below)

## 2018-01-19 NOTE — Telephone Encounter (Signed)
LMOM to CB. 

## 2018-01-19 NOTE — Telephone Encounter (Signed)
Pt takes Xarelto for afib with CHADS2VASc score of 3 (age, sex, HTN). CrCl is 26mL/min. Ok to hold Xarelto for 2-3 days prior to procedure as needed.

## 2018-01-19 NOTE — Telephone Encounter (Signed)
Routed to pharmacy for review of Xarelto. Will contact patient, as per pre-op protocol, once pharmacy has reviewed [facility].

## 2018-01-20 NOTE — Telephone Encounter (Signed)
   Primary Cardiologist: Peter Martinique, MD  Chart reviewed as part of pre-operative protocol coverage. Given past medical history and time since last visit, based on ACC/AHA guidelines, Alyssa Martin would be at acceptable risk for the planned procedure without further cardiovascular testing.  She should hold Xarelto 48 hours prior to procedure and resume post procedure that night or next day if possible.   I will route this recommendation to the requesting party via Epic fax function and remove from pre-op pool.  Please call with questions.  Cecilie Kicks, NP 01/20/2018, 2:58 PM

## 2018-01-20 NOTE — Telephone Encounter (Signed)
Follow up   Pt returning call for pre op

## 2018-01-20 NOTE — Telephone Encounter (Signed)
I have manually faxed clearance to [facility] today. Pt's surgery is 01/23/18.

## 2018-01-23 ENCOUNTER — Encounter (INDEPENDENT_AMBULATORY_CARE_PROVIDER_SITE_OTHER): Payer: Self-pay | Admitting: Orthopedic Surgery

## 2018-01-23 ENCOUNTER — Telehealth (INDEPENDENT_AMBULATORY_CARE_PROVIDER_SITE_OTHER): Payer: Self-pay

## 2018-01-23 DIAGNOSIS — M7502 Adhesive capsulitis of left shoulder: Secondary | ICD-10-CM | POA: Diagnosis not present

## 2018-01-23 DIAGNOSIS — M659 Synovitis and tenosynovitis, unspecified: Secondary | ICD-10-CM | POA: Diagnosis not present

## 2018-01-23 DIAGNOSIS — G8918 Other acute postprocedural pain: Secondary | ICD-10-CM | POA: Diagnosis not present

## 2018-01-23 MED ORDER — BACLOFEN 10 MG PO TABS: ORAL_TABLET | ORAL | 0 refills | Status: DC

## 2018-01-23 NOTE — Addendum Note (Signed)
Addended byLaurann Montana on: 01/23/2018 03:55 PM   Modules accepted: Orders

## 2018-01-23 NOTE — Telephone Encounter (Signed)
Baclofen 10 mg po tid prn # 30 pls calla htx

## 2018-01-23 NOTE — Telephone Encounter (Signed)
Patients insurance will not cover robaxin. Please advise what you want me to change medication to. Marydel.  Thanks.

## 2018-01-23 NOTE — Telephone Encounter (Signed)
rx change submitted to patients pharmacy.

## 2018-02-01 ENCOUNTER — Encounter (INDEPENDENT_AMBULATORY_CARE_PROVIDER_SITE_OTHER): Payer: Self-pay | Admitting: Orthopedic Surgery

## 2018-02-01 ENCOUNTER — Ambulatory Visit (INDEPENDENT_AMBULATORY_CARE_PROVIDER_SITE_OTHER): Payer: PPO | Admitting: Orthopedic Surgery

## 2018-02-01 DIAGNOSIS — M7502 Adhesive capsulitis of left shoulder: Secondary | ICD-10-CM

## 2018-02-01 NOTE — Progress Notes (Signed)
   Post-Op Visit Note   Patient: Alyssa Martin           Date of Birth: 1946-05-05           MRN: [id number] Visit Date: 02/01/2018 PCP: Marton Redwood, MD   Assessment & Plan:  Chief Complaint:  Chief Complaint  Patient presents with  . Left Shoulder - Routine Post Op   Visit Diagnoses:  1. Adhesive capsulitis of left shoulder     Plan: Alyssa Martin is a 72 year old patient with left proximal humerus fracture status post manipulation and rotator interval release.  On examination her range of motion is better today.  Is been spending about 6 hours a day in the CPM machine.  She is at 112.  I would like her to max out the CPM machine.  We will also start her in manual physical therapy to achieve more range of motion.  Today she has about 35 degrees of external rotation of 15 degrees of abduction along with forward flexion and abduction at about 90 degrees.  I will see her back in 4 weeks..  She does feel like she has improved motion compared to preop.  Follow-Up Instructions: No follow-ups on file.   Orders:  No orders of the defined types were placed in this encounter.  No orders of the defined types were placed in this encounter.   Imaging: No results found.  PMFS History: Patient Active Problem List   Diagnosis Date Noted  . Shoulder fracture, left 08/18/2017  . Paroxysmal A-fib ([place]) 09/08/2012   Past Medical History:  Diagnosis Date  . Arthritis   . Closed fracture of left proximal humerus   . Closed right ankle fracture   . Depression with anxiety   . Elevated BP   . GERD (gastroesophageal reflux disease)   . Hyperlipidemia   . Hypertension   . Impaired fasting glucose   . Osteopenia   . Osteoporosis   . Paroxysmal atrial fibrillation (HCC)   . Vitamin D deficiency   . Wears glasses     Family History  Problem Relation Age of Onset  . Hypertension Mother   . Osteoarthritis Mother   . Bone cancer Father   . Osteopenia Father   . Lung cancer Brother   . Lung  cancer Paternal Grandmother   . Leukemia Paternal Grandfather   . Bone cancer Brother     Past Surgical History:  Procedure Laterality Date  . COLONOSCOPY  05/10/2011  . DIAGNOSTIC MAMMOGRAM  10 /09/2010  . ORIF HUMERUS FRACTURE Left 08/18/2017   Procedure: OPEN REDUCTION INTERNAL FIXATION (ORIF) LEFT PROXIMAL HUMERUS FRACTURE;  Surgeon: Meredith Pel, MD;  Location: [facility];  Service: Orthopedics;  Laterality: Left;  . TIBIA FRACTURE SURGERY  04/2006   Left--From falling down the stairs  . TIBIA FRACTURE SURGERY     In her 29's   Social History   Occupational History  . Occupation: teacher-retired  Tobacco Use  . Smoking status: Never Smoker  . Smokeless tobacco: Never Used  Substance and Sexual Activity  . Alcohol use: No    Alcohol/week: 0.0 oz  . Drug use: No  . Sexual activity: Not on file

## 2018-02-08 DIAGNOSIS — M25612 Stiffness of left shoulder, not elsewhere classified: Secondary | ICD-10-CM | POA: Diagnosis not present

## 2018-02-08 DIAGNOSIS — M25512 Pain in left shoulder: Secondary | ICD-10-CM | POA: Diagnosis not present

## 2018-02-08 DIAGNOSIS — S42202D Unspecified fracture of upper end of left humerus, subsequent encounter for fracture with routine healing: Secondary | ICD-10-CM | POA: Diagnosis not present

## 2018-02-09 ENCOUNTER — Ambulatory Visit (INDEPENDENT_AMBULATORY_CARE_PROVIDER_SITE_OTHER): Payer: PPO | Admitting: Orthopedic Surgery

## 2018-02-10 DIAGNOSIS — M25612 Stiffness of left shoulder, not elsewhere classified: Secondary | ICD-10-CM | POA: Diagnosis not present

## 2018-02-10 DIAGNOSIS — S42202D Unspecified fracture of upper end of left humerus, subsequent encounter for fracture with routine healing: Secondary | ICD-10-CM | POA: Diagnosis not present

## 2018-02-10 DIAGNOSIS — M25512 Pain in left shoulder: Secondary | ICD-10-CM | POA: Diagnosis not present

## 2018-02-15 DIAGNOSIS — S42202D Unspecified fracture of upper end of left humerus, subsequent encounter for fracture with routine healing: Secondary | ICD-10-CM | POA: Diagnosis not present

## 2018-02-15 DIAGNOSIS — M25612 Stiffness of left shoulder, not elsewhere classified: Secondary | ICD-10-CM | POA: Diagnosis not present

## 2018-02-15 DIAGNOSIS — M25512 Pain in left shoulder: Secondary | ICD-10-CM | POA: Diagnosis not present

## 2018-02-17 DIAGNOSIS — M25612 Stiffness of left shoulder, not elsewhere classified: Secondary | ICD-10-CM | POA: Diagnosis not present

## 2018-02-17 DIAGNOSIS — M25512 Pain in left shoulder: Secondary | ICD-10-CM | POA: Diagnosis not present

## 2018-02-17 DIAGNOSIS — S42202D Unspecified fracture of upper end of left humerus, subsequent encounter for fracture with routine healing: Secondary | ICD-10-CM | POA: Diagnosis not present

## 2018-02-20 DIAGNOSIS — M25512 Pain in left shoulder: Secondary | ICD-10-CM | POA: Diagnosis not present

## 2018-02-20 DIAGNOSIS — S42202D Unspecified fracture of upper end of left humerus, subsequent encounter for fracture with routine healing: Secondary | ICD-10-CM | POA: Diagnosis not present

## 2018-02-20 DIAGNOSIS — M25612 Stiffness of left shoulder, not elsewhere classified: Secondary | ICD-10-CM | POA: Diagnosis not present

## 2018-02-22 DIAGNOSIS — M25612 Stiffness of left shoulder, not elsewhere classified: Secondary | ICD-10-CM | POA: Diagnosis not present

## 2018-02-22 DIAGNOSIS — M25512 Pain in left shoulder: Secondary | ICD-10-CM | POA: Diagnosis not present

## 2018-02-22 DIAGNOSIS — S46202D Unspecified injury of muscle, fascia and tendon of other parts of biceps, left arm, subsequent encounter: Secondary | ICD-10-CM | POA: Diagnosis not present

## 2018-02-24 DIAGNOSIS — M25512 Pain in left shoulder: Secondary | ICD-10-CM | POA: Diagnosis not present

## 2018-02-24 DIAGNOSIS — M25612 Stiffness of left shoulder, not elsewhere classified: Secondary | ICD-10-CM | POA: Diagnosis not present

## 2018-02-24 DIAGNOSIS — S42202D Unspecified fracture of upper end of left humerus, subsequent encounter for fracture with routine healing: Secondary | ICD-10-CM | POA: Diagnosis not present

## 2018-03-01 ENCOUNTER — Encounter (INDEPENDENT_AMBULATORY_CARE_PROVIDER_SITE_OTHER): Payer: Self-pay | Admitting: Orthopedic Surgery

## 2018-03-01 ENCOUNTER — Ambulatory Visit (INDEPENDENT_AMBULATORY_CARE_PROVIDER_SITE_OTHER): Payer: PPO | Admitting: Orthopedic Surgery

## 2018-03-01 DIAGNOSIS — M25512 Pain in left shoulder: Secondary | ICD-10-CM | POA: Diagnosis not present

## 2018-03-01 DIAGNOSIS — S42202D Unspecified fracture of upper end of left humerus, subsequent encounter for fracture with routine healing: Secondary | ICD-10-CM | POA: Diagnosis not present

## 2018-03-01 DIAGNOSIS — M25612 Stiffness of left shoulder, not elsewhere classified: Secondary | ICD-10-CM | POA: Diagnosis not present

## 2018-03-01 DIAGNOSIS — M7502 Adhesive capsulitis of left shoulder: Secondary | ICD-10-CM

## 2018-03-01 NOTE — Progress Notes (Signed)
   Post-Op Visit Note   Patient: Alyssa Martin           Date of Birth: 1946/06/03           MRN: [id number] Visit Date: 03/01/2018 PCP: Marton Redwood, MD   Assessment & Plan:  Chief Complaint:  Chief Complaint  Patient presents with  . Left Shoulder - Follow-up   Visit Diagnoses:  1. Adhesive capsulitis of left shoulder     Plan: [name] is a patient is doing well following left humerus fracture fixation in November with subsequent manipulation and rotator interval release in May.  She is doing therapy 3 times a week plus home exercise program using a pulley.  She is making good progress.  Taking Tylenol for pain.  On examination she has forward flexion and abduction both above 90 and external rotation to about 40 degrees on the left-hand side.  I want her to continue therapy for the next month to work on manual therapy for facilitating range of motion.  I will see her back as needed.  In general she is making good progress and has more motion than when she had prior to surgery.  I will see her back as needed  Follow-Up Instructions: Return if symptoms worsen or fail to improve.   Orders:  No orders of the defined types were placed in this encounter.  No orders of the defined types were placed in this encounter.   Imaging: No results found.  PMFS History: Patient Active Problem List   Diagnosis Date Noted  . Shoulder fracture, left 08/18/2017  . Paroxysmal A-fib ([place]) 09/08/2012   Past Medical History:  Diagnosis Date  . Arthritis   . Closed fracture of left proximal humerus   . Closed right ankle fracture   . Depression with anxiety   . Elevated BP   . GERD (gastroesophageal reflux disease)   . Hyperlipidemia   . Hypertension   . Impaired fasting glucose   . Osteopenia   . Osteoporosis   . Paroxysmal atrial fibrillation (H[facility])   . Vitamin D deficiency   . Wears glasses     Family History  Problem Relation Age of Onset  . Hypertension Mother   .  Osteoarthritis Mother   . Bone cancer Father   . Osteopenia Father   . Lung cancer Brother   . Lung cancer Paternal Grandmother   . Leukemia Paternal Grandfather   . Bone cancer Brother     Past Surgical History:  Procedure Laterality Date  . COLONOSCOPY  05/10/2011  . DIAGNOSTIC MAMMOGRAM  10 /09/2010  . ORIF HUMERUS FRACTURE Left 08/18/2017   Procedure: OPEN REDUCTION INTERNAL FIXATION (ORIF) LEFT PROXIMAL HUMERUS FRACTURE;  Surgeon: Meredith Pel, MD;  Location: [place];  Service: Orthopedics;  Laterality: Left;  . TIBIA FRACTURE SURGERY  04/2006   Left--From falling down the stairs  . TIBIA FRACTURE SURGERY     In her 22's   Social History   Occupational History  . Occupation: teacher-retired  Tobacco Use  . Smoking status: Never Smoker  . Smokeless tobacco: Never Used  Substance and Sexual Activity  . Alcohol use: No    Alcohol/week: 0.0 oz  . Drug use: No  . Sexual activity: Not on file

## 2018-03-06 DIAGNOSIS — S42202D Unspecified fracture of upper end of left humerus, subsequent encounter for fracture with routine healing: Secondary | ICD-10-CM | POA: Diagnosis not present

## 2018-03-06 DIAGNOSIS — M25512 Pain in left shoulder: Secondary | ICD-10-CM | POA: Diagnosis not present

## 2018-03-06 DIAGNOSIS — M25612 Stiffness of left shoulder, not elsewhere classified: Secondary | ICD-10-CM | POA: Diagnosis not present

## 2018-03-14 DIAGNOSIS — M25612 Stiffness of left shoulder, not elsewhere classified: Secondary | ICD-10-CM | POA: Diagnosis not present

## 2018-03-14 DIAGNOSIS — M25512 Pain in left shoulder: Secondary | ICD-10-CM | POA: Diagnosis not present

## 2018-03-14 DIAGNOSIS — S42202D Unspecified fracture of upper end of left humerus, subsequent encounter for fracture with routine healing: Secondary | ICD-10-CM | POA: Diagnosis not present

## 2018-04-18 DIAGNOSIS — Z7901 Long term (current) use of anticoagulants: Secondary | ICD-10-CM | POA: Diagnosis not present

## 2018-04-18 DIAGNOSIS — M546 Pain in thoracic spine: Secondary | ICD-10-CM | POA: Diagnosis not present

## 2018-04-18 DIAGNOSIS — M810 Age-related osteoporosis without current pathological fracture: Secondary | ICD-10-CM | POA: Diagnosis not present

## 2018-04-18 DIAGNOSIS — I10 Essential (primary) hypertension: Secondary | ICD-10-CM | POA: Diagnosis not present

## 2018-04-18 DIAGNOSIS — Z6823 Body mass index (BMI) 23.0-23.9, adult: Secondary | ICD-10-CM | POA: Diagnosis not present

## 2018-04-18 DIAGNOSIS — M25551 Pain in right hip: Secondary | ICD-10-CM | POA: Diagnosis not present

## 2018-05-25 DIAGNOSIS — D485 Neoplasm of uncertain behavior of skin: Secondary | ICD-10-CM | POA: Diagnosis not present

## 2018-05-25 DIAGNOSIS — D1801 Hemangioma of skin and subcutaneous tissue: Secondary | ICD-10-CM | POA: Diagnosis not present

## 2018-05-25 DIAGNOSIS — C4361 Malignant melanoma of right upper limb, including shoulder: Secondary | ICD-10-CM | POA: Diagnosis not present

## 2018-05-25 DIAGNOSIS — L821 Other seborrheic keratosis: Secondary | ICD-10-CM | POA: Diagnosis not present

## 2018-06-12 DIAGNOSIS — C4361 Malignant melanoma of right upper limb, including shoulder: Secondary | ICD-10-CM | POA: Diagnosis not present

## 2018-06-12 DIAGNOSIS — L988 Other specified disorders of the skin and subcutaneous tissue: Secondary | ICD-10-CM | POA: Diagnosis not present

## 2018-06-12 DIAGNOSIS — Z85820 Personal history of malignant melanoma of skin: Secondary | ICD-10-CM | POA: Diagnosis not present

## 2018-06-22 DIAGNOSIS — Z4802 Encounter for removal of sutures: Secondary | ICD-10-CM | POA: Diagnosis not present

## 2018-06-26 DIAGNOSIS — Z23 Encounter for immunization: Secondary | ICD-10-CM | POA: Diagnosis not present

## 2018-07-12 DIAGNOSIS — M810 Age-related osteoporosis without current pathological fracture: Secondary | ICD-10-CM | POA: Diagnosis not present

## 2018-07-12 DIAGNOSIS — R7301 Impaired fasting glucose: Secondary | ICD-10-CM | POA: Diagnosis not present

## 2018-07-12 DIAGNOSIS — R82998 Other abnormal findings in urine: Secondary | ICD-10-CM | POA: Diagnosis not present

## 2018-07-12 DIAGNOSIS — E7849 Other hyperlipidemia: Secondary | ICD-10-CM | POA: Diagnosis not present

## 2018-07-12 DIAGNOSIS — I10 Essential (primary) hypertension: Secondary | ICD-10-CM | POA: Diagnosis not present

## 2018-07-19 DIAGNOSIS — R3129 Other microscopic hematuria: Secondary | ICD-10-CM | POA: Diagnosis not present

## 2018-07-19 DIAGNOSIS — M810 Age-related osteoporosis without current pathological fracture: Secondary | ICD-10-CM | POA: Diagnosis not present

## 2018-07-19 DIAGNOSIS — R7301 Impaired fasting glucose: Secondary | ICD-10-CM | POA: Diagnosis not present

## 2018-07-19 DIAGNOSIS — E559 Vitamin D deficiency, unspecified: Secondary | ICD-10-CM | POA: Diagnosis not present

## 2018-07-19 DIAGNOSIS — I10 Essential (primary) hypertension: Secondary | ICD-10-CM | POA: Diagnosis not present

## 2018-07-19 DIAGNOSIS — I480 Paroxysmal atrial fibrillation: Secondary | ICD-10-CM | POA: Diagnosis not present

## 2018-07-19 DIAGNOSIS — H8113 Benign paroxysmal vertigo, bilateral: Secondary | ICD-10-CM | POA: Diagnosis not present

## 2018-07-19 DIAGNOSIS — Z7901 Long term (current) use of anticoagulants: Secondary | ICD-10-CM | POA: Diagnosis not present

## 2018-07-19 DIAGNOSIS — E7849 Other hyperlipidemia: Secondary | ICD-10-CM | POA: Diagnosis not present

## 2018-07-19 DIAGNOSIS — F3342 Major depressive disorder, recurrent, in full remission: Secondary | ICD-10-CM | POA: Diagnosis not present

## 2018-07-19 DIAGNOSIS — Z6823 Body mass index (BMI) 23.0-23.9, adult: Secondary | ICD-10-CM | POA: Diagnosis not present

## 2018-07-19 DIAGNOSIS — Z0000 Encounter for general adult medical examination without abnormal findings: Secondary | ICD-10-CM | POA: Diagnosis not present

## 2018-08-03 ENCOUNTER — Ambulatory Visit (HOSPITAL_COMMUNITY): Payer: PPO

## 2018-08-11 ENCOUNTER — Ambulatory Visit (HOSPITAL_COMMUNITY): Payer: PPO

## 2018-09-04 DIAGNOSIS — Z1231 Encounter for screening mammogram for malignant neoplasm of breast: Secondary | ICD-10-CM | POA: Diagnosis not present

## 2018-09-04 DIAGNOSIS — Z6823 Body mass index (BMI) 23.0-23.9, adult: Secondary | ICD-10-CM | POA: Diagnosis not present

## 2018-09-04 DIAGNOSIS — Z124 Encounter for screening for malignant neoplasm of cervix: Secondary | ICD-10-CM | POA: Diagnosis not present

## 2018-09-27 DIAGNOSIS — D1801 Hemangioma of skin and subcutaneous tissue: Secondary | ICD-10-CM | POA: Diagnosis not present

## 2018-09-27 DIAGNOSIS — L738 Other specified follicular disorders: Secondary | ICD-10-CM | POA: Diagnosis not present

## 2018-09-27 DIAGNOSIS — D225 Melanocytic nevi of trunk: Secondary | ICD-10-CM | POA: Diagnosis not present

## 2018-09-27 DIAGNOSIS — Z85820 Personal history of malignant melanoma of skin: Secondary | ICD-10-CM | POA: Diagnosis not present

## 2018-10-12 DIAGNOSIS — Z6824 Body mass index (BMI) 24.0-24.9, adult: Secondary | ICD-10-CM | POA: Diagnosis not present

## 2018-10-12 DIAGNOSIS — M810 Age-related osteoporosis without current pathological fracture: Secondary | ICD-10-CM | POA: Diagnosis not present

## 2018-10-12 DIAGNOSIS — Z79899 Other long term (current) drug therapy: Secondary | ICD-10-CM | POA: Diagnosis not present

## 2018-10-12 DIAGNOSIS — E559 Vitamin D deficiency, unspecified: Secondary | ICD-10-CM | POA: Diagnosis not present

## 2018-10-30 ENCOUNTER — Telehealth (HOSPITAL_COMMUNITY): Payer: Self-pay | Admitting: General Practice

## 2018-11-02 ENCOUNTER — Ambulatory Visit (HOSPITAL_COMMUNITY)
Admission: RE | Admit: 2018-11-02 | Discharge: 2018-11-02 | Disposition: A | Discharge status: Home / Self Care | Payer: PPO | Source: Ambulatory Visit | Attending: Internal Medicine | Admitting: Internal Medicine

## 2018-11-02 ENCOUNTER — Encounter (HOSPITAL_COMMUNITY): Payer: Self-pay

## 2018-11-02 DIAGNOSIS — M810 Age-related osteoporosis without current pathological fracture: Secondary | ICD-10-CM | POA: Diagnosis not present

## 2018-11-02 MED ORDER — ZOLEDRONIC ACID 5 MG/100ML IV SOLN
5.0000 mg | Freq: Once | INTRAVENOUS | Status: AC
  Administered 2018-11-02: 5 mg via INTRAVENOUS
  Filled 2018-11-02: qty 100

## 2018-11-02 MED ORDER — SODIUM CHLORIDE 0.9 % IV SOLN
INTRAVENOUS | Status: DC
  Administered 2018-11-02: 11:00:00 via INTRAVENOUS

## 2018-11-02 NOTE — Discharge Instructions (Signed)

## 2018-11-14 DIAGNOSIS — R1310 Dysphagia, unspecified: Secondary | ICD-10-CM | POA: Diagnosis not present

## 2018-11-14 DIAGNOSIS — Z1211 Encounter for screening for malignant neoplasm of colon: Secondary | ICD-10-CM | POA: Diagnosis not present

## 2018-11-14 DIAGNOSIS — K219 Gastro-esophageal reflux disease without esophagitis: Secondary | ICD-10-CM | POA: Diagnosis not present

## 2018-11-15 ENCOUNTER — Telehealth: Payer: Self-pay | Admitting: *Deleted

## 2018-11-15 NOTE — Telephone Encounter (Signed)
   [facility] Pre-operative Risk Assessment    Request for surgical clearance:  1. What type of surgery is being performed?  EGD and COLONOSCOPY   2. When is this surgery scheduled? MARCH 23,2020  3. What type of clearance is required (medical clearance vs. Pharmacy clearance to hold med vs. Both)? BOTH  4. Are there any medications that need to be held prior to surgery and how long?[place]rege  5. Practice name and name of physician performing surgery?  [facility],PA: DR Juanita Craver  6. What is your office phone number [phone]    7.   What is your office fax number [phone]  8.   Anesthesia type (None, local, MAC, general) ? PROPOFOL   Raiford Simmonds 11/15/2018, 3:52 PM  _________________________________________________________________   (provider comments below)

## 2018-11-15 NOTE — Telephone Encounter (Signed)
Pt takes Xarelto for afib with CHADS2VASc score of 3 (age, sex, HTN).  CrCl 49.34 (SCr 0.8 in [facility] from January 2020), previously > 48mL/min. Will continue on Xarelto 20mg  dose but monitor CrCl closely for potential future dose reduction.  Ok to hold Xarelto for 1-2 days prior to procedure.

## 2018-11-16 NOTE — Telephone Encounter (Signed)
   Primary Cardiologist: Peter Martinique, MD  Chart reviewed as part of pre-operative protocol coverage. Patient was contacted 11/16/2018 in reference to pre-operative risk assessment for pending surgery as outlined below.  Alyssa Martin was last seen on 3/25/19v by Dr. Martinique.  Since that day, Alyssa Martin has done well w/o any cardiac symptoms. No CP, dyspnea or palpitations.   Therefore, based on ACC/AHA guidelines, the patient would be at acceptable risk for the planned procedure without further cardiovascular testing.   Per pharmacy, [place] to hold Xarelto for 1-2 days prior to procedure.  I will route this recommendation to the requesting party via Epic fax function and remove from pre-op pool.  Please call with questions.  Lyda Jester, PA-C 11/16/2018, 9:12 AM

## 2018-11-24 ENCOUNTER — Other Ambulatory Visit: Payer: Self-pay

## 2018-11-24 MED ORDER — RIVAROXABAN 20 MG PO TABS: 20.0000 mg | ORAL_TABLET | Freq: Every day | ORAL | 1 refills | Status: DC

## 2018-11-28 ENCOUNTER — Other Ambulatory Visit: Payer: Self-pay

## 2018-11-28 MED ORDER — RIVAROXABAN 20 MG PO TABS: 20.0000 mg | ORAL_TABLET | Freq: Every day | ORAL | 0 refills | Status: DC

## 2018-12-21 ENCOUNTER — Telehealth: Payer: Self-pay | Admitting: Cardiology

## 2018-12-21 NOTE — Telephone Encounter (Signed)
If her symptoms are stable and she is doing well she can wait til August.   Peter Martinique MD, [facility]

## 2018-12-21 NOTE — Telephone Encounter (Signed)
Routed to Dr. Martinique and his nurse to determine if televisit appropriate for pt

## 2018-12-21 NOTE — Telephone Encounter (Signed)
Spoke to patient she stated she is doing good.No complaints.4/6 appointment rescheduled to 05/10/19 at 3:00 pm.Advised to call sooner if needed.

## 2018-12-21 NOTE — Telephone Encounter (Signed)
New Message   Patient inquiring about televisit and would like a nurse to call her to see if it's a necessity to keep her appointment or schedule in August.

## 2018-12-27 ENCOUNTER — Ambulatory Visit: Payer: PPO | Admitting: Cardiology

## 2019-01-31 ENCOUNTER — Other Ambulatory Visit: Payer: Self-pay

## 2019-01-31 MED ORDER — DILTIAZEM HCL ER 180 MG PO CP24: 180.0000 mg | ORAL_CAPSULE | Freq: Every day | ORAL | 3 refills | Status: DC

## 2019-05-04 NOTE — Progress Notes (Signed)
Virtual Visit via Telephone Note   This visit type was conducted due to national recommendations for restrictions regarding the COVID-19 Pandemic (e.g. social distancing) in an effort to limit this patient's exposure and mitigate transmission in our community.  Due to her co-morbid illnesses, this patient is at least at moderate risk for complications without adequate follow up.  This format is felt to be most appropriate for this patient at this time.  The patient did not have access to video technology/had technical difficulties with video requiring transitioning to audio format only (telephone).  All issues noted in this document were discussed and addressed.  No physical exam could be performed with this format.  Please refer to the patient's chart for her  consent to telehealth for [facility].   Date:  05/04/2019   ID:  Alyssa Martin, DOB 15-Dec-1945, MRN [id number]  Patient Location: Home Provider Location: Home  PCP:  Marton Redwood, MD  Cardiologist:  Peter Martinique, MD  Electrophysiologist:  None   Evaluation Performed:  Follow-Up Visit  Chief Complaint:  Afib  History of Present Illness:    Alyssa Martin is a 73 y.o. female with a history of paroxysmal atrial fibrillation dating back to 2013.  She has been on cardizem and Xarelto.  She reports a few times where her heart beat faster but not clearly Afib.  No dizziness, tachycardia, chest pain or SOB.   The patient does not have symptoms concerning for COVID-19 infection (fever, chills, cough, or new shortness of breath).    Past Medical History:  Diagnosis Date  . Arthritis   . Closed fracture of left proximal humerus   . Closed right ankle fracture   . Depression with anxiety   . Elevated BP   . GERD (gastroesophageal reflux disease)   . Hyperlipidemia   . Hypertension   . Impaired fasting glucose   . Osteopenia   . Osteoporosis   . Paroxysmal atrial fibrillation (HCC)   . Vitamin D deficiency   . Wears  glasses    Past Surgical History:  Procedure Laterality Date  . COLONOSCOPY  05/10/2011  . DIAGNOSTIC MAMMOGRAM  10 /09/2010  . ORIF HUMERUS FRACTURE Left 08/18/2017   Procedure: OPEN REDUCTION INTERNAL FIXATION (ORIF) LEFT PROXIMAL HUMERUS FRACTURE;  Surgeon: Meredith Pel, MD;  Location: [place];  Service: Orthopedics;  Laterality: Left;  . TIBIA FRACTURE SURGERY  04/2006   Left--From falling down the stairs  . TIBIA FRACTURE SURGERY     In her 57's     No outpatient medications have been marked as taking for the 05/10/19 encounter (Appointment) with Martinique, Peter M, MD.     Allergies:   Penicillins and Ativan [lorazepam]   Social History   Tobacco Use  . Smoking status: Never Smoker  . Smokeless tobacco: Never Used  Substance Use Topics  . Alcohol use: No    Alcohol/week: 0.0 standard drinks  . Drug use: No     Family Hx: The patient's family history includes Bone cancer in her brother and father; Hypertension in her mother; Leukemia in her paternal grandfather; Lung cancer in her brother and paternal grandmother; Osteoarthritis in her mother; Osteopenia in her father.  ROS:   Please see the history of present illness.    All other systems reviewed and are negative.   Prior CV studies:   The following studies were reviewed today:  none  Labs/Other Tests and Data Reviewed:    EKG:  No ECG reviewed.  Recent Labs: No results found for requested labs within last 8760 hours.   Recent Lipid Panel No results found for: CHOL, TRIG, HDL, CHOLHDL, LDLCALC, LDLDIRECT   Labs dated 07/12/18: cholesterol 160, triglycerides 63, HDL 69, LDL 78. A1c 5.4%. CBC, CMET and TSH normal.  Wt Readings from Last 3 Encounters:  12/12/17 110 lb (49.9 kg)  08/18/17 112 lb (50.8 kg)  08/12/17 112 lb (50.8 kg)     Objective:    Vital Signs:  There were no vitals taken for this visit.   VITAL SIGNS:  reviewed  ASSESSMENT & PLAN:    1. Paroxysmal atrial fibrillation.   Currently asymptomatic. Good rate control on diltiazem. On Xarelto for anticoagulation.   COVID-19 Education: The signs and symptoms of COVID-19 were discussed with the patient and how to seek care for testing (follow up with PCP or arrange E-visit).  The importance of social distancing was discussed today.  Time:   Today, I have spent 10 minutes with the patient with telehealth technology discussing the above problems.     Medication Adjustments/Labs and Tests Ordered: Current medicines are reviewed at length with the patient today.  Concerns regarding medicines are outlined above.   Tests Ordered: No orders of the defined types were placed in this encounter.   Medication Changes: No orders of the defined types were placed in this encounter.   Follow Up:  In Person in 1 year(s)  Signed, Peter Martinique, MD  05/04/2019 2:34 PM    [place]

## 2019-05-10 ENCOUNTER — Encounter: Payer: Self-pay | Admitting: Cardiology

## 2019-05-10 ENCOUNTER — Telehealth (INDEPENDENT_AMBULATORY_CARE_PROVIDER_SITE_OTHER): Payer: PPO | Admitting: Cardiology

## 2019-05-10 ENCOUNTER — Other Ambulatory Visit: Payer: Self-pay

## 2019-05-10 VITALS — Ht 59.0 in

## 2019-05-10 DIAGNOSIS — I480 Paroxysmal atrial fibrillation: Secondary | ICD-10-CM | POA: Diagnosis not present

## 2019-05-10 MED ORDER — RIVAROXABAN 20 MG PO TABS: 20.0000 mg | ORAL_TABLET | Freq: Every day | ORAL | 3 refills | Status: DC

## 2019-05-10 NOTE — Patient Instructions (Signed)
Medication Instructions:  Continue same medications If you need a refill on your cardiac medications before your next appointment, please call your pharmacy.   Lab work: None ordered   Testing/Procedures: None ordered  Follow-Up: At [facility], you and your health needs are our priority.  As part of our continuing mission to provide you with exceptional heart care, we have created designated Provider Care Teams.  These Care Teams include your primary Cardiologist (physician) and Advanced Practice Providers (APPs -  Physician Assistants and Nurse Practitioners) who all work together to provide you with the care you need, when you need it. . Schedule follow up appointment in 1 year  Call in May to schedule August appointment

## 2019-05-17 DIAGNOSIS — H2512 Age-related nuclear cataract, left eye: Secondary | ICD-10-CM | POA: Diagnosis not present

## 2019-05-17 DIAGNOSIS — H25812 Combined forms of age-related cataract, left eye: Secondary | ICD-10-CM | POA: Diagnosis not present

## 2019-05-30 DIAGNOSIS — Z85820 Personal history of malignant melanoma of skin: Secondary | ICD-10-CM | POA: Diagnosis not present

## 2019-05-30 DIAGNOSIS — D1801 Hemangioma of skin and subcutaneous tissue: Secondary | ICD-10-CM | POA: Diagnosis not present

## 2019-05-30 DIAGNOSIS — L821 Other seborrheic keratosis: Secondary | ICD-10-CM | POA: Diagnosis not present

## 2019-06-02 DIAGNOSIS — Z23 Encounter for immunization: Secondary | ICD-10-CM | POA: Diagnosis not present

## 2019-06-07 DIAGNOSIS — H2511 Age-related nuclear cataract, right eye: Secondary | ICD-10-CM | POA: Diagnosis not present

## 2019-06-07 DIAGNOSIS — H25811 Combined forms of age-related cataract, right eye: Secondary | ICD-10-CM | POA: Diagnosis not present

## 2019-06-19 ENCOUNTER — Other Ambulatory Visit: Payer: Self-pay

## 2019-06-19 MED ORDER — RIVAROXABAN 20 MG PO TABS: 20.0000 mg | ORAL_TABLET | Freq: Every day | ORAL | 0 refills | Status: DC

## 2019-06-19 NOTE — Telephone Encounter (Signed)
Reviewd.  Pt just below cutoff for 20 mg dose.  Last SCr was January 2020.  Prior to that [facility] had been 0.7 for multiple draws.  Will refill x 1 for now at 20 mg dose and have patient repeat BMET in next month for verification of correct dose.

## 2019-06-19 NOTE — Telephone Encounter (Signed)
57F 49.9KG SCR 0.8 10/12/18 CCR 49.3 PT REQUESTING 20MG  XARELTO ONLY QUALIFIES FOR 15MG  CLINICIAN REVIEW NEEDED. RX SEND TO [place]

## 2019-07-02 DIAGNOSIS — R519 Headache, unspecified: Secondary | ICD-10-CM | POA: Diagnosis not present

## 2019-07-02 DIAGNOSIS — H748X2 Other specified disorders of left middle ear and mastoid: Secondary | ICD-10-CM | POA: Diagnosis not present

## 2019-07-02 DIAGNOSIS — H9202 Otalgia, left ear: Secondary | ICD-10-CM | POA: Diagnosis not present

## 2019-07-02 DIAGNOSIS — J329 Chronic sinusitis, unspecified: Secondary | ICD-10-CM | POA: Diagnosis not present

## 2019-07-02 DIAGNOSIS — I10 Essential (primary) hypertension: Secondary | ICD-10-CM | POA: Diagnosis not present

## 2019-07-17 DIAGNOSIS — R7301 Impaired fasting glucose: Secondary | ICD-10-CM | POA: Diagnosis not present

## 2019-07-17 DIAGNOSIS — E559 Vitamin D deficiency, unspecified: Secondary | ICD-10-CM | POA: Diagnosis not present

## 2019-07-17 DIAGNOSIS — E7849 Other hyperlipidemia: Secondary | ICD-10-CM | POA: Diagnosis not present

## 2019-07-18 ENCOUNTER — Other Ambulatory Visit: Payer: Self-pay | Admitting: Pharmacist

## 2019-07-18 ENCOUNTER — Other Ambulatory Visit: Payer: Self-pay

## 2019-07-18 DIAGNOSIS — I480 Paroxysmal atrial fibrillation: Secondary | ICD-10-CM

## 2019-07-18 MED ORDER — RIVAROXABAN 20 MG PO TABS: 20.0000 mg | ORAL_TABLET | Freq: Every day | ORAL | 0 refills | Status: DC

## 2019-07-18 NOTE — Telephone Encounter (Signed)
This encounter was created in error - please disregard.

## 2019-07-24 ENCOUNTER — Telehealth: Payer: Self-pay | Admitting: Cardiology

## 2019-07-24 DIAGNOSIS — J309 Allergic rhinitis, unspecified: Secondary | ICD-10-CM | POA: Diagnosis not present

## 2019-07-24 DIAGNOSIS — D6869 Other thrombophilia: Secondary | ICD-10-CM | POA: Diagnosis not present

## 2019-07-24 DIAGNOSIS — I10 Essential (primary) hypertension: Secondary | ICD-10-CM | POA: Diagnosis not present

## 2019-07-24 DIAGNOSIS — R300 Dysuria: Secondary | ICD-10-CM | POA: Diagnosis not present

## 2019-07-24 DIAGNOSIS — Z1212 Encounter for screening for malignant neoplasm of rectum: Secondary | ICD-10-CM | POA: Diagnosis not present

## 2019-07-24 DIAGNOSIS — Z0000 Encounter for general adult medical examination without abnormal findings: Secondary | ICD-10-CM | POA: Diagnosis not present

## 2019-07-24 DIAGNOSIS — F3342 Major depressive disorder, recurrent, in full remission: Secondary | ICD-10-CM | POA: Diagnosis not present

## 2019-07-24 DIAGNOSIS — I480 Paroxysmal atrial fibrillation: Secondary | ICD-10-CM | POA: Diagnosis not present

## 2019-07-24 DIAGNOSIS — M810 Age-related osteoporosis without current pathological fracture: Secondary | ICD-10-CM | POA: Diagnosis not present

## 2019-07-24 DIAGNOSIS — E559 Vitamin D deficiency, unspecified: Secondary | ICD-10-CM | POA: Diagnosis not present

## 2019-07-24 DIAGNOSIS — E785 Hyperlipidemia, unspecified: Secondary | ICD-10-CM | POA: Diagnosis not present

## 2019-07-24 DIAGNOSIS — R7301 Impaired fasting glucose: Secondary | ICD-10-CM | POA: Diagnosis not present

## 2019-07-24 DIAGNOSIS — Z1331 Encounter for screening for depression: Secondary | ICD-10-CM | POA: Diagnosis not present

## 2019-07-24 NOTE — Telephone Encounter (Signed)
New Message     Pt is calling and says she was told she had high auric acid.  She says she had labs done 2 weeks ago and her physical today with her PC  And she said her PCP didn't see anything about her having high auric acid  She would like to discuss this with a nurse     Please call

## 2019-07-24 NOTE — Telephone Encounter (Signed)
Spoke to patient. She states someone called -about  Lab values -"uric acid"   RN reviewed cahrt - patient received a phone call in regards to  Xarelto being refilled whether patient needed  20 mg tablet or 15 mg tablet depending on  Lab value SCr. 90 supply of 20 mg Xarelto was sent  To pharmacy- until newer labs were drawn.  Patient states she had lab drawn  @ 10/27- will have Dr Manya Silvas office fax information( #number given and instructions) for pharmacist to review  Prior to the patient picking up the 90 day supply . Patient states she has 3 tablets left.  patient aware if she has not heard from the office by Thursday Nov 5,2020 call the office

## 2019-07-26 ENCOUNTER — Telehealth: Payer: Self-pay | Admitting: Pharmacist

## 2019-07-26 MED ORDER — RIVAROXABAN 20 MG PO TABS: 20.0000 mg | ORAL_TABLET | Freq: Every day | ORAL | 1 refills | Status: DC

## 2019-07-26 NOTE — Telephone Encounter (Signed)
We talked about Scr levels. We wanted a repeat blood work in 3 months (not needed at this time) but patient decided to repeat ASAP with PCP.  Scr still at 0.8 and appropriate for 90 day supply of Xarelto with refill.

## 2019-07-26 NOTE — Telephone Encounter (Signed)
Called informed the pt that the refill was sent and renal fx good the pt voiced understanding

## 2019-07-29 IMAGING — CT CT HEAD W/O CM
3 of 8 series · 12 of 47 positions shown, 14 images · non-contrast
Comparison: None.

CLINICAL DATA: Fall while walking. No head injury. Patient does
have neck pain.

EXAM:
CT HEAD WITHOUT CONTRAST
CT CERVICAL SPINE WITHOUT CONTRAST
TECHNIQUE: Multidetector CT imaging of the head and cervical spine was
performed following the standard protocol without intravenous
contrast. Multiplanar CT image reconstructions of the cervical spine
were also generated.

[Series 7: sagittal · sagittal · 0.30mm/px · 2 of 63 slices shown]
[im 21/63  brain]
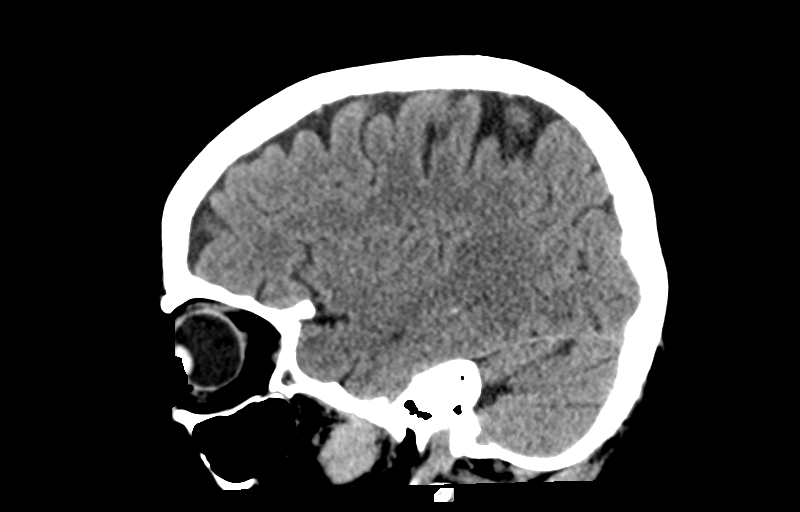
[im 42/63  brain]
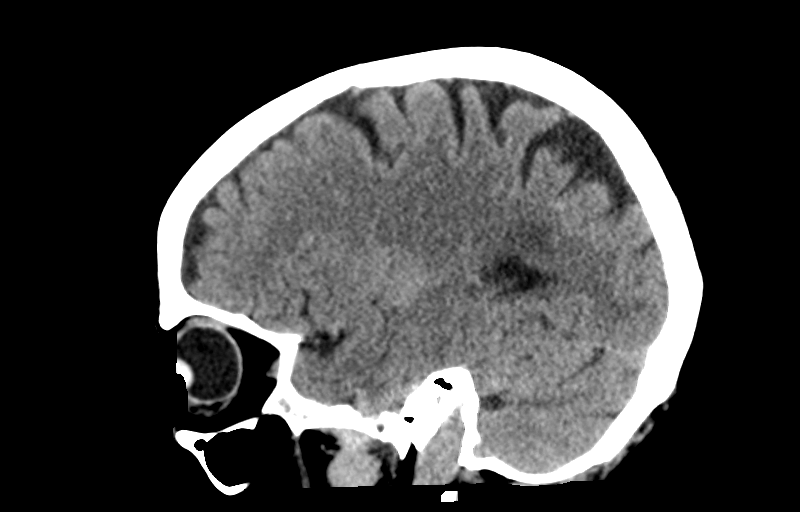

[Series 11: axial recon · axial · 0.24mm/px · z∈[-313,-145]mm · 7 of 111 slices shown, 9 images]
[im 11/111  brain]
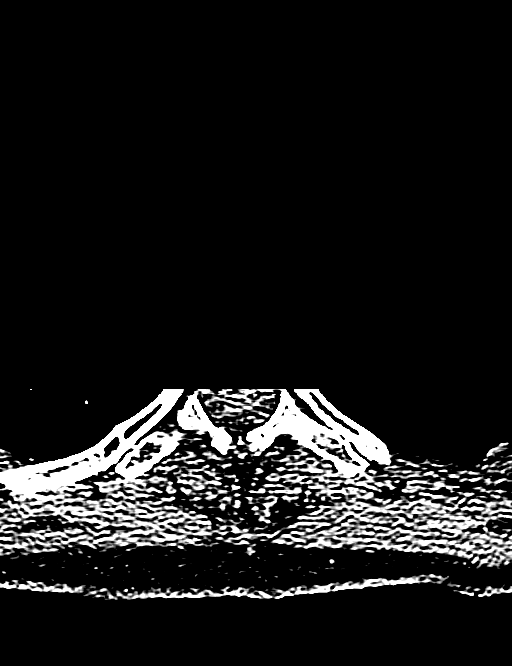
[im 11/111  bone]
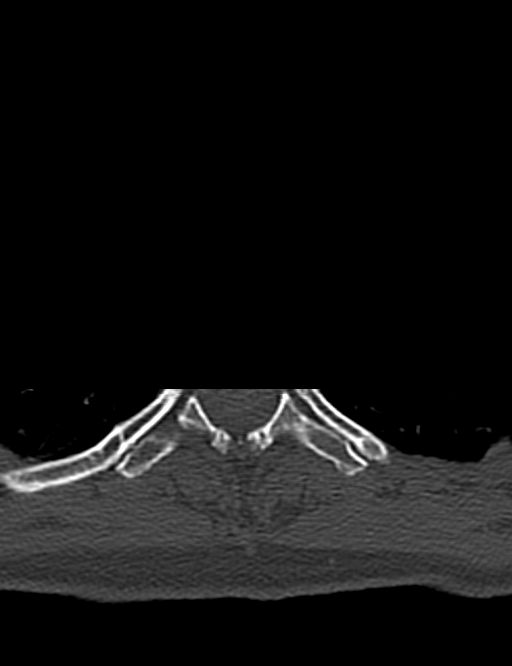
[im 31/111  brain]
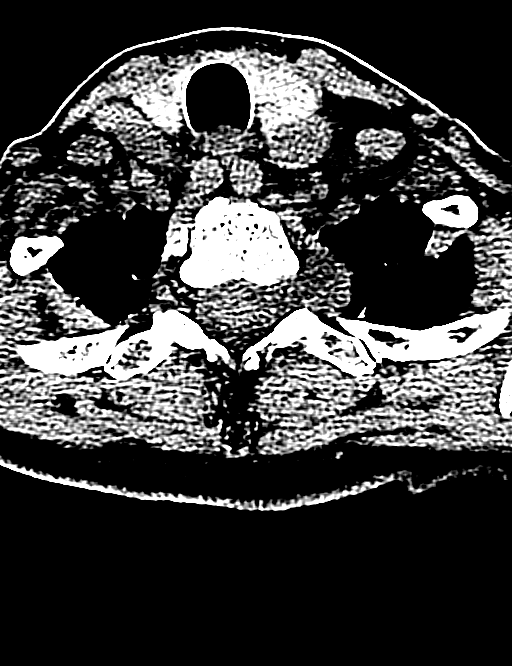
[im 41/111  brain]
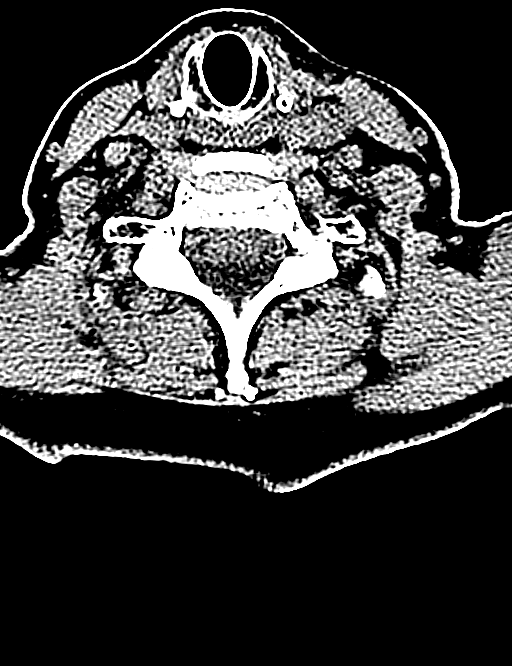
[im 61/111  brain]
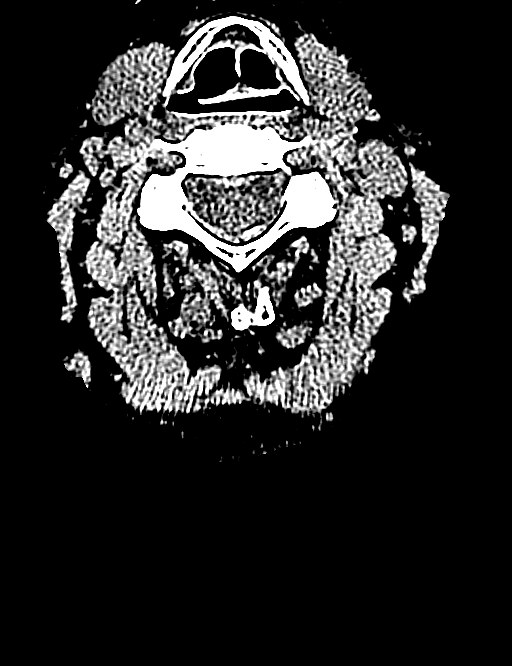
[im 71/111  brain]
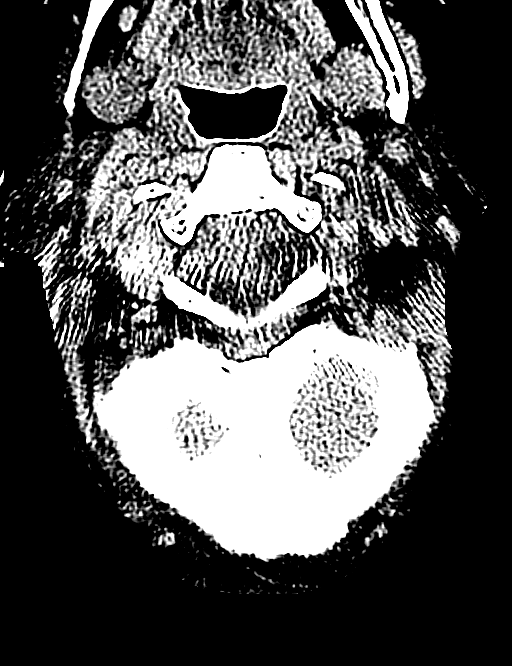
[im 71/111  bone]
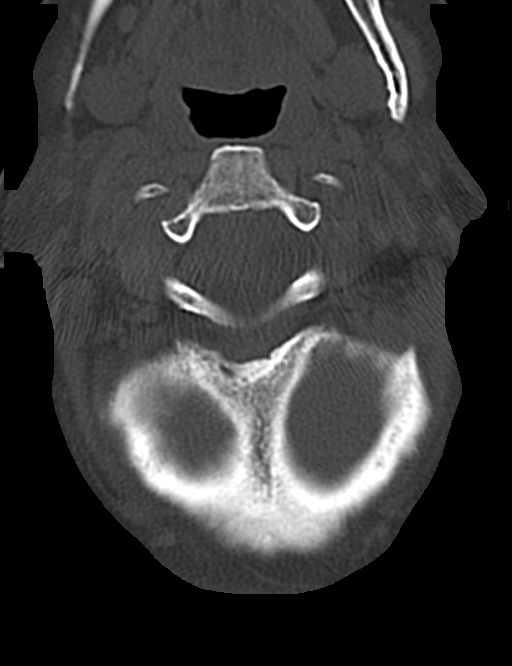
[im 81/111  brain]
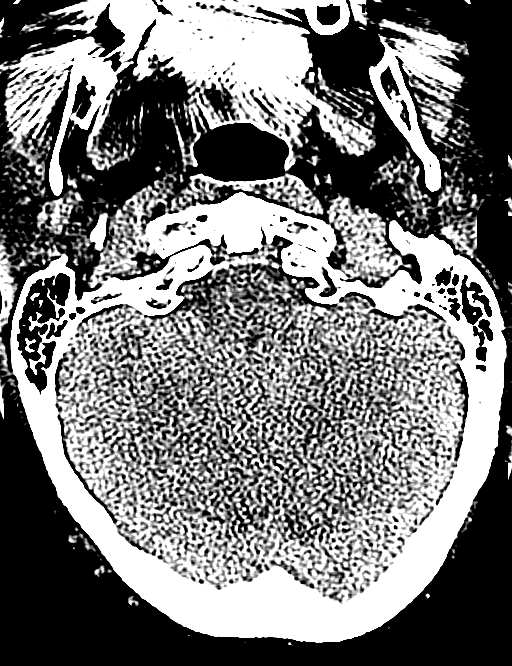
[im 101/111  brain]
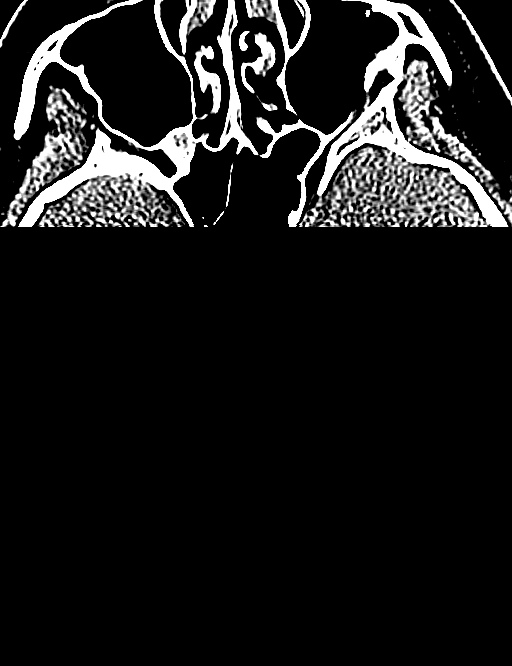

[Series 12: coronal · coronal · 0.23mm/px · 3 of 81 slices shown]
[im 44/81  brain]
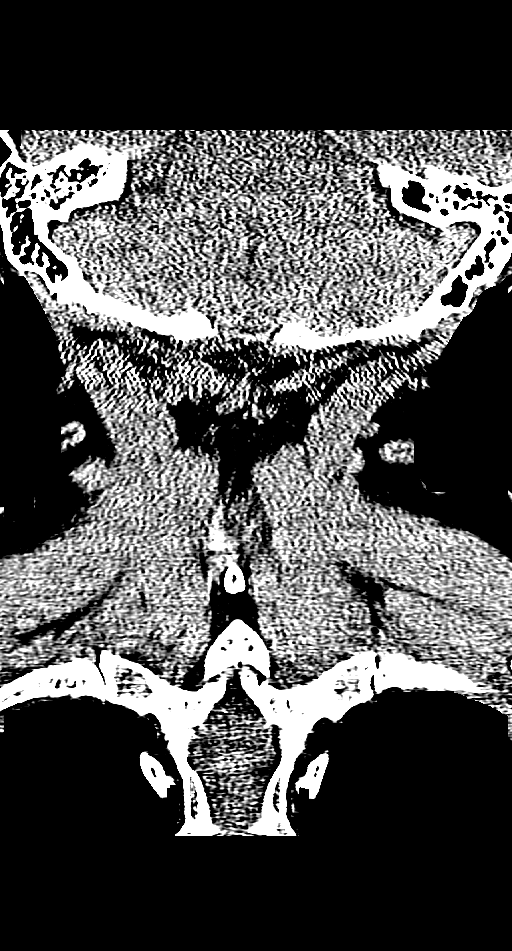
[im 51/81  brain]
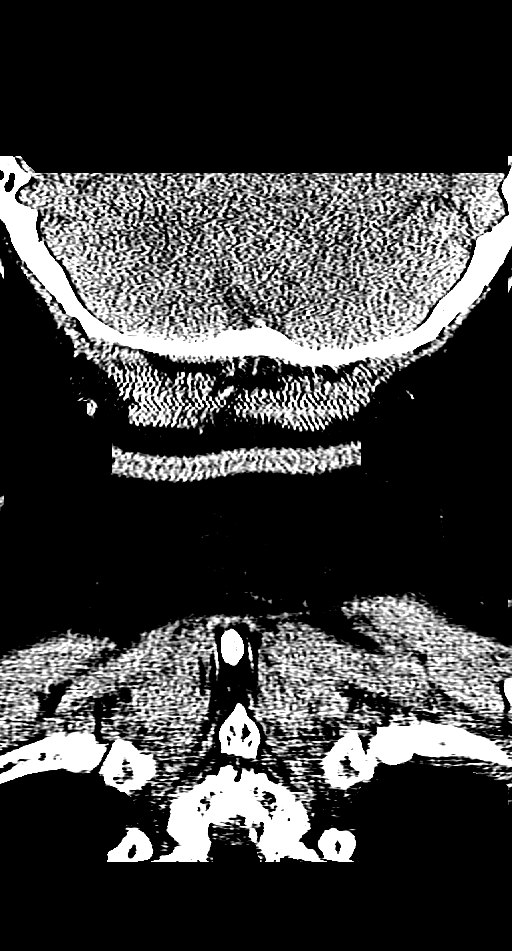
[im 59/81  brain]
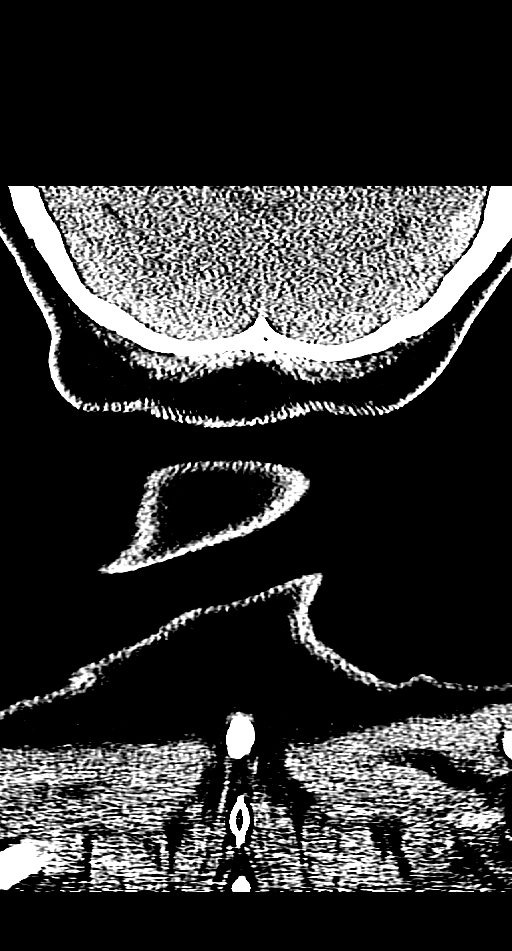

[12 of 47 positions shown; findings below may reference images not displayed]

FINDINGS: CT HEAD FINDINGS

Brain: No evidence of acute infarction, hemorrhage, hydrocephalus,
extra-axial collection or mass lesion/mass effect.

Vascular: No hyperdense vessel or unexpected calcification.

Skull: Normal. Negative for fracture or focal lesion.

Sinuses/Orbits: No acute finding.

Other: None.

CT CERVICAL SPINE FINDINGS

Alignment: Normal.

Skull base and vertebrae: No acute fracture. No primary bone lesion
or focal pathologic process. Mild spondylosis is present to include
facet arthropathy.

Soft tissues and spinal canal: No prevertebral fluid or swelling. No
visible canal hematoma.

Disc levels:  Within normal.

Upper chest: No acute findings.

Other: None.
IMPRESSION: No acute intracranial findings.

No acute cervical spine injury.

Mild spondylosis of the cervical spine.

## 2019-07-29 IMAGING — CR DG SHOULDER 2+V*L*
3 series · 3 of 3 positions shown · non-contrast
Comparison: None.

CLINICAL DATA: Fall today while walking landing on left shoulder.

EXAM:
LEFT SHOULDER - 2+ VIEW

[x shoulder ap left (1 of 3)]
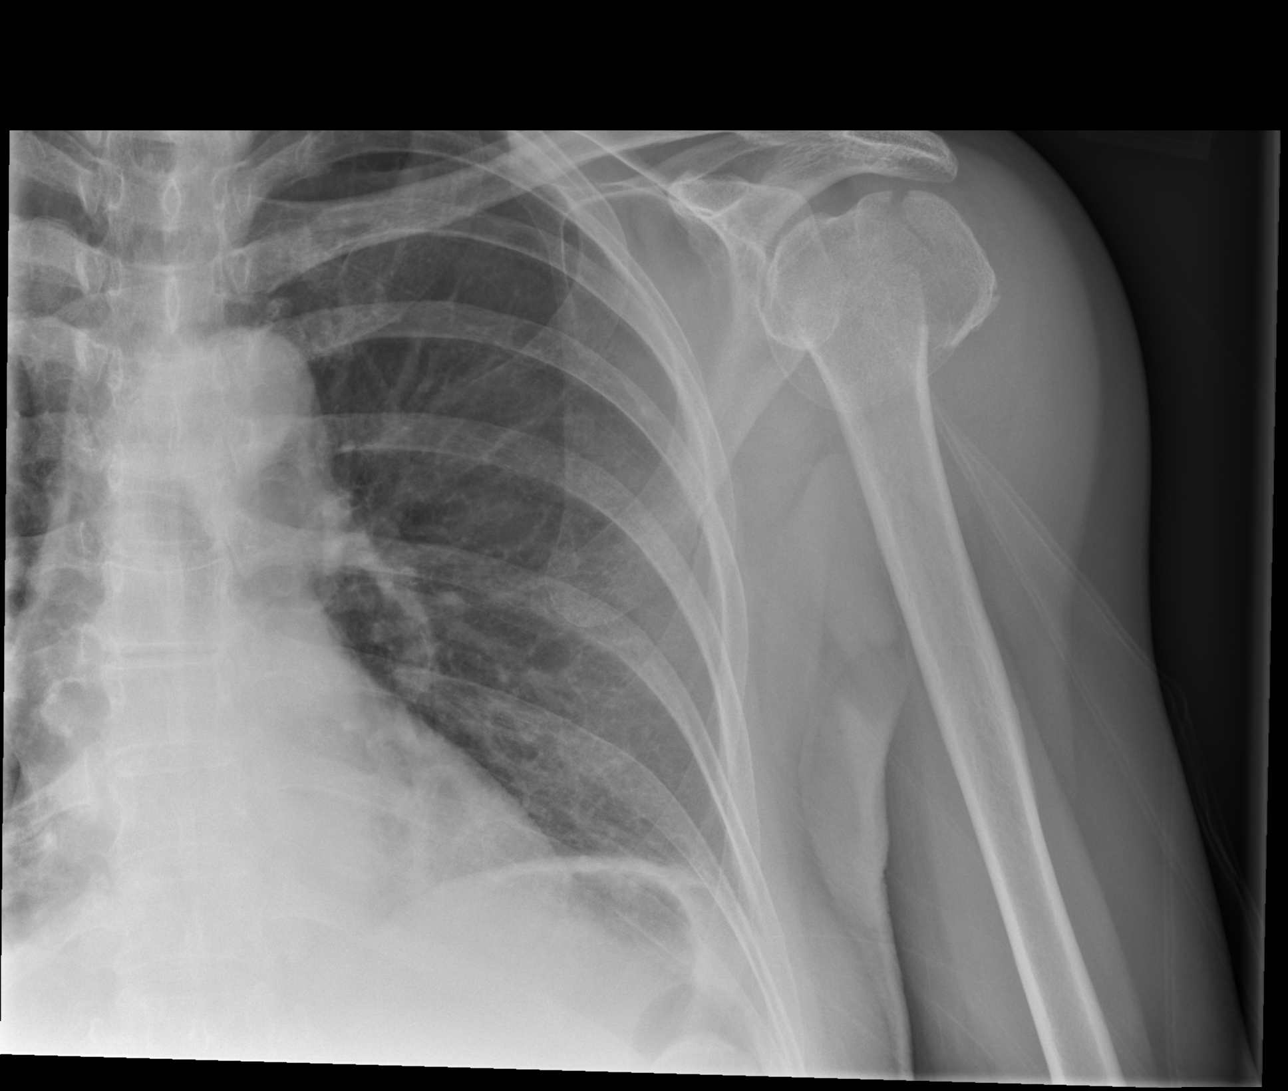

[x shoulder ap left (2 of 3)]
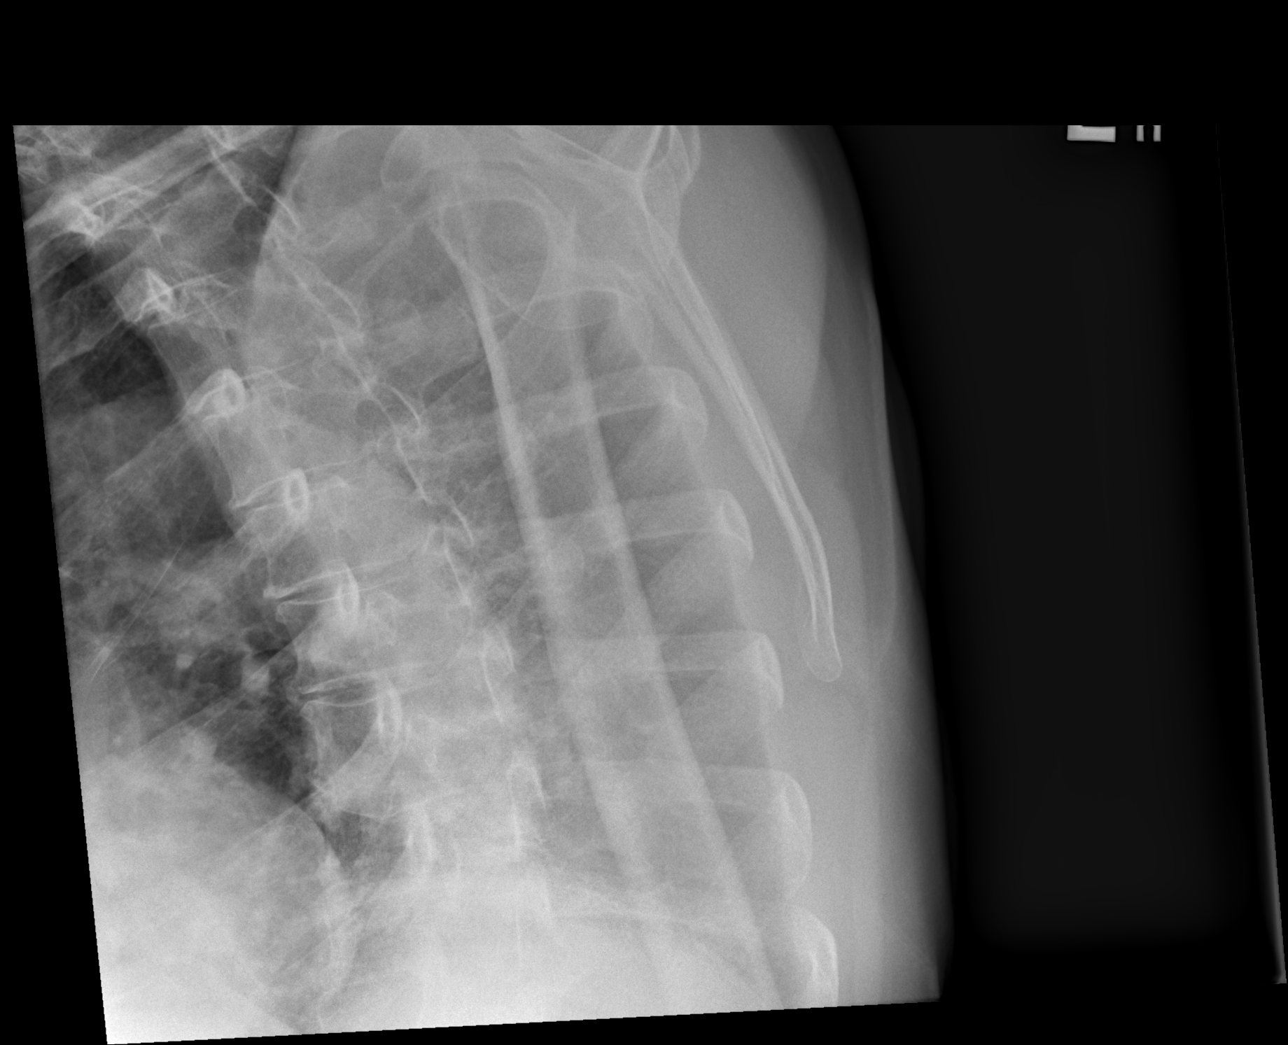

[x shoulder ap left (3 of 3)]
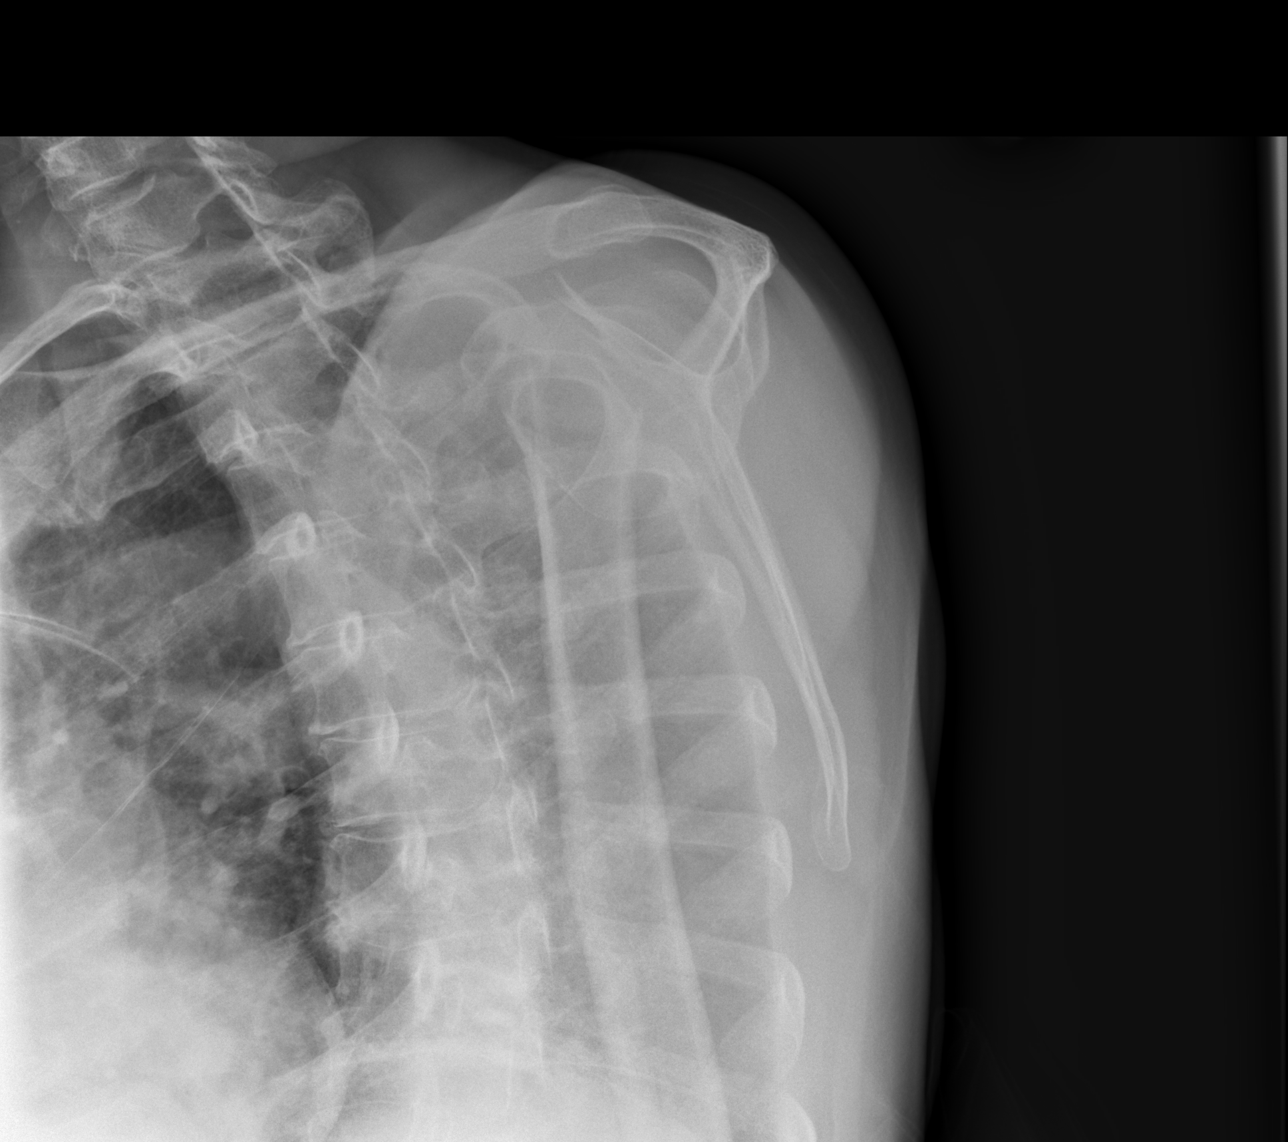

[3 of 3 positions shown; findings below may reference images not displayed]

FINDINGS: Examination demonstrates a displaced slightly comminuted humeral
neck fracture extending to the humeral head to the region of the
greater tuberosity. Remainder of the exam is unremarkable.
IMPRESSION: Displaced humeral neck fracture with mild comminution extending into
the humeral head.

## 2019-08-04 IMAGING — RF DG C-ARM 61-120 MIN
1 series · 2 of 2 positions shown · non-contrast
Comparison: 08/12/2017 shoulder radiographs

CLINICAL DATA: 71 y/o  F; ORIF of left proximal humerus fracture.

EXAM:
DG C-ARM 61-120 MIN; LEFT SHOULDER - 2+ VIEW

[Series 1: run · 2 of 2 slices shown]
[im 1/2]
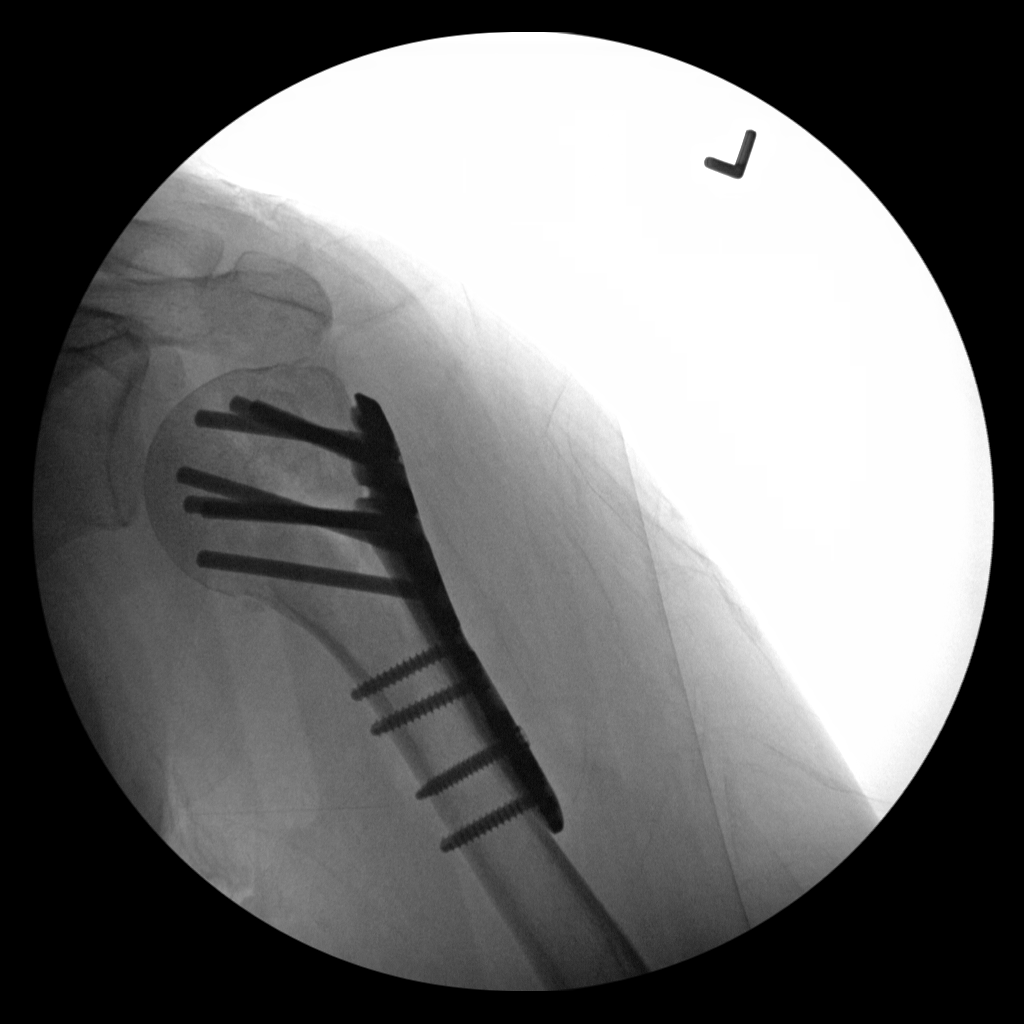
[im 2/2]
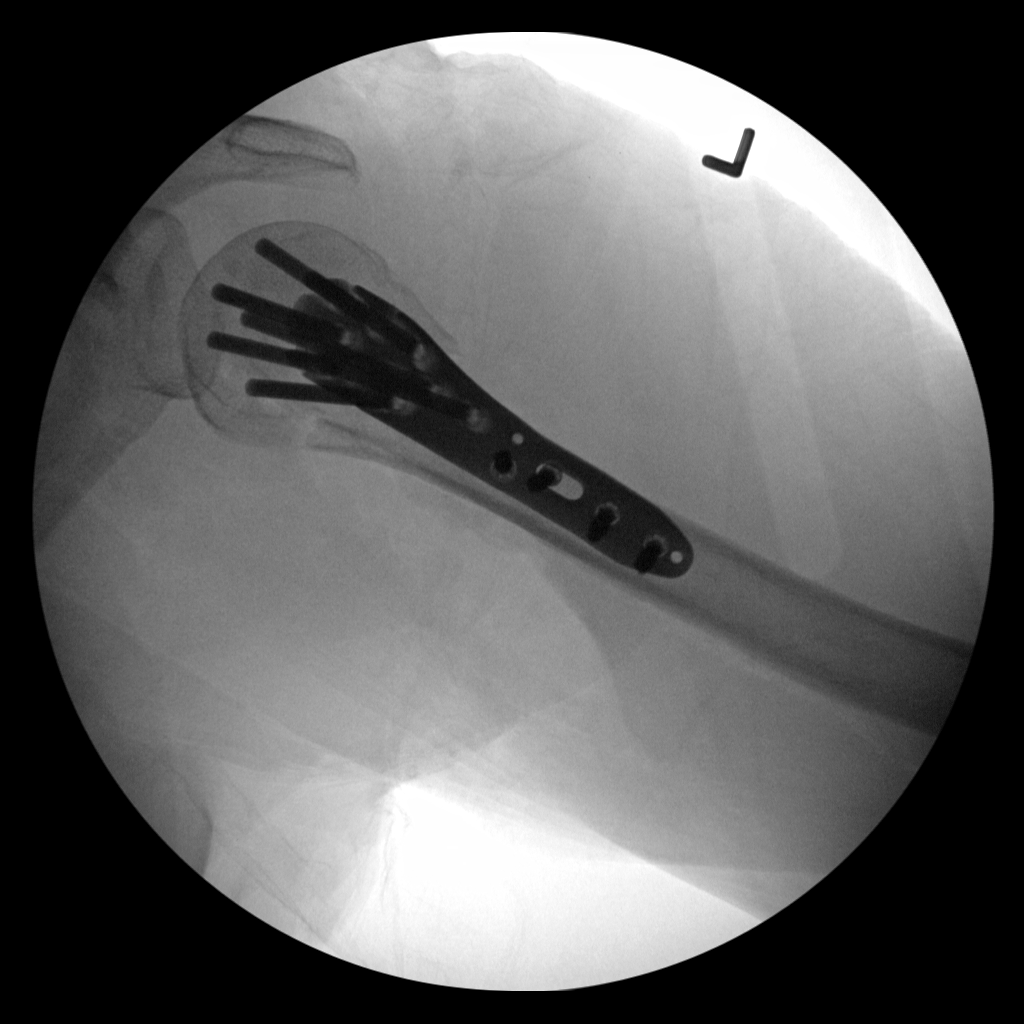

[2 of 2 positions shown; findings below may reference images not displayed]

FINDINGS: Fluoro time 21 seconds. 2 intraoperative fluoroscopic views of plate
fixation of proximal humerus fracture with near anatomic alignment
post fixation.
IMPRESSION: Intraoperative fluoroscopy of ORIF of left proximal humerus
fracture. Fluoro time is 21 seconds.

By: Mherita Oljira M.D.

## 2019-08-04 IMAGING — DX DG SHOULDER 1V*L*
1 series · 1 of 1 positions shown · non-contrast
Comparison: None.

CLINICAL DATA: 71 y/o  F; postop left proximal humerus fixation.

EXAM:
LEFT SHOULDER - 1 VIEW

[shoulder ap]
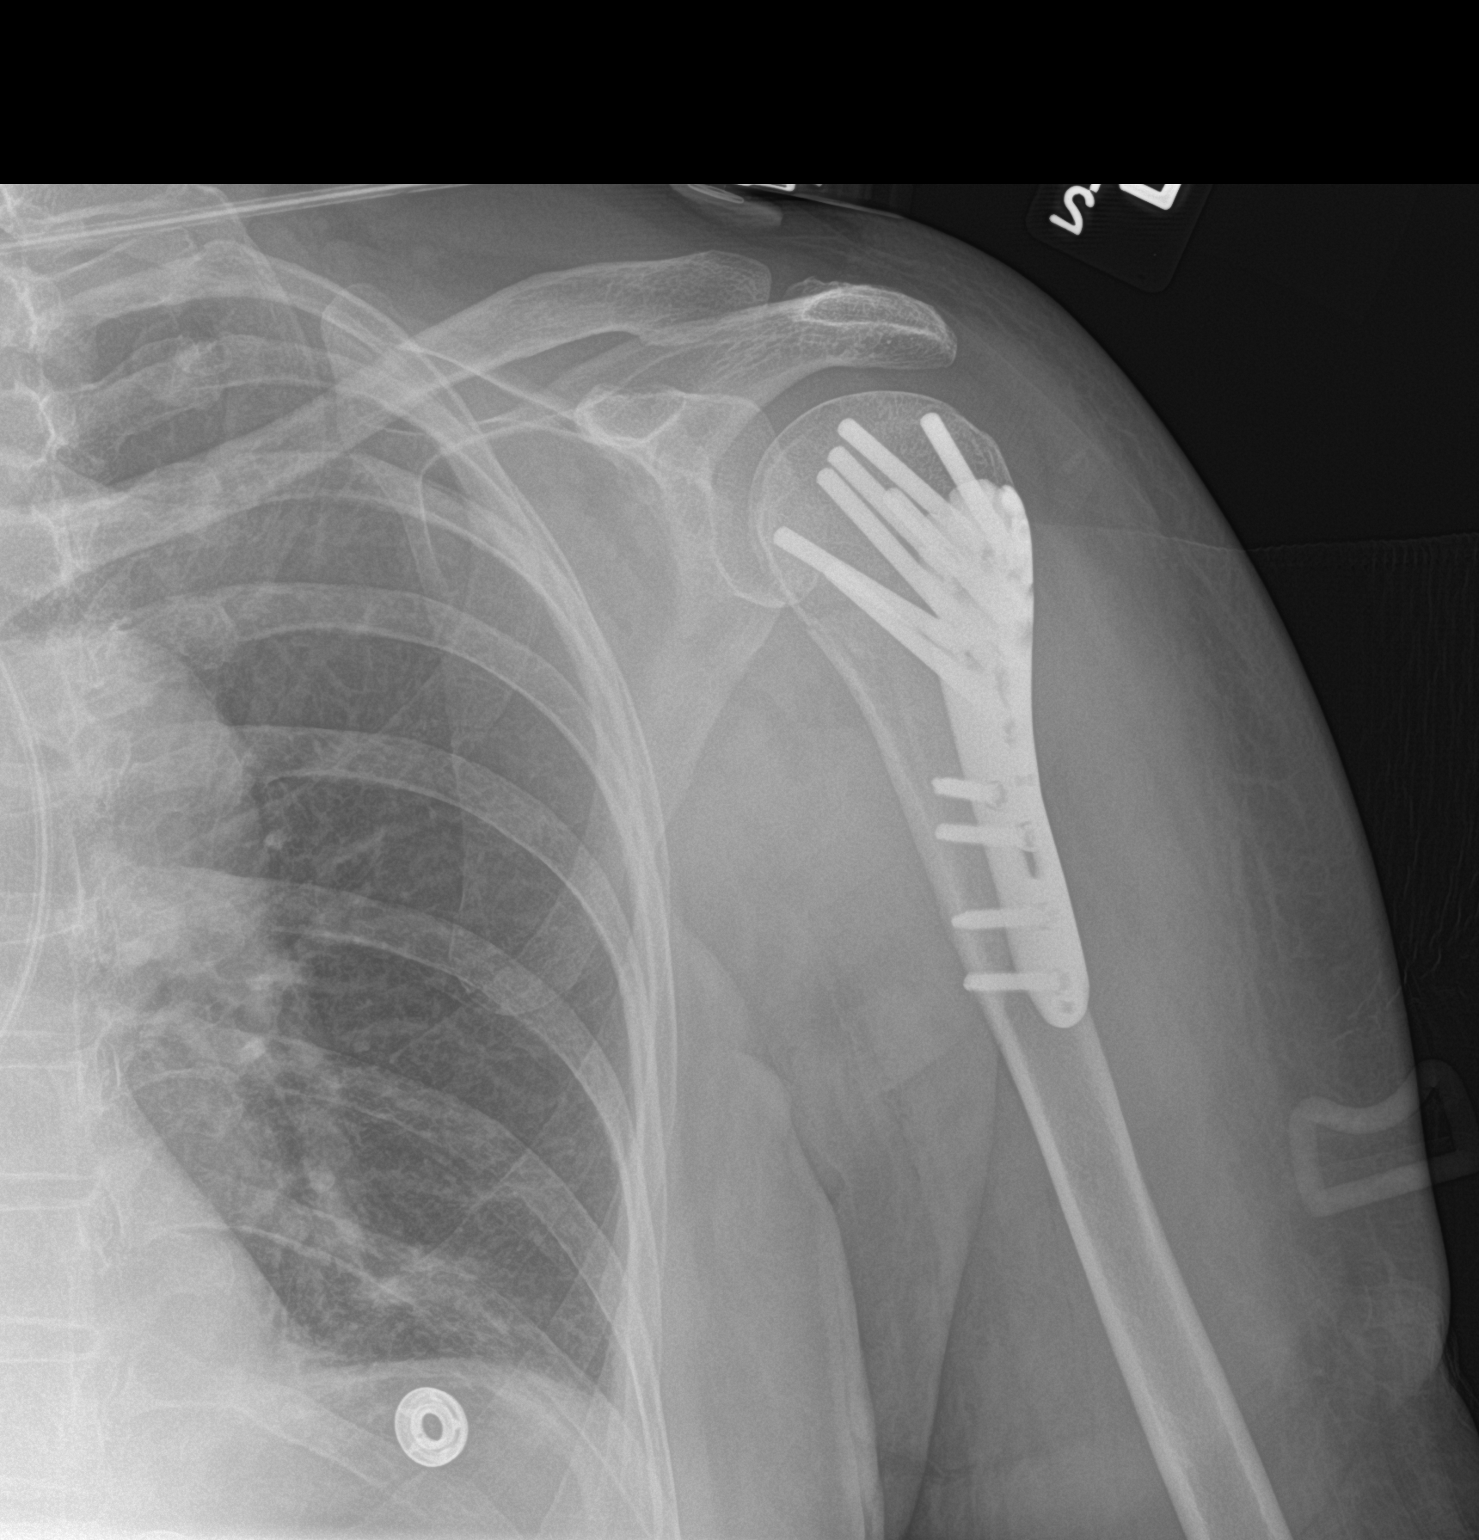

[1 of 1 positions shown; findings below may reference images not displayed]

FINDINGS: Single frontal view of the left shoulder demonstrates plate and
screw fixation of left proximal humerus fracture in anatomic
alignment. Glenohumeral joint is well maintained. No new fracture
identified. Normal acromioclavicular and coracoclavicular intervals.
Mild edema of the shoulder soft tissue from surgical changes.
IMPRESSION: Left proximal humerus fracture fixation without apparent hardware
related complication.

By: Denistina Henjak M.D.

## 2019-08-15 DIAGNOSIS — Z961 Presence of intraocular lens: Secondary | ICD-10-CM | POA: Diagnosis not present

## 2019-08-27 DIAGNOSIS — H1033 Unspecified acute conjunctivitis, bilateral: Secondary | ICD-10-CM | POA: Diagnosis not present

## 2019-09-10 DIAGNOSIS — N952 Postmenopausal atrophic vaginitis: Secondary | ICD-10-CM | POA: Diagnosis not present

## 2019-09-10 DIAGNOSIS — Z6824 Body mass index (BMI) 24.0-24.9, adult: Secondary | ICD-10-CM | POA: Diagnosis not present

## 2019-09-10 DIAGNOSIS — Z1231 Encounter for screening mammogram for malignant neoplasm of breast: Secondary | ICD-10-CM | POA: Diagnosis not present

## 2019-09-10 DIAGNOSIS — Z01419 Encounter for gynecological examination (general) (routine) without abnormal findings: Secondary | ICD-10-CM | POA: Diagnosis not present

## 2019-09-11 ENCOUNTER — Telehealth: Payer: Self-pay | Admitting: Cardiology

## 2019-09-11 NOTE — Telephone Encounter (Signed)
Pt c/o BP issue: STAT if pt c/o blurred vision, one-sided weakness or slurred speech  1. What are your last 5 BP readings?  She doesn't have them. She went to her OB-GYN yesterday, and it was high.   2. Are you having any other symptoms (ex. Dizziness, headache, blurred vision, passed out)?   3. What is your BP issue? BP seems to be going up in the afternoons. She states she has a BP cuff that she checks her BP and it told her that she was pre-hypertensive. She doesn't know how to check the memory on her BP machine to get the readings and she did not write them down.

## 2019-09-11 NOTE — Telephone Encounter (Signed)
Spoke with pt, her bp at the [facility] office was 132/78 and she has noticed in the afternoon and early evening her bp will elevate, she can not give me numbers. This morning her bp was 117/85 and 123/75.she is taking her diltiazem around 6:30 in the evening as that is when her bp will start to elevate. She also reports going in and out of atrial fib. Reassurance given to the patient that the bp this morning was fine and going in and out of atrial fib is common. She is covered by taking her xarelto daily. She will monitor her bp and pulse for the next week and call back with those numbers. She is aware the goal for her bp is 130/85. Pt agreed with this plan.

## 2019-10-25 DIAGNOSIS — M810 Age-related osteoporosis without current pathological fracture: Secondary | ICD-10-CM | POA: Diagnosis not present

## 2019-11-05 ENCOUNTER — Ambulatory Visit: Payer: PPO | Attending: Internal Medicine

## 2019-11-19 DIAGNOSIS — R1310 Dysphagia, unspecified: Secondary | ICD-10-CM | POA: Diagnosis not present

## 2019-11-19 DIAGNOSIS — Z1211 Encounter for screening for malignant neoplasm of colon: Secondary | ICD-10-CM | POA: Diagnosis not present

## 2019-11-19 DIAGNOSIS — K2970 Gastritis, unspecified, without bleeding: Secondary | ICD-10-CM | POA: Diagnosis not present

## 2019-11-19 DIAGNOSIS — K219 Gastro-esophageal reflux disease without esophagitis: Secondary | ICD-10-CM | POA: Diagnosis not present

## 2019-11-22 DIAGNOSIS — M810 Age-related osteoporosis without current pathological fracture: Secondary | ICD-10-CM | POA: Diagnosis not present

## 2019-11-22 DIAGNOSIS — J3489 Other specified disorders of nose and nasal sinuses: Secondary | ICD-10-CM | POA: Diagnosis not present

## 2019-11-22 DIAGNOSIS — E559 Vitamin D deficiency, unspecified: Secondary | ICD-10-CM | POA: Diagnosis not present

## 2019-12-12 ENCOUNTER — Other Ambulatory Visit (HOSPITAL_COMMUNITY): Payer: Self-pay | Admitting: *Deleted

## 2019-12-13 ENCOUNTER — Other Ambulatory Visit: Payer: Self-pay

## 2019-12-13 ENCOUNTER — Ambulatory Visit (HOSPITAL_COMMUNITY)
Admission: RE | Admit: 2019-12-13 | Discharge: 2019-12-13 | Disposition: A | Discharge status: Home / Self Care | Payer: PPO | Source: Ambulatory Visit | Attending: Internal Medicine | Admitting: Internal Medicine

## 2019-12-13 DIAGNOSIS — M810 Age-related osteoporosis without current pathological fracture: Secondary | ICD-10-CM | POA: Insufficient documentation

## 2019-12-13 MED ORDER — ZOLEDRONIC ACID 5 MG/100ML IV SOLN
INTRAVENOUS | Status: AC
  Administered 2019-12-13: 5 mg via INTRAVENOUS
  Filled 2019-12-13: qty 100

## 2019-12-13 MED ORDER — ZOLEDRONIC ACID 5 MG/100ML IV SOLN: 5.0000 mg | Freq: Once | INTRAVENOUS | Status: AC

## 2019-12-18 DIAGNOSIS — L218 Other seborrheic dermatitis: Secondary | ICD-10-CM | POA: Diagnosis not present

## 2019-12-18 DIAGNOSIS — L718 Other rosacea: Secondary | ICD-10-CM | POA: Diagnosis not present

## 2019-12-18 DIAGNOSIS — L738 Other specified follicular disorders: Secondary | ICD-10-CM | POA: Diagnosis not present

## 2019-12-18 DIAGNOSIS — Z85820 Personal history of malignant melanoma of skin: Secondary | ICD-10-CM | POA: Diagnosis not present

## 2020-01-17 ENCOUNTER — Ambulatory Visit (HOSPITAL_COMMUNITY): Payer: PPO

## 2020-02-07 ENCOUNTER — Other Ambulatory Visit: Payer: Self-pay | Admitting: Pharmacist

## 2020-02-07 MED ORDER — RIVAROXABAN 20 MG PO TABS: 20.0000 mg | ORAL_TABLET | Freq: Every day | ORAL | 1 refills | Status: DC

## 2020-03-28 DIAGNOSIS — D1800 Hemangioma unspecified site: Secondary | ICD-10-CM | POA: Diagnosis not present

## 2020-04-10 ENCOUNTER — Telehealth: Payer: Self-pay | Admitting: Orthopaedic Surgery

## 2020-04-10 NOTE — Telephone Encounter (Signed)
Can you please call pt and offer appt with Artis Delay he has openings on 8/4//21. thanks

## 2020-04-10 NOTE — Telephone Encounter (Signed)
Spoke with the patient . She rejected the earlier date and will stick with her appointment on 04/28/2020

## 2020-04-10 NOTE — Telephone Encounter (Signed)
Patient called  She is wanting to know if she can be seen sooner than the next available. She has seen Dr.Blackman but if was several years ago and it's her left ankle that's hurting.   Call back: [phone]

## 2020-04-28 ENCOUNTER — Encounter: Payer: Self-pay | Admitting: Orthopaedic Surgery

## 2020-04-28 ENCOUNTER — Ambulatory Visit (INDEPENDENT_AMBULATORY_CARE_PROVIDER_SITE_OTHER): Payer: PPO

## 2020-04-28 ENCOUNTER — Ambulatory Visit: Payer: PPO | Admitting: Orthopaedic Surgery

## 2020-04-28 VITALS — Ht 59.0 in | Wt 120.0 lb

## 2020-04-28 DIAGNOSIS — M25572 Pain in left ankle and joints of left foot: Secondary | ICD-10-CM

## 2020-04-28 NOTE — Progress Notes (Signed)
Office Visit Note   Patient: Alyssa Martin           Date of Birth: [birth year]           MRN: [id number] Visit Date: 04/28/2020              Requested by: Marton Redwood, MD [address] [place],  [place] [address] PCP: Marton Redwood, MD   Assessment & Plan: Visit Diagnoses:  1. Pain in left ankle and joints of left foot     Plan: I gave her reassurance that her ankle looks good overall.  I do feel that she would benefit from an ASO due to the sprain that she has and this can help stability the ankle as she is working on her balance and coordination and strengthening.  She does have a history of being fitted for bilateral shoe inserts and she would like a new prescription for [employer] to have new inserts made for her and I agree with this.  All questions and concerns were answered and addressed.  Follow-up can be as needed.  Follow-Up Instructions: Return if symptoms worsen or fail to improve.   Orders:  Orders Placed This Encounter  Procedures  . XR Ankle Complete Left   No orders of the defined types were placed in this encounter.     Procedures: No procedures performed   Clinical Data: No additional findings.   Subjective: No chief complaint on file. The patient comes in with a chief complaint of left ankle pain.  She actually sprained this ankle when she twisted in the garden about 3 weeks ago.  She is 75 years out from surgical fixation of a bimalleolar ankle fracture dislocation.  She is 74 years old.  She has a private physical therapist is going work with her symptoms just balance and coordination.  She is pointing to the lateral aspect of her ankle and foot is a source of her pain.  She has had no other significant issues prior to that other than a fall in 2018 or 19 where she fractured her proximal humerus on the left side.  Her biggest fear is becoming immobile and having more falls.  She wants to therapist to have them work on balance and  coordination.  HPI  Review of Systems She currently denies any headache, chest pain, shortness of breath, fever, chills, nausea, vomiting  Objective: Vital Signs: Ht 4\' 11"  (1.499 m)   Wt 120 lb (54.4 kg)   BMI 24.24 kg/m   Physical Exam She is alert and orient x3 and in no acute distress Ortho Exam Examination of her left operative ankle shows medial and lateral incisions that are healed nicely.  She has full range of motion of the ankle.  There is only pain over the anterior talofibular ligament area. Specialty Comments:  No specialty comments available.  Imaging: XR Ankle Complete Left  Result Date: 04/28/2020 3 views of the left ankle show no acute findings.  There is previous hardware from fixation of a bimalleolar ankle fracture.  The fracture is healed.  There is some mild medial arthritic changes.  The remainder of the joint is congruent.    PMFS History: Patient Active Problem List   Diagnosis Date Noted  . Shoulder fracture, left 08/18/2017  . Paroxysmal A-fib ([facility]) 09/08/2012   Past Medical History:  Diagnosis Date  . Arthritis   . Closed fracture of left proximal humerus   . Closed right ankle fracture   . Depression with  anxiety   . Elevated BP   . GERD (gastroesophageal reflux disease)   . Hyperlipidemia   . Hypertension   . Impaired fasting glucose   . Osteopenia   . Osteoporosis   . Paroxysmal atrial fibrillation (HCC)   . Vitamin D deficiency   . Wears glasses     Family History  Problem Relation Age of Onset  . Hypertension Mother   . Osteoarthritis Mother   . Bone cancer Father   . Osteopenia Father   . Lung cancer Brother   . Lung cancer Paternal Grandmother   . Leukemia Paternal Grandfather   . Bone cancer Brother     Past Surgical History:  Procedure Laterality Date  . COLONOSCOPY  05/10/2011  . DIAGNOSTIC MAMMOGRAM  10 /09/2010  . ORIF HUMERUS FRACTURE Left 08/18/2017   Procedure: OPEN REDUCTION INTERNAL FIXATION (ORIF) LEFT  PROXIMAL HUMERUS FRACTURE;  Surgeon: Meredith Pel, MD;  Location: [facility];  Service: Orthopedics;  Laterality: Left;  . TIBIA FRACTURE SURGERY  04/2006   Left--From falling down the stairs  . TIBIA FRACTURE SURGERY     In her 11's   Social History   Occupational History  . Occupation: teacher-retired  Tobacco Use  . Smoking status: Never Smoker  . Smokeless tobacco: Never Used  Vaping Use  . Vaping Use: Never used  Substance and Sexual Activity  . Alcohol use: No    Alcohol/week: 0.0 standard drinks  . Drug use: No  . Sexual activity: Not on file

## 2020-05-29 DIAGNOSIS — D225 Melanocytic nevi of trunk: Secondary | ICD-10-CM | POA: Diagnosis not present

## 2020-05-29 DIAGNOSIS — D2272 Melanocytic nevi of left lower limb, including hip: Secondary | ICD-10-CM | POA: Diagnosis not present

## 2020-05-29 DIAGNOSIS — Z85820 Personal history of malignant melanoma of skin: Secondary | ICD-10-CM | POA: Diagnosis not present

## 2020-05-29 DIAGNOSIS — D1801 Hemangioma of skin and subcutaneous tissue: Secondary | ICD-10-CM | POA: Diagnosis not present

## 2020-05-29 DIAGNOSIS — D485 Neoplasm of uncertain behavior of skin: Secondary | ICD-10-CM | POA: Diagnosis not present

## 2020-05-29 DIAGNOSIS — L821 Other seborrheic keratosis: Secondary | ICD-10-CM | POA: Diagnosis not present

## 2020-06-16 NOTE — Progress Notes (Signed)
Cardiology Office Note   Date:  06/20/2020   ID:  Alyssa Martin, Alyssa Martin July 26, 1946, MRN [id number]  PCP:  Marton Redwood, MD  Cardiologist:   Joahan Swatzell Martinique, MD   Chief Complaint  Patient presents with   Atrial Fibrillation      History of Present Illness: Alyssa Martin is a 74 y.o. female who presents for follow up Afib. She has a history of paroxysmal atrial fibrillation dating back to 2013. She has been on cardizem and Xarelto. She reports a few times where her heart beat faster but not clearly Afib. No dizziness, tachycardia, chest pain or SOB.    Past Medical History:  Diagnosis Date   Arthritis    Closed fracture of left proximal humerus    Closed right ankle fracture    Depression with anxiety    Elevated BP    GERD (gastroesophageal reflux disease)    Hyperlipidemia    Hypertension    Impaired fasting glucose    Osteopenia    Osteoporosis    Paroxysmal atrial fibrillation (HCC)    Vitamin D deficiency    Wears glasses     Past Surgical History:  Procedure Laterality Date   COLONOSCOPY  05/10/2011   DIAGNOSTIC MAMMOGRAM  10 /09/2010   ORIF HUMERUS FRACTURE Left 08/18/2017   Procedure: OPEN REDUCTION INTERNAL FIXATION (ORIF) LEFT PROXIMAL HUMERUS FRACTURE;  Surgeon: Meredith Pel, MD;  Location: [facility];  Service: Orthopedics;  Laterality: Left;   TIBIA FRACTURE SURGERY  04/2006   Left--From falling down the stairs   TIBIA FRACTURE SURGERY     In her 40's     Current Outpatient Medications  Medication Sig Dispense Refill   Calcium Citrate (CITRACAL PO) Take 630 mg by mouth 2 (two) times daily.      diltiazem (DILT-XR) 180 MG 24 hr capsule Take 1 capsule (180 mg total) by mouth daily. 90 capsule 3   ergocalciferol (VITAMIN D2) 50000 UNITS capsule Take 50,000 Units by mouth every 14 (fourteen) days.     famotidine (PEPCID) 20 MG tablet Take 20 mg by mouth as needed for heartburn or indigestion.     loratadine (CLARITIN) 10  MG tablet Take 10 mg by mouth daily as needed for allergies.      mometasone (NASONEX) 50 MCG/ACT nasal spray Place 2 sprays into the nose daily as needed (allergies).      Multiple Vitamin (MULTIVITAMIN) tablet Take 1 tablet by mouth daily.     rivaroxaban (XARELTO) 20 MG TABS tablet Take 1 tablet (20 mg total) by mouth daily with supper. 90 tablet 1   simvastatin (ZOCOR) 20 MG tablet Take 20 mg by mouth every evening.     vitamin C (ASCORBIC ACID) 500 MG tablet Take 500 mg by mouth daily.     zoledronic acid (RECLAST) 5 MG/100ML SOLN injection Inject 5 mg into the vein once. Yearly     No current facility-administered medications for this visit.    Allergies:   Penicillins and Ativan [lorazepam]    Social History:  The patient  reports that she has never smoked. She has never used smokeless tobacco. She reports that she does not drink alcohol and does not use drugs.   Family History:  The patient's family history includes Bone cancer in her brother and father; Hypertension in her mother; Leukemia in her paternal grandfather; Lung cancer in her brother and paternal grandmother; Osteoarthritis in her mother; Osteopenia in her father.    ROS:  Please  see the history of present illness.   Otherwise, review of systems are positive for none.   All other systems are reviewed and negative.    PHYSICAL EXAM: VS:  BP 138/68    Pulse 69    Ht 4\' 11"  (1.499 m)    Wt 125 lb 12.8 oz (57.1 kg)    SpO2 99%    BMI 25.41 kg/m  , BMI Body mass index is 25.41 kg/m. GEN: Well nourished, well developed, in no acute distress  HEENT: normal  Neck: no JVD, carotid bruits, or masses Cardiac: IRRR; no murmurs, rubs, or gallops,no edema  Respiratory:  clear to auscultation bilaterally, normal work of breathing GI: soft, nontender, nondistended, + BS MS: no deformity or atrophy  Skin: warm and dry, no rash Neuro:  Strength and sensation are intact Psych: euthymic mood, full affect   EKG:  EKG is  ordered today. The ekg ordered today demonstrates NSR, normal Ecg. I have personally reviewed and interpreted this study.    Recent Labs: No results found for requested labs within last 8760 hours.    Lipid Panel No results found for: CHOL, TRIG, HDL, CHOLHDL, VLDL, LDLCALC, LDLDIRECT   Dated 07/17/19: cholesterol 161, triglycerides 83, HDL 65, LDL79. A1c 5.5%. normal CBC and TSH Dated 11/22/19: normal CMET.  Wt Readings from Last 3 Encounters:  06/20/20 125 lb 12.8 oz (57.1 kg)  04/28/20 120 lb (54.4 kg)  12/13/19 120 lb (54.4 kg)      ASSESSMENT AND PLAN:  1. Paroxysmal atrial fibrillation. Currently asymptomatic. Good rate control on diltiazem.On Xarelto for anticoagulation.     Current medicines are reviewed at length with the patient today.  The patient does not have concerns regarding medicines.  The following changes have been made:  no change  Labs/ tests ordered today include:   Orders Placed This Encounter  Procedures   EKG 12-Lead     Disposition:   FU with me in 1 year  Signed, Fumio Vandam Martinique, MD  06/20/2020 11:46 AM    [facility] [address] [place], [place], [address] Phone [phone], Fax [phone]

## 2020-06-20 ENCOUNTER — Ambulatory Visit (INDEPENDENT_AMBULATORY_CARE_PROVIDER_SITE_OTHER): Payer: PPO | Admitting: Cardiology

## 2020-06-20 ENCOUNTER — Other Ambulatory Visit: Payer: Self-pay

## 2020-06-20 ENCOUNTER — Encounter: Payer: Self-pay | Admitting: Cardiology

## 2020-06-20 VITALS — BP 138/68 | HR 69 | Ht 59.0 in | Wt 125.8 lb

## 2020-06-20 DIAGNOSIS — I480 Paroxysmal atrial fibrillation: Secondary | ICD-10-CM | POA: Diagnosis not present

## 2020-06-20 MED ORDER — DILTIAZEM HCL ER 180 MG PO CP24: 180.0000 mg | ORAL_CAPSULE | Freq: Every day | ORAL | 3 refills | Status: DC

## 2020-07-12 DIAGNOSIS — Z23 Encounter for immunization: Secondary | ICD-10-CM | POA: Diagnosis not present

## 2020-07-15 DIAGNOSIS — H02403 Unspecified ptosis of bilateral eyelids: Secondary | ICD-10-CM | POA: Diagnosis not present

## 2020-07-19 ENCOUNTER — Other Ambulatory Visit: Payer: Self-pay | Admitting: Cardiology

## 2020-07-23 DIAGNOSIS — H524 Presbyopia: Secondary | ICD-10-CM | POA: Diagnosis not present

## 2020-07-23 DIAGNOSIS — R7301 Impaired fasting glucose: Secondary | ICD-10-CM | POA: Diagnosis not present

## 2020-07-23 DIAGNOSIS — E785 Hyperlipidemia, unspecified: Secondary | ICD-10-CM | POA: Diagnosis not present

## 2020-07-23 DIAGNOSIS — Z961 Presence of intraocular lens: Secondary | ICD-10-CM | POA: Diagnosis not present

## 2020-07-23 DIAGNOSIS — E559 Vitamin D deficiency, unspecified: Secondary | ICD-10-CM | POA: Diagnosis not present

## 2020-07-28 DIAGNOSIS — Z7901 Long term (current) use of anticoagulants: Secondary | ICD-10-CM | POA: Diagnosis not present

## 2020-07-28 DIAGNOSIS — D6869 Other thrombophilia: Secondary | ICD-10-CM | POA: Diagnosis not present

## 2020-07-28 DIAGNOSIS — E785 Hyperlipidemia, unspecified: Secondary | ICD-10-CM | POA: Diagnosis not present

## 2020-07-28 DIAGNOSIS — F3342 Major depressive disorder, recurrent, in full remission: Secondary | ICD-10-CM | POA: Diagnosis not present

## 2020-07-28 DIAGNOSIS — R7301 Impaired fasting glucose: Secondary | ICD-10-CM | POA: Diagnosis not present

## 2020-07-28 DIAGNOSIS — Z0000 Encounter for general adult medical examination without abnormal findings: Secondary | ICD-10-CM | POA: Diagnosis not present

## 2020-07-28 DIAGNOSIS — I10 Essential (primary) hypertension: Secondary | ICD-10-CM | POA: Diagnosis not present

## 2020-07-28 DIAGNOSIS — E559 Vitamin D deficiency, unspecified: Secondary | ICD-10-CM | POA: Diagnosis not present

## 2020-07-28 DIAGNOSIS — Z1339 Encounter for screening examination for other mental health and behavioral disorders: Secondary | ICD-10-CM | POA: Diagnosis not present

## 2020-07-28 DIAGNOSIS — M810 Age-related osteoporosis without current pathological fracture: Secondary | ICD-10-CM | POA: Diagnosis not present

## 2020-07-28 DIAGNOSIS — I480 Paroxysmal atrial fibrillation: Secondary | ICD-10-CM | POA: Diagnosis not present

## 2020-07-28 DIAGNOSIS — R82998 Other abnormal findings in urine: Secondary | ICD-10-CM | POA: Diagnosis not present

## 2020-07-28 DIAGNOSIS — Z1331 Encounter for screening for depression: Secondary | ICD-10-CM | POA: Diagnosis not present

## 2020-08-26 DIAGNOSIS — Z1212 Encounter for screening for malignant neoplasm of rectum: Secondary | ICD-10-CM | POA: Diagnosis not present

## 2020-09-29 DIAGNOSIS — Z124 Encounter for screening for malignant neoplasm of cervix: Secondary | ICD-10-CM | POA: Diagnosis not present

## 2020-09-29 DIAGNOSIS — Z6825 Body mass index (BMI) 25.0-25.9, adult: Secondary | ICD-10-CM | POA: Diagnosis not present

## 2020-09-29 DIAGNOSIS — Z1231 Encounter for screening mammogram for malignant neoplasm of breast: Secondary | ICD-10-CM | POA: Diagnosis not present

## 2020-11-10 DIAGNOSIS — I480 Paroxysmal atrial fibrillation: Secondary | ICD-10-CM | POA: Diagnosis not present

## 2020-11-10 DIAGNOSIS — Z7901 Long term (current) use of anticoagulants: Secondary | ICD-10-CM | POA: Diagnosis not present

## 2020-11-10 DIAGNOSIS — J0100 Acute maxillary sinusitis, unspecified: Secondary | ICD-10-CM | POA: Diagnosis not present

## 2020-11-10 DIAGNOSIS — J309 Allergic rhinitis, unspecified: Secondary | ICD-10-CM | POA: Diagnosis not present

## 2020-12-11 DIAGNOSIS — H1031 Unspecified acute conjunctivitis, right eye: Secondary | ICD-10-CM | POA: Diagnosis not present

## 2020-12-18 DIAGNOSIS — H1031 Unspecified acute conjunctivitis, right eye: Secondary | ICD-10-CM | POA: Diagnosis not present

## 2021-01-08 DIAGNOSIS — M810 Age-related osteoporosis without current pathological fracture: Secondary | ICD-10-CM | POA: Diagnosis not present

## 2021-01-08 DIAGNOSIS — H903 Sensorineural hearing loss, bilateral: Secondary | ICD-10-CM | POA: Diagnosis not present

## 2021-01-08 DIAGNOSIS — H9113 Presbycusis, bilateral: Secondary | ICD-10-CM | POA: Diagnosis not present

## 2021-01-08 DIAGNOSIS — H8111 Benign paroxysmal vertigo, right ear: Secondary | ICD-10-CM | POA: Diagnosis not present

## 2021-01-08 DIAGNOSIS — M26629 Arthralgia of temporomandibular joint, unspecified side: Secondary | ICD-10-CM | POA: Diagnosis not present

## 2021-01-08 DIAGNOSIS — E559 Vitamin D deficiency, unspecified: Secondary | ICD-10-CM | POA: Diagnosis not present

## 2021-01-08 DIAGNOSIS — H9313 Tinnitus, bilateral: Secondary | ICD-10-CM | POA: Diagnosis not present

## 2021-01-20 ENCOUNTER — Ambulatory Visit: Payer: PPO | Admitting: Physical Therapy

## 2021-01-22 ENCOUNTER — Ambulatory Visit: Payer: PPO | Attending: Physician Assistant | Admitting: Physical Therapy

## 2021-01-22 ENCOUNTER — Other Ambulatory Visit: Payer: Self-pay

## 2021-01-22 DIAGNOSIS — R2681 Unsteadiness on feet: Secondary | ICD-10-CM | POA: Insufficient documentation

## 2021-01-22 DIAGNOSIS — R2689 Other abnormalities of gait and mobility: Secondary | ICD-10-CM | POA: Insufficient documentation

## 2021-01-22 DIAGNOSIS — R42 Dizziness and giddiness: Secondary | ICD-10-CM | POA: Insufficient documentation

## 2021-01-22 NOTE — Patient Instructions (Signed)
Gaze Stabilization: Tip Card  1.Target must remain in focus, not blurry, and appear stationary while head is in motion. 2.Perform exercises with small head movements (45 to either side of midline). 3.Increase speed of head motion so long as target is in focus. 4.If you wear eyeglasses, be sure you can see target through lens (therapist will give specific instructions for bifocal / progressive lenses). 5.These exercises may provoke dizziness or nausea. Work through these symptoms. If too dizzy, slow head movement slightly. Rest between each exercise. 6.Exercises demand concentration; avoid distractions. 7.For safety, perform standing exercises close to a counter, wall, corner, or next to someone.  Copyright  VHI. All rights reserved.    Gaze Stabilization: Standing Feet Apart    Feet shoulder width apart, keeping eyes on target on wall __5-6__ feet away, tilt head down 15-30 and move head side to side for _60__ seconds. Repeat while moving head up and down for _60___ seconds. Do __3__ sessions per day. Repeat using target on pattern background.  Copyright  VHI. All rights reserved.

## 2021-01-22 NOTE — Therapy (Signed)
[facility] [address] [place], [place], [address] Phone: [phone]   Fax:  [phone]  Physical Therapy Evaluation  Patient Details  Name: ARACELIS ULREY MRN: [id number] Date of Birth: Feb 26, 1946 Referring Provider (PT): Pietro Cassis, [place]   Encounter Date: 01/22/2021   PT End of Session - 01/22/21 1323    Visit Number 1    Number of Visits 9    Date for PT Re-Evaluation 02/20/21    Authorization Type [employer]    Authorization Time Period 01-22-21 - 03-24-21    PT Start Time 0800    PT Stop Time 0845    PT Time Calculation (min) 45 min    Activity Tolerance Patient tolerated treatment well    Behavior During Therapy [facility] for tasks assessed/performed           Past Medical History:  Diagnosis Date  . Arthritis   . Closed fracture of left proximal humerus   . Closed right ankle fracture   . Depression with anxiety   . Elevated BP   . GERD (gastroesophageal reflux disease)   . Hyperlipidemia   . Hypertension   . Impaired fasting glucose   . Osteopenia   . Osteoporosis   . Paroxysmal atrial fibrillation (HCC)   . Vitamin D deficiency   . Wears glasses     Past Surgical History:  Procedure Laterality Date  . COLONOSCOPY  05/10/2011  . DIAGNOSTIC MAMMOGRAM  10 /09/2010  . ORIF HUMERUS FRACTURE Left 08/18/2017   Procedure: OPEN REDUCTION INTERNAL FIXATION (ORIF) LEFT PROXIMAL HUMERUS FRACTURE;  Surgeon: Meredith Pel, MD;  Location: [facility];  Service: Orthopedics;  Laterality: Left;  . TIBIA FRACTURE SURGERY  04/2006   Left--From falling down the stairs  . TIBIA FRACTURE SURGERY     In her 40's    There were no vitals filed for this visit.    Subjective Assessment - 01/22/21 0805    Subjective Pt states she has dizziness if she makes quick turns of her head; states she fell a couple of weeks ago at her mailbox but does not know exactly what happened that caused her to fall. Pt states  dizziness started approx. 2-3 yrs ago but ignored it - attributed it to clumsiness or tripping on something; pt reports she has tinnitus; reports she had nausea & vomiting a few yrs ago with the dizziness.  Denies dizziness at this time.  does not see a pattern in occurrence. Pt states she fell and broke her Lt shoulder in Nov. 2018 - was walking with her daughter and husband and lost her balance. Has been diagnosed with age-related hearing loss.    Pertinent History tinnitus, arthritis, depression with anxiety, HTN, osteoporosis, PAF    Patient Stated Goals "Not to fall" - "figure out what I'm doing that is causing me to fall"    Currently in Pain? No/denies              [facility] PT Assessment - 01/22/21 5631      Assessment   Medical Diagnosis BPPV; Dizziness    Referring Provider (PT) Pietro Cassis, PA    Onset Date/Surgical Date --   approx. 2 yrs ago for onset of vertigo: referral date 01-14-21   Prior Therapy none      Precautions   Precautions Fall   mild fall risk due to intermittent vertigo     Balance Screen   Has the patient fallen in the past 6 months  Yes    How many times? 1    Has the patient had a decrease in activity level because of a fear of falling?  Yes    Is the patient reluctant to leave their home because of a fear of falling?  No      Prior Function   Level of Independence Independent    Vocation Retired    Leisure pt states she likes to walk but is very cautious due to dizziness - states she amb. at slow speed      Observation/Other Assessments   Focus on Therapeutic Outcomes (FOTO)  pt's FS primary measure score 57/100 with risk adjusted 53/100;  DPS 55.6/100                  Vestibular Assessment - 01/22/21 0001      Vestibular Assessment   General Observation pt is a 75 yr old lady with c/o vertigo with head turns and with eye movements; also reports she has tinnitus - saw Dr. Constance Holster last week for first time;  thought she may have BPPV       Symptom Behavior   Subjective history of current problem pt reports she has had recurrent chronic dizziness and tinnitus for approx. 2 years; pt states she has had some falls - 1 fall she sustained a fractured Lt shoulder and Lt tibia (approx. Nov. 2018) and fell about 3 weeks at her mailbox - doesn't really know what happened; states she has anxiety with walking because she now has a fear of falling    Type of Dizziness  Blurred vision;Imbalance;Spinning;Unsteady with head/body turns;Lightheadedness    Frequency of Dizziness daily    Duration of Dizziness minutes    Symptom Nature Motion provoked;Variable    Aggravating Factors Activity in general;Moving eyes;Turning head quickly    Relieving Factors Head stationary;Slow movements    Progression of Symptoms No change since onset    History of similar episodes pt reports severe episode of vertigo approx. 2 yrs ago - had nausea and vomiting with the dizziness      Oculomotor Exam   Oculomotor Alignment Normal    Spontaneous Absent    Smooth Pursuits Comment   mild nystagmus noted with horizontal smooth pursuits; pt reported dizziness with both horizontal and vertical smooth pursuit testing   Saccades Comment;Intact   pt reported increased dizziness with both horizontal & vertical saccades testing     Visual Acuity   Static line 11    Dynamic line 5      Positional Testing   Sidelying Test Sidelying Right;Sidelying Left      Sidelying Right   Sidelying Right Duration none    Sidelying Right Symptoms No nystagmus      Sidelying Left   Sidelying Left Duration approx. 15 secs    Sidelying Left Symptoms No nystagmus              Objective measurements completed on examination: See above findings.               PT Education - 01/22/21 1321    Education Details x1 viewing in standing    Person(s) Educated Patient    Methods Explanation;Demonstration;Handout    Comprehension Verbalized understanding;Returned  demonstration               PT Long Term Goals - 01/22/21 1914      PT LONG TERM GOAL #1   Title Pt will improve DVA from 6 line difference to </=  4 line difference to demo improved VOR for gaze stabilization.    Baseline line 11/line 5    Time 4    Period Weeks    Status New    Target Date 02/20/21      PT LONG TERM GOAL #2   Title Pt will improve DGI by at least 4 points to demo increased balance and safety with ambulation.    Time 4    Period Weeks    Status New    Target Date 02/20/21      PT LONG TERM GOAL #3   Title Improve Foto score from 57/100 to >/= 65/100 to demo improvement in dizziness.    Baseline 57/100 on 01-22-21    Time 4    Period Weeks    Status New    Target Date 02/20/21      PT LONG TERM GOAL #4   Title Independent in HEP for balance and vestibular exercises.    Time 4    Period Weeks    Status New    Target Date 02/20/21                  Plan - 01/22/21 1324    Clinical Impression Statement Pt is a 75 yr old lady with c/o episodic dizziness which occurs with head and eye movements; pt also reports she has tinnitus.  Pt was referred by Pietro Cassis, PA for possible Rt BPPV.  Pt's symptoms are not consistent with Rt BPPV at this time, but rather more consistent with a vestibular hypofunction.  Pt has impaired VOR as there is a 6 line difference between SVA and DVA (line 11 & line 5).  Pt will benefit from skilled PT to address dizziness and vestibular deficits as well as gait, and balance deficits.    Personal Factors and Comorbidities Behavior Pattern;Comorbidity 2;Time since onset of injury/illness/exacerbation    Comorbidities arthritis, depression with anxiety, HTN, osteoporosis, PAF    Examination-Activity Limitations Locomotion Level;Bend;Squat;Transfers    Examination-Participation Restrictions Cleaning;Driving;Shop;Laundry;Meal Prep    Stability/Clinical Decision Making Evolving/Moderate complexity    Clinical Decision Making  Moderate    Rehab Potential Good    PT Frequency 2x / week    PT Duration 4 weeks    PT Treatment/Interventions Vestibular;ADLs/Self Care Home Management;Balance training;Neuromuscular re-education;Patient/family education;Gait training;Therapeutic activities;Stair training;Therapeutic exercise    PT Next Visit Plan check x1 viewing; do balance on foam - mdctsib    PT Home Exercise Plan x1 viewing    Consulted and Agree with Plan of Care Patient           Patient will benefit from skilled therapeutic intervention in order to improve the following deficits and impairments:  Dizziness,Decreased balance,Difficulty walking  Visit Diagnosis: Dizziness and giddiness - Plan: PT plan of care cert/re-cert  Other abnormalities of gait and mobility - Plan: PT plan of care cert/re-cert  Unsteadiness on feet - Plan: PT plan of care cert/re-cert     Problem List Patient Active Problem List   Diagnosis Date Noted  . Shoulder fracture, left 08/18/2017  . Paroxysmal A-fib ([place]) 09/08/2012    Shantina Chronister, [place], PT 01/22/2021, 7:25 PM  [facility] [address] [place], [place], [address] Phone: [phone]   Fax:  [phone]  Name: YVETTE LOVELESS MRN: [id number] Date of Birth: [birth year]

## 2021-02-03 ENCOUNTER — Ambulatory Visit: Payer: PPO | Admitting: Physical Therapy

## 2021-02-05 ENCOUNTER — Encounter: Payer: PPO | Admitting: Physical Therapy

## 2021-02-09 ENCOUNTER — Ambulatory Visit: Payer: PPO | Admitting: Physical Therapy

## 2021-02-12 ENCOUNTER — Ambulatory Visit: Payer: PPO | Admitting: Physical Therapy

## 2021-02-17 ENCOUNTER — Encounter: Payer: PPO | Admitting: Physical Therapy

## 2021-02-19 ENCOUNTER — Encounter: Payer: PPO | Admitting: Physical Therapy

## 2021-02-23 ENCOUNTER — Encounter: Payer: PPO | Admitting: Physical Therapy

## 2021-02-24 ENCOUNTER — Encounter: Payer: PPO | Admitting: Physical Therapy

## 2021-03-03 DIAGNOSIS — M810 Age-related osteoporosis without current pathological fracture: Secondary | ICD-10-CM | POA: Diagnosis not present

## 2021-03-03 DIAGNOSIS — E559 Vitamin D deficiency, unspecified: Secondary | ICD-10-CM | POA: Diagnosis not present

## 2021-03-25 ENCOUNTER — Other Ambulatory Visit (HOSPITAL_COMMUNITY): Payer: Self-pay | Admitting: *Deleted

## 2021-03-26 ENCOUNTER — Ambulatory Visit (HOSPITAL_COMMUNITY)
Admission: RE | Admit: 2021-03-26 | Discharge: 2021-03-26 | Disposition: A | Discharge status: Home / Self Care | Payer: PPO | Source: Ambulatory Visit | Attending: Internal Medicine | Admitting: Internal Medicine

## 2021-03-26 ENCOUNTER — Other Ambulatory Visit: Payer: Self-pay

## 2021-03-26 DIAGNOSIS — M810 Age-related osteoporosis without current pathological fracture: Secondary | ICD-10-CM | POA: Insufficient documentation

## 2021-03-26 MED ORDER — ZOLEDRONIC ACID 5 MG/100ML IV SOLN
INTRAVENOUS | Status: AC
  Filled 2021-03-26: qty 100

## 2021-03-26 MED ORDER — ZOLEDRONIC ACID 5 MG/100ML IV SOLN
5.0000 mg | Freq: Once | INTRAVENOUS | Status: AC
  Administered 2021-03-26: 5 mg via INTRAVENOUS

## 2021-03-26 NOTE — Progress Notes (Signed)
Removed NSL rt AC cath intact site U.

## 2021-05-08 DIAGNOSIS — Z1152 Encounter for screening for COVID-19: Secondary | ICD-10-CM | POA: Diagnosis not present

## 2021-05-08 DIAGNOSIS — J019 Acute sinusitis, unspecified: Secondary | ICD-10-CM | POA: Diagnosis not present

## 2021-05-08 DIAGNOSIS — J029 Acute pharyngitis, unspecified: Secondary | ICD-10-CM | POA: Diagnosis not present

## 2021-05-08 DIAGNOSIS — R051 Acute cough: Secondary | ICD-10-CM | POA: Diagnosis not present

## 2021-06-01 DIAGNOSIS — L738 Other specified follicular disorders: Secondary | ICD-10-CM | POA: Diagnosis not present

## 2021-06-01 DIAGNOSIS — D1801 Hemangioma of skin and subcutaneous tissue: Secondary | ICD-10-CM | POA: Diagnosis not present

## 2021-06-01 DIAGNOSIS — Z85820 Personal history of malignant melanoma of skin: Secondary | ICD-10-CM | POA: Diagnosis not present

## 2021-06-01 DIAGNOSIS — L821 Other seborrheic keratosis: Secondary | ICD-10-CM | POA: Diagnosis not present

## 2021-07-23 DIAGNOSIS — H52202 Unspecified astigmatism, left eye: Secondary | ICD-10-CM | POA: Diagnosis not present

## 2021-07-23 DIAGNOSIS — Z961 Presence of intraocular lens: Secondary | ICD-10-CM | POA: Diagnosis not present

## 2021-07-23 DIAGNOSIS — H524 Presbyopia: Secondary | ICD-10-CM | POA: Diagnosis not present

## 2021-08-04 DIAGNOSIS — M545 Low back pain, unspecified: Secondary | ICD-10-CM | POA: Diagnosis not present

## 2021-08-04 DIAGNOSIS — M810 Age-related osteoporosis without current pathological fracture: Secondary | ICD-10-CM | POA: Diagnosis not present

## 2021-08-19 ENCOUNTER — Other Ambulatory Visit: Payer: Self-pay

## 2021-08-19 ENCOUNTER — Ambulatory Visit: Payer: PPO | Admitting: Orthopaedic Surgery

## 2021-08-19 DIAGNOSIS — E785 Hyperlipidemia, unspecified: Secondary | ICD-10-CM | POA: Diagnosis not present

## 2021-08-19 DIAGNOSIS — I10 Essential (primary) hypertension: Secondary | ICD-10-CM | POA: Diagnosis not present

## 2021-08-19 DIAGNOSIS — M545 Low back pain, unspecified: Secondary | ICD-10-CM

## 2021-08-19 DIAGNOSIS — R7301 Impaired fasting glucose: Secondary | ICD-10-CM | POA: Diagnosis not present

## 2021-08-19 DIAGNOSIS — E559 Vitamin D deficiency, unspecified: Secondary | ICD-10-CM | POA: Diagnosis not present

## 2021-08-19 NOTE — Progress Notes (Signed)
The patient is a 75 year old female well-known to me.  I have actually pared down the callus before on her left foot under her fifth metatarsal.  This been a long time but she is having pain with weightbearing on that area and would like to have it pared down again.  She also has come in with acute low back pain.  She is going to work out at a place called [employer] in order to get in shape.  She was performing a leg press and felt a pop in her lower back and had significant pain since then in the lower aspect of her lumbar spine and upper pelvis.  She denies any radicular symptoms.  Her primary care physician did obtain x-rays which were negative per the patient for any type of fracture.  We do not have those studies or access to the studies.  She denies any change in bowel or bladder function.  She denies any weakness in her legs and has tried to rest but still wants to stay active.  She does walk quite a bit.  She denies any numbness and tingling.  On exam she does have pain in the lower aspect of her lumbar spine and upper pelvis of the midline.  This is certainly consistent with some type of injury.  She does have a history of osteoporosis and I cannot rule out insufficiency fracture but she is feeling better.  The left foot under her fifth metatarsal does have a callus that is built up.  I did use #10 blade and pared down the callus and this made her more comfortable.  From an exercise standpoint, she understands that I want her just to rest but walking is okay but no heavy lifting or no quad strengthening exercises for now in terms of the leg press.  I told her to give it at least another 4 to 6 weeks and if is not improving to let us know and we will see her back with new x-rays.  All question concerns were answered addressed.  Follow-up otherwise as needed.

## 2021-08-24 NOTE — Progress Notes (Signed)
Cardiology Office Note   Date:  08/27/2021   ID:  Tranesha, Lessner 07-Dec-1945, MRN [id number]  PCP:  Ginger Organ., MD  Cardiologist:   Sister Carbone Martinique, MD   Chief Complaint  Patient presents with   Atrial Fibrillation       History of Present Illness: Alyssa Martin is a 75 y.o. female who presents for follow up Afib. She has a history of paroxysmal atrial fibrillation dating back to 2013.  She has been on cardizem and Xarelto.  She denies any significant palpitations over the past year. Now has an Apple watch.  No dizziness, tachycardia, chest pain or SOB. no bleeding.    Past Medical History:  Diagnosis Date   Arthritis    Closed fracture of left proximal humerus    Closed right ankle fracture    Depression with anxiety    Elevated BP    GERD (gastroesophageal reflux disease)    Hyperlipidemia    Hypertension    Impaired fasting glucose    Osteopenia    Osteoporosis    Paroxysmal atrial fibrillation (HCC)    Vitamin D deficiency    Wears glasses     Past Surgical History:  Procedure Laterality Date   COLONOSCOPY  05/10/2011   DIAGNOSTIC MAMMOGRAM  10 /09/2010   ORIF HUMERUS FRACTURE Left 08/18/2017   Procedure: OPEN REDUCTION INTERNAL FIXATION (ORIF) LEFT PROXIMAL HUMERUS FRACTURE;  Surgeon: Meredith Pel, MD;  Location: [facility];  Service: Orthopedics;  Laterality: Left;   TIBIA FRACTURE SURGERY  04/2006   Left--From falling down the stairs   TIBIA FRACTURE SURGERY     In her 40's     Current Outpatient Medications  Medication Sig Dispense Refill   Calcium Citrate (CITRACAL PO) Take 630 mg by mouth 2 (two) times daily.      ergocalciferol (VITAMIN D2) 50000 UNITS capsule Take 50,000 Units by mouth every 14 (fourteen) days.     Estradiol 10 MCG TABS vaginal tablet Yuvafem 10 mcg vaginal tablet     famotidine (PEPCID) 20 MG tablet Take 20 mg by mouth as needed for heartburn or indigestion.     loratadine (CLARITIN) 10 MG tablet Take 10 mg by  mouth daily as needed for allergies.      mometasone (NASONEX) 50 MCG/ACT nasal spray Place 2 sprays into the nose daily as needed (allergies).      Multiple Vitamin (MULTIVITAMIN) tablet Take 1 tablet by mouth daily.     simvastatin (ZOCOR) 20 MG tablet Take 20 mg by mouth every evening.     vitamin C (ASCORBIC ACID) 500 MG tablet Take 500 mg by mouth daily.     zoledronic acid (RECLAST) 5 MG/100ML SOLN injection Inject 5 mg into the vein once. Yearly     diltiazem (DILT-XR) 180 MG 24 hr capsule Take 1 capsule (180 mg total) by mouth daily. 90 capsule 3   rivaroxaban (XARELTO) 20 MG TABS tablet Take 1 tablet (20 mg total) by mouth daily with supper. 90 tablet 1   No current facility-administered medications for this visit.    Allergies:   Latex, Penicillins, and Ativan [lorazepam]    Social History:  The patient  reports that she has never smoked. She has never used smokeless tobacco. She reports that she does not drink alcohol and does not use drugs.   Family History:  The patient's family history includes Bone cancer in her brother and father; Hypertension in her mother; Leukemia in her  paternal grandfather; Lung cancer in her brother and paternal grandmother; Osteoarthritis in her mother; Osteopenia in her father.    ROS:  Please see the history of present illness.   Otherwise, review of systems are positive for none.   All other systems are reviewed and negative.    PHYSICAL EXAM: VS:  BP 120/78   Pulse 71   Ht 4\' 11"  (1.499 m)   Wt 125 lb 6.4 oz (56.9 kg)   SpO2 97%   BMI 25.33 kg/m  , BMI Body mass index is 25.33 kg/m. GEN: Well nourished, well developed, in no acute distress  HEENT: normal  Neck: no JVD, carotid bruits, or masses Cardiac: IRRR; no murmurs, rubs, or gallops,no edema  Respiratory:  clear to auscultation bilaterally, normal work of breathing GI: soft, nontender, nondistended, + BS MS: no deformity or atrophy  Skin: warm and dry, no rash Neuro:  Strength  and sensation are intact Psych: euthymic mood, full affect   EKG:  EKG is ordered today. The ekg ordered today demonstrates NSR, normal Ecg. I have personally reviewed and interpreted this study.    Recent Labs: No results found for requested labs within last 8760 hours.    Lipid Panel No results found for: CHOL, TRIG, HDL, CHOLHDL, VLDL, LDLCALC, LDLDIRECT   Dated 07/17/19: cholesterol 161, triglycerides 83, HDL 65, LDL79. A1c 5.5%. normal CBC and TSH Dated 11/22/19: normal CMET. Dated 08/19/21: cholesterol 155, triglycerides 82, HDL 53, LDL 86. A1c 5.5%. CMET and TSH normal.  Wt Readings from Last 3 Encounters:  08/27/21 125 lb 6.4 oz (56.9 kg)  03/26/21 125 lb (56.7 kg)  06/20/20 125 lb 12.8 oz (57.1 kg)      ASSESSMENT AND PLAN:  1. Paroxysmal atrial fibrillation.  Currently asymptomatic. Good rate control on diltiazem. On Xarelto for anticoagulation.      Current medicines are reviewed at length with the patient today.  The patient does not have concerns regarding medicines.  The following changes have been made:  no change  Labs/ tests ordered today include:   Orders Placed This Encounter  Procedures   EKG 12-Lead      Disposition:   FU with me in 1 year  Signed, Alyssa Carles Martinique, MD  08/27/2021 9:44 AM    [facility] [address] [place], [place], [address] Phone [phone], Fax [phone]

## 2021-08-25 DIAGNOSIS — J309 Allergic rhinitis, unspecified: Secondary | ICD-10-CM | POA: Diagnosis not present

## 2021-08-25 DIAGNOSIS — R82998 Other abnormal findings in urine: Secondary | ICD-10-CM | POA: Diagnosis not present

## 2021-08-25 DIAGNOSIS — H8110 Benign paroxysmal vertigo, unspecified ear: Secondary | ICD-10-CM | POA: Diagnosis not present

## 2021-08-25 DIAGNOSIS — E559 Vitamin D deficiency, unspecified: Secondary | ICD-10-CM | POA: Diagnosis not present

## 2021-08-25 DIAGNOSIS — I10 Essential (primary) hypertension: Secondary | ICD-10-CM | POA: Diagnosis not present

## 2021-08-25 DIAGNOSIS — Z0000 Encounter for general adult medical examination without abnormal findings: Secondary | ICD-10-CM | POA: Diagnosis not present

## 2021-08-25 DIAGNOSIS — M810 Age-related osteoporosis without current pathological fracture: Secondary | ICD-10-CM | POA: Diagnosis not present

## 2021-08-25 DIAGNOSIS — Z1339 Encounter for screening examination for other mental health and behavioral disorders: Secondary | ICD-10-CM | POA: Diagnosis not present

## 2021-08-25 DIAGNOSIS — Z7901 Long term (current) use of anticoagulants: Secondary | ICD-10-CM | POA: Diagnosis not present

## 2021-08-25 DIAGNOSIS — E785 Hyperlipidemia, unspecified: Secondary | ICD-10-CM | POA: Diagnosis not present

## 2021-08-25 DIAGNOSIS — D6869 Other thrombophilia: Secondary | ICD-10-CM | POA: Diagnosis not present

## 2021-08-25 DIAGNOSIS — Z1331 Encounter for screening for depression: Secondary | ICD-10-CM | POA: Diagnosis not present

## 2021-08-25 DIAGNOSIS — I480 Paroxysmal atrial fibrillation: Secondary | ICD-10-CM | POA: Diagnosis not present

## 2021-08-25 DIAGNOSIS — R7301 Impaired fasting glucose: Secondary | ICD-10-CM | POA: Diagnosis not present

## 2021-08-25 DIAGNOSIS — F3342 Major depressive disorder, recurrent, in full remission: Secondary | ICD-10-CM | POA: Diagnosis not present

## 2021-08-27 ENCOUNTER — Other Ambulatory Visit: Payer: Self-pay

## 2021-08-27 ENCOUNTER — Encounter: Payer: Self-pay | Admitting: Cardiology

## 2021-08-27 ENCOUNTER — Ambulatory Visit: Payer: PPO | Admitting: Cardiology

## 2021-08-27 VITALS — BP 120/78 | HR 71 | Ht 59.0 in | Wt 125.4 lb

## 2021-08-27 DIAGNOSIS — I480 Paroxysmal atrial fibrillation: Secondary | ICD-10-CM

## 2021-08-27 MED ORDER — RIVAROXABAN 20 MG PO TABS: 20.0000 mg | ORAL_TABLET | Freq: Every day | ORAL | 1 refills | Status: DC

## 2021-08-27 MED ORDER — DILTIAZEM HCL ER 180 MG PO CP24: 180.0000 mg | ORAL_CAPSULE | Freq: Every day | ORAL | 3 refills | Status: DC

## 2021-10-08 DIAGNOSIS — Z1231 Encounter for screening mammogram for malignant neoplasm of breast: Secondary | ICD-10-CM | POA: Diagnosis not present

## 2021-10-08 DIAGNOSIS — Z01419 Encounter for gynecological examination (general) (routine) without abnormal findings: Secondary | ICD-10-CM | POA: Diagnosis not present

## 2021-10-08 DIAGNOSIS — N952 Postmenopausal atrophic vaginitis: Secondary | ICD-10-CM | POA: Diagnosis not present

## 2021-10-08 DIAGNOSIS — Z6826 Body mass index (BMI) 26.0-26.9, adult: Secondary | ICD-10-CM | POA: Diagnosis not present

## 2021-10-08 DIAGNOSIS — R300 Dysuria: Secondary | ICD-10-CM | POA: Diagnosis not present

## 2021-10-26 DIAGNOSIS — M810 Age-related osteoporosis without current pathological fracture: Secondary | ICD-10-CM | POA: Diagnosis not present

## 2021-11-25 ENCOUNTER — Ambulatory Visit (INDEPENDENT_AMBULATORY_CARE_PROVIDER_SITE_OTHER): Payer: PPO

## 2021-11-25 ENCOUNTER — Encounter: Payer: Self-pay | Admitting: Orthopaedic Surgery

## 2021-11-25 ENCOUNTER — Ambulatory Visit: Payer: PPO | Admitting: Orthopaedic Surgery

## 2021-11-25 DIAGNOSIS — M79672 Pain in left foot: Secondary | ICD-10-CM

## 2021-11-25 DIAGNOSIS — M25551 Pain in right hip: Secondary | ICD-10-CM

## 2021-11-25 NOTE — Progress Notes (Signed)
The patient is well-known to me.  She is 76 year old female who has remote history of fixation of a bimalleolar left ankle fracture.  She comes in with a several week history of left foot pain and she points to the fifth metatarsal as a source of her pain.  There is been no injury.  She does have a history of osteoporosis.  She has recently been off her Reclast but apparently there was evidence of bone loss of the right hip area.  She does report right hip pain at the proximal femur area.  She does not walk with any assistive device.  She does have pain with weightbearing with her left foot and her right hip. ? ?Examination of her left foot shows pain to palpation only along the fifth metatarsal.  There is no redness and swelling.  There is no instability on exam.  The ligaments appear normal. ? ?The right hip does have pain on the trochanteric area with range of motion with no groin pain.  Fortunately x-rays of the right hip and proximal femur showed no evidence of a stress response or fracture.  The hip joint space is well-maintained.  Most likely what she is developed on her right hip area is trochanteric bursitis from walking different favoring her left foot and ankle. ? ?We will try postop shoe on her left foot and having her offload that side with a cane on her opposite side.  I would like to see her back in 4 weeks with a repeat 3 views of her left foot.  I may even consider trochanteric steroid injection if needed. ?

## 2021-12-01 DIAGNOSIS — I480 Paroxysmal atrial fibrillation: Secondary | ICD-10-CM | POA: Diagnosis not present

## 2021-12-01 DIAGNOSIS — M810 Age-related osteoporosis without current pathological fracture: Secondary | ICD-10-CM | POA: Diagnosis not present

## 2021-12-01 DIAGNOSIS — G720 Drug-induced myopathy: Secondary | ICD-10-CM | POA: Diagnosis not present

## 2021-12-01 DIAGNOSIS — E559 Vitamin D deficiency, unspecified: Secondary | ICD-10-CM | POA: Diagnosis not present

## 2021-12-01 DIAGNOSIS — E785 Hyperlipidemia, unspecified: Secondary | ICD-10-CM | POA: Diagnosis not present

## 2021-12-01 DIAGNOSIS — D6869 Other thrombophilia: Secondary | ICD-10-CM | POA: Diagnosis not present

## 2021-12-09 ENCOUNTER — Telehealth: Payer: Self-pay | Admitting: Cardiology

## 2021-12-09 NOTE — Telephone Encounter (Signed)
Spoke to patient Dr.Jordan advised ok to take CoQ 10. ?

## 2021-12-09 NOTE — Telephone Encounter (Signed)
Spoke to patient she stated her PCP advised her to take CoQ 10 she wanted to know if Dr.Jordan agreed.Advised I will send message to him for advice. ?

## 2021-12-09 NOTE — Telephone Encounter (Signed)
That is OK with me ? ?Mohd Clemons Martinique MD, [facility] ? ?

## 2021-12-09 NOTE — Telephone Encounter (Signed)
Pt c/o medication issue: ? ?1. Name of Medication: coq-10 ? ?2. How are you currently taking this medication (dosage and times per day)?   ? ?3. Are you having a reaction (difficulty breathing--STAT)? no ? ?4. What is your medication issue? Patient wanted to make sure it was okay for her to take. It was recommend by her PCP ? ?

## 2021-12-23 ENCOUNTER — Ambulatory Visit: Payer: PPO | Admitting: Orthopaedic Surgery

## 2021-12-23 ENCOUNTER — Encounter: Payer: Self-pay | Admitting: Orthopaedic Surgery

## 2021-12-23 ENCOUNTER — Ambulatory Visit (INDEPENDENT_AMBULATORY_CARE_PROVIDER_SITE_OTHER): Payer: PPO

## 2021-12-23 DIAGNOSIS — M25572 Pain in left ankle and joints of left foot: Secondary | ICD-10-CM

## 2021-12-23 DIAGNOSIS — M79672 Pain in left foot: Secondary | ICD-10-CM | POA: Diagnosis not present

## 2021-12-23 NOTE — Progress Notes (Signed)
?  The patient is well-known to me.  A month ago we saw her for pain with her left foot.  We noted a stress fracture of the fifth metatarsal.  She is 76 years old and very active.  We had her ambulate with weightbearing as tolerated in a postoperative shoe.  She does report increased motion and strength and less pain.  She is doing better overall. ? ?There is only slight sensitivity to palpation over the left fifth metatarsal. ? ?3 views left foot show that the fracture is healing appropriately.  I did go over the x-rays with her. ? ?She will slowly increase her activities as comfort allows.  She should avoid high impact aerobic activities for at least another 6 to 8 weeks but should do well in the long run.  She can transition back to regular tennis shoes as comfort allows.  All questions and concerns were answered and addressed.  Follow-up is as needed. ? ? ? ? ? ? ? ? ? ? ? ? ? ? ? ? ? ? ? ? ? ? ? ? ? ? ? ? ? ?

## 2022-01-13 DIAGNOSIS — M533 Sacrococcygeal disorders, not elsewhere classified: Secondary | ICD-10-CM | POA: Diagnosis not present

## 2022-01-13 DIAGNOSIS — M810 Age-related osteoporosis without current pathological fracture: Secondary | ICD-10-CM | POA: Diagnosis not present

## 2022-01-13 DIAGNOSIS — M7061 Trochanteric bursitis, right hip: Secondary | ICD-10-CM | POA: Diagnosis not present

## 2022-01-25 ENCOUNTER — Other Ambulatory Visit: Payer: Self-pay

## 2022-01-25 ENCOUNTER — Ambulatory Visit: Payer: PPO | Admitting: Orthopaedic Surgery

## 2022-01-25 DIAGNOSIS — M7061 Trochanteric bursitis, right hip: Secondary | ICD-10-CM

## 2022-01-25 NOTE — Progress Notes (Signed)
Alyssa Martin comes in today with continued right hip pain over the trochanteric area of her right hip.  When I saw her back in March we did obtain x-rays of her right hip and pelvis and it was normal in terms of joint space.  Her diagnosis seems to be more of trochanteric bursitis and IT band syndrome proximally.  She hurts around the lateral aspect of her right hip.  I think this really worsened after dealing with left foot issues and is changed her gait. ? ?Examination of her right hip today shows that it moves smoothly and fluidly.  Her pain is to palpation of the trochanteric area consistent with trochanteric bursitis.  She cannot take anti-inflammatories because of believe she is on blood thinning medication. ? ?She is very interested in outpatient therapy and I think this will help with the most.  We can hopefully get this set up through the [facility] system and any modalities per the therapist discretion on helping her right hip feel better and get better.  I will see her back in 6 weeks to see how she is doing overall.  All question concerns were answered addressed.  She agrees with this treatment plan. ?

## 2022-01-26 ENCOUNTER — Ambulatory Visit: Payer: PPO | Attending: Orthopaedic Surgery

## 2022-01-26 DIAGNOSIS — M25551 Pain in right hip: Secondary | ICD-10-CM | POA: Diagnosis not present

## 2022-01-26 DIAGNOSIS — R262 Difficulty in walking, not elsewhere classified: Secondary | ICD-10-CM | POA: Diagnosis not present

## 2022-01-26 DIAGNOSIS — M6281 Muscle weakness (generalized): Secondary | ICD-10-CM | POA: Diagnosis not present

## 2022-01-26 DIAGNOSIS — M7061 Trochanteric bursitis, right hip: Secondary | ICD-10-CM | POA: Diagnosis not present

## 2022-01-26 NOTE — Therapy (Signed)
?OUTPATIENT PHYSICAL THERAPY LOWER EXTREMITY EVALUATION ? ? ?Patient Name: Alyssa Martin ?MRN: [id number] ?DOB:[birth year], 76 y.o., female ?Today's Date: 01/26/2022 ? ? PT End of Session - 01/26/22 1023   ? ? Visit Number 1   ? Date for PT Re-Evaluation 03/23/22   ? Authorization Type [employer]   ? PT Start Time 1015   ? PT Stop Time 1100   ? PT Time Calculation (min) 45 min   ? Activity Tolerance Patient tolerated treatment well   ? Behavior During Therapy [facility] for tasks assessed/performed   ? ?  ?  ? ?  ? ? ?Past Medical History:  ?Diagnosis Date  ? Arthritis   ? Closed fracture of left proximal humerus   ? Closed right ankle fracture   ? Depression with anxiety   ? Elevated BP   ? GERD (gastroesophageal reflux disease)   ? Hyperlipidemia   ? Hypertension   ? Impaired fasting glucose   ? Osteopenia   ? Osteoporosis   ? Paroxysmal atrial fibrillation (HCC)   ? Vitamin D deficiency   ? Wears glasses   ? ?Past Surgical History:  ?Procedure Laterality Date  ? COLONOSCOPY  05/10/2011  ? DIAGNOSTIC MAMMOGRAM  10 /09/2010  ? ORIF HUMERUS FRACTURE Left 08/18/2017  ? Procedure: OPEN REDUCTION INTERNAL FIXATION (ORIF) LEFT PROXIMAL HUMERUS FRACTURE;  Surgeon: Meredith Pel, MD;  Location: [facility];  Service: Orthopedics;  Laterality: Left;  ? TIBIA FRACTURE SURGERY  04/2006  ? Left--From falling down the stairs  ? TIBIA FRACTURE SURGERY    ? In her 43's  ? ?Patient Active Problem List  ? Diagnosis Date Noted  ? Trochanteric bursitis, right hip 01/26/2022  ? Shoulder fracture, left 08/18/2017  ? Paroxysmal A-fib ([place]) 09/08/2012  ? ? ?PCP: Ginger Organ., MD ? ?REFERRING PROVIDER: Mcarthur Rossetti, MD  ? ?REFERRING DIAG: M70.61 (ICD-10-CM) - Trochanteric bursitis, right hip  ? ?THERAPY DIAG:  ?Pain in right hip - Plan: PT plan of care cert/re-cert ? ?Muscle weakness (generalized) - Plan: PT plan of care cert/re-cert ? ?Difficulty in walking, not elsewhere classified - Plan: PT plan of care  cert/re-cert ? ?ONSET DATE: 01/25/22 ? ?SUBJECTIVE:  ? ?SUBJECTIVE STATEMENT: ?Patient states she has osteoporosis and had fallen a couple of times.  This is what got her initially to Dr. Ninfa Linden.  She also broke her ankle a few years after that she feels she has compensated which has caused her right hip pain.  She is concerned about her balance and is becoming more concerned about falling.  She hopes to gain strength and reduce her hip pain as well as decrease her fall risk.   ? ?PERTINENT HISTORY: ?See above ? ?PAIN:  ?Are you having pain? Yes: NPRS scale: 3/10 ?Pain location: right hip ?Pain description: aching ?Aggravating factors: walking ?Relieving factors: rest ? ?PRECAUTIONS: Fall and Other: osteoporosis ? ?WEIGHT BEARING RESTRICTIONS No ? ?FALLS:  ?Has patient fallen in last 6 months? No ? ?LIVING ENVIRONMENT: ?Lives with: lives alone ?Lives in: House/apartment ?Stairs: Yes: Internal: 2 steps; on right going up and External: 1 steps; on right going up ?Has following equipment at home: None ? ?OCCUPATION: retired ? ?PLOF: Independent ? ?PATIENT GOALS To get stronger and be able to get out and walk without worry of falling.  I want to keep mobile.   ? ? ?OBJECTIVE:  ? ?DIAGNOSTIC FINDINGS: XR HIP UNILAT W OR W/O PELVIS 1V RIGHT (Nov 25, 2021)  ?An AP pelvis  lateral right hip shows no acute findings or evidence of  ?            stress fracture.  The hip joint space is well-maintained.  ? ?PATIENT SURVEYS:  ?FOTO 31 (goal 46) ? ?COGNITION: ? Overall cognitive status: Within functional limits for tasks assessed   ?  ?SENSATION: ?WFL ? ?MUSCLE LENGTH: ?Hamstrings: Right 60 deg; Left 60 deg ?Thomas test: Right neg ; Left neg  ? ?POSTURE:  ?Trunk shift to right ? ?PALPATION: ?Palpable tenderness ? ?LE ROM: ? ?WNL ? ?LE MMT: ? ?Generally 4/5 with exception of hip abduction 3+/5 and hip extension 3+/5 bilaterally ? ?LOWER EXTREMITY SPECIAL TESTS:  ?Hip special tests: Saralyn Pilar (FABER) test: negative and Ober's test:  positive  ? ? ? ?GAIT: ?Distance walked: 50 ?Assistive device utilized: None ?Level of assistance: Complete Independence ?Comments: antalgic ? ? ? ?TODAY'S TREATMENT: ?Initial eval completed and initiated HEP ? ? ?PATIENT EDUCATION:  ?Education details: Initiated HEP ?Person educated: Patient ?Education method: Explanation, Demonstration, Verbal cues, and Handouts ?Education comprehension: verbalized understanding, returned demonstration, and verbal cues required ? ? ?HOME EXERCISE PROGRAM: ?Access Code: [email or web address] ?URL: [email or web address] ?Date: 01/26/2022 ?Prepared by: Candyce Churn ? ?Exercises ?- Supine ITB Stretch with Strap  - 1 x daily - 7 x weekly - 3 sets - 10 reps ?- Supine Hamstring Stretch with Strap  - 1 x daily - 7 x weekly - 3 sets - 10 reps ?- Sidelying Hip Abduction  - 1 x daily - 7 x weekly - 3 sets - 10 reps ?- Seated Piriformis Stretch with Trunk Bend  - 1 x daily - 7 x weekly - 3 sets - 10 reps ? ?ASSESSMENT: ? ?CLINICAL IMPRESSION: ?Patient is a 76 y.o. female who was seen today for physical therapy evaluation and treatment for right hip trochanteric bursitis.  She presents with bilateral hamstring and IT band tightness, weakness bilateral hips and antalgic gait as well as tenderness to palpation at right greater trochanter.  She would benefit from LE flexibility and hip strengthening to restore proper hip mechanics and reduce hip pain.   ? ? ?OBJECTIVE IMPAIRMENTS Abnormal gait, decreased balance, decreased mobility, difficulty walking, decreased ROM, decreased strength, increased fascial restrictions, increased muscle spasms, impaired flexibility, and pain.  ? ?ACTIVITY LIMITATIONS cleaning, community activity, driving, meal prep, laundry, yard work, and shopping.  ? ?PERSONAL FACTORS Age and Fitness are also affecting patient's functional outcome.  ? ? ?REHAB POTENTIAL: Good ? ?CLINICAL DECISION MAKING: Stable/uncomplicated ? ?EVALUATION COMPLEXITY: Low ? ? ?GOALS: ?Goals  reviewed with patient? Yes ? ?SHORT TERM GOALS: Target date: 02/23/2022 ? ?Pain report to be no greater than 4/10  ?Baseline: ?Goal status: INITIAL ? ?2.  Patient will be independent with initial HEP  ?Baseline:  ?Goal status: INITIAL ? ?3.  Patient to be able to walk 300 feet without need to stop and rest due to pain ?Baseline:  ?Goal status: INITIAL ? ? ?LONG TERM GOALS: Target date: 03/23/2022 ? ?Patient to be independent with advanced HEP  ?Baseline:  ?Goal status: INITIAL ? ?2.  Patient to report pain no greater than 2/10  ?Baseline:  ?Goal status: INITIAL ? ?3.  FOTO to be 76 ?Baseline:  ?Goal status: INITIAL ? ?4.  Patient to report 75% improved confidence with walking and balance.   ?Baseline:  ?Goal status: INITIAL ? ?5.  Patient to be able to walk or stand for 30 min without need to stop and rest due to pain ?Baseline:  ?Goal status:  INITIAL ? ? ? ? ?PLAN: ?PT FREQUENCY: 2x/week ? ?PT DURATION: 8 weeks ? ?PLANNED INTERVENTIONS: Therapeutic exercises, Therapeutic activity, Neuromuscular re-education, Balance training, Gait training, Patient/Family education, Joint mobilization, Stair training, DME instructions, Aquatic Therapy, Dry Needling, Electrical stimulation, Cryotherapy, Moist heat, Taping, Ultrasound, and Manual therapy ? ?PLAN FOR NEXT SESSION: Review HEP, NuStep, progress hip strengthening and stability training.  ? ? ?Anderson Malta B. Rivers Hamrick, PT ?01/26/22 10:52 PM   ?

## 2022-01-29 ENCOUNTER — Ambulatory Visit: Payer: PPO

## 2022-01-29 DIAGNOSIS — R262 Difficulty in walking, not elsewhere classified: Secondary | ICD-10-CM

## 2022-01-29 DIAGNOSIS — M6281 Muscle weakness (generalized): Secondary | ICD-10-CM

## 2022-01-29 DIAGNOSIS — M25551 Pain in right hip: Secondary | ICD-10-CM

## 2022-01-29 NOTE — Therapy (Signed)
?OUTPATIENT PHYSICAL THERAPY TREATMENT NOTE ? ? ?Patient Name: Alyssa Martin ?MRN: [id number] ?DOB:Sep 05, 1946, 76 y.o., female ?Today's Date: 01/29/2022 ? ?PCP: Ginger Organ., MD ?REFERRING PROVIDER: Mcarthur Rossetti, MD ? ?END OF SESSION:  ? PT End of Session - 01/29/22 0855   ? ? Visit Number 2   ? Date for PT Re-Evaluation 03/23/22   ? Authorization Type [employer]   ? PT Start Time [phone]   ? PT Stop Time 0930   ? PT Time Calculation (min) 40 min   ? Activity Tolerance Patient tolerated treatment well   ? Behavior During Therapy [facility] for tasks assessed/performed   ? ?  ?  ? ?  ? ? ?Past Medical History:  ?Diagnosis Date  ? Arthritis   ? Closed fracture of left proximal humerus   ? Closed right ankle fracture   ? Depression with anxiety   ? Elevated BP   ? GERD (gastroesophageal reflux disease)   ? Hyperlipidemia   ? Hypertension   ? Impaired fasting glucose   ? Osteopenia   ? Osteoporosis   ? Paroxysmal atrial fibrillation (HCC)   ? Vitamin D deficiency   ? Wears glasses   ? ?Past Surgical History:  ?Procedure Laterality Date  ? COLONOSCOPY  05/10/2011  ? DIAGNOSTIC MAMMOGRAM  10 /09/2010  ? ORIF HUMERUS FRACTURE Left 08/18/2017  ? Procedure: OPEN REDUCTION INTERNAL FIXATION (ORIF) LEFT PROXIMAL HUMERUS FRACTURE;  Surgeon: Meredith Pel, MD;  Location: [place];  Service: Orthopedics;  Laterality: Left;  ? TIBIA FRACTURE SURGERY  04/2006  ? Left--From falling down the stairs  ? TIBIA FRACTURE SURGERY    ? In her 38's  ? ?Patient Active Problem List  ? Diagnosis Date Noted  ? Trochanteric bursitis, right hip 01/26/2022  ? Shoulder fracture, left 08/18/2017  ? Paroxysmal A-fib ([place]) 09/08/2012  ? ? ?REFERRING DIAG: M70.61 (ICD-10-CM) - Trochanteric bursitis, right hip  ? ?THERAPY DIAG:  ?Pain in right hip ? ?Muscle weakness (generalized) ? ?Difficulty in walking, not elsewhere classified ? ?PERTINENT HISTORY: see above ? ?PRECAUTIONS: none ? ?SUBJECTIVE: Patient reports she was able to do her  HEP but had to reduce the hold time due to discomfort.  "But I was also doing them on the floor and I think I will do them on the bed instead"   ? ?PAIN:  ?Are you having pain? Yes: NPRS scale: 0/10 ?Pain location: none this morning but typically right lateral hip ?Pain description: aching ?Aggravating factors: walking ?Relieving factors: rest, meds ? ? ?OBJECTIVE: (objective measures completed at initial evaluation unless otherwise dated) ? ? ?OBJECTIVE:  ?  ?DIAGNOSTIC FINDINGS: XR HIP UNILAT W OR W/O PELVIS 1V RIGHT (Nov 25, 2021)  ?An AP pelvis lateral right hip shows no acute findings or evidence of  ?            stress fracture.  The hip joint space is well-maintained.  ?  ?PATIENT SURVEYS:  ?FOTO 12 (goal 13) ?  ?COGNITION: ?          Overall cognitive status: Within functional limits for tasks assessed               ?           ?SENSATION: ?WFL ?  ?MUSCLE LENGTH: ?Hamstrings: Right 60 deg; Left 60 deg ?Thomas test: Right neg ; Left neg  ?  ?POSTURE:  ?Trunk shift to right ?  ?PALPATION: ?Palpable tenderness ?  ?LE ROM: ?  ?  WNL ?  ?LE MMT: ?  ?Generally 4/5 with exception of hip abduction 3+/5 and hip extension 3+/5 bilaterally ?  ?LOWER EXTREMITY SPECIAL TESTS:  ?Hip special tests: Saralyn Pilar (FABER) test: negative and Ober's test: positive  ?  ?  ?  ?GAIT: ?Distance walked: 50 ?Assistive device utilized: None ?Level of assistance: Complete Independence ?Comments: antalgic ?  ?  ?  ?TODAY'S TREATMENT:  01/29/22 ?NuStep x 5 min level 5 ?Reviewed HEP: Supine HS and ITB stretch, seated piriformis stretch and SL hip abuction ?Added to HEP: ?Supine SLR 2 sets of 10 ?SL hip adduction 2 sets of 10 ?Prone hip extension 2 sets of 10 ?Instructed patient in SLS and proper balance strategies using foot, ankle, knee, hip and core ? ?TODAY'S TREATMENT: ?Initial eval completed and initiated HEP ?  ?  ?PATIENT EDUCATION:  ?Education details: Initiated HEP ?Person educated: Patient ?Education method: Explanation, Demonstration,  Verbal cues, and Handouts ?Education comprehension: verbalized understanding, returned demonstration, and verbal cues required ?  ?  ?HOME EXERCISE PROGRAM: ?Access Code: [email or web address] ?URL: [email or web address] ?Date: 01/29/2022 ?Prepared by: Candyce Churn ? ?Program Notes ?All stretches hold 30 seconds.  Strengthening exercises: can start with 10 reps and increase to 2 sets of 10.   ? ?Exercises ?- Supine ITB Stretch with Strap  - 1 x daily - 7 x weekly - 1 sets - 3 reps ?- Supine Hamstring Stretch with Strap  - 1 x daily - 7 x weekly - 1 sets - 3 reps ?- Small Range Straight Leg Raise  - 1 x daily - 7 x weekly - 2 sets - 10 reps ?- Sidelying Hip Abduction  - 1 x daily - 7 x weekly - 2 sets - 10 reps ?- Prone Hip Extension  - 1 x daily - 7 x weekly - 2 sets - 10 reps ?- Sidelying Hip Adduction  - 1 x daily - 7 x weekly - 2 sets - 10 reps ?- Seated Piriformis Stretch with Trunk Bend  - 1 x daily - 7 x weekly - 1 sets - 3 repsAccess Code: [email or web address] ?URL: [email or web address] ?Date: 01/26/2022 ?Prepared by: Candyce Churn ?  ?Exercises ?- Supine ITB Stretch with Strap  - 1 x daily - 7 x weekly - 3 sets - 10 reps ?- Supine Hamstring Stretch with Strap  - 1 x daily - 7 x weekly - 3 sets - 10 reps ?- Sidelying Hip Abduction  - 1 x daily - 7 x weekly - 3 sets - 10 reps ?- Seated Piriformis Stretch with Trunk Bend  - 1 x daily - 7 x weekly - 3 sets - 10 reps ?  ?ASSESSMENT: ?  ?CLINICAL IMPRESSION: ?Patient needed moderate verbal and tactile cues for proper technique on HEP. She was able to tolerated addition of 4 way hip but could only do one set of 10.  She was advised to do HEP on bed vs. Floor to avoid injury or strain from getting up and down from floor.    She would benefit from LE flexibility and hip strengthening to restore proper hip mechanics and reduce hip pain.   ?  ?  ?OBJECTIVE IMPAIRMENTS Abnormal gait, decreased balance, decreased mobility, difficulty walking, decreased ROM,  decreased strength, increased fascial restrictions, increased muscle spasms, impaired flexibility, and pain.  ?  ?ACTIVITY LIMITATIONS cleaning, community activity, driving, meal prep, laundry, yard work, and shopping.  ?  ?PERSONAL FACTORS Age and Fitness are also affecting patient's functional outcome.  ?  ?  ?  REHAB POTENTIAL: Good ?  ?CLINICAL DECISION MAKING: Stable/uncomplicated ?  ?EVALUATION COMPLEXITY: Low ?  ?  ?GOALS: ?Goals reviewed with patient? Yes ?  ?SHORT TERM GOALS: Target date: 02/23/2022 ?  ?Pain report to be no greater than 4/10  ?Baseline: ?Goal status: INITIAL ?  ?2.  Patient will be independent with initial HEP  ?Baseline:  ?Goal status: INITIAL ?  ?3.  Patient to be able to walk 300 feet without need to stop and rest due to pain ?Baseline:  ?Goal status: INITIAL ?  ?  ?LONG TERM GOALS: Target date: 03/23/2022 ?  ?Patient to be independent with advanced HEP  ?Baseline:  ?Goal status: INITIAL ?  ?2.  Patient to report pain no greater than 2/10  ?Baseline:  ?Goal status: INITIAL ?  ?3.  FOTO to be 76 ?Baseline:  ?Goal status: INITIAL ?  ?4.  Patient to report 75% improved confidence with walking and balance.   ?Baseline:  ?Goal status: INITIAL ?  ?5.  Patient to be able to walk or stand for 30 min without need to stop and rest due to pain ?Baseline:  ?Goal status: INITIAL ?  ?  ?  ?  ?PLAN: ?PT FREQUENCY: 2x/week ?  ?PT DURATION: 8 weeks ?  ?PLANNED INTERVENTIONS: Therapeutic exercises, Therapeutic activity, Neuromuscular re-education, Balance training, Gait training, Patient/Family education, Joint mobilization, Stair training, DME instructions, Aquatic Therapy, Dry Needling, Electrical stimulation, Cryotherapy, Moist heat, Taping, Ultrasound, and Manual therapy ?  ?PLAN FOR NEXT SESSION: NuStep, balance training, progress hip strengthening and stability training, possibly add core strengthening  ?  ?Anderson Malta B. Aikam Hellickson, PT ?01/29/22 9:44 AM  ? ?   ?

## 2022-02-09 ENCOUNTER — Ambulatory Visit: Payer: PPO

## 2022-02-09 DIAGNOSIS — M6281 Muscle weakness (generalized): Secondary | ICD-10-CM

## 2022-02-09 DIAGNOSIS — R2689 Other abnormalities of gait and mobility: Secondary | ICD-10-CM

## 2022-02-09 DIAGNOSIS — R42 Dizziness and giddiness: Secondary | ICD-10-CM

## 2022-02-09 DIAGNOSIS — R2681 Unsteadiness on feet: Secondary | ICD-10-CM

## 2022-02-09 DIAGNOSIS — R262 Difficulty in walking, not elsewhere classified: Secondary | ICD-10-CM

## 2022-02-09 DIAGNOSIS — M25551 Pain in right hip: Secondary | ICD-10-CM

## 2022-02-09 NOTE — Therapy (Signed)
OUTPATIENT PHYSICAL THERAPY TREATMENT NOTE   Patient Name: Alyssa Martin MRN: [id number] DOB:[birth year], 76 y.o., female Today's Date: 02/09/2022  PCP: Ginger Organ., MD REFERRING PROVIDER: Mcarthur Rossetti, MD  END OF SESSION:   PT End of Session - 02/09/22 0935     Visit Number 3    Date for PT Re-Evaluation 03/23/22    Authorization Type [employer]    PT Start Time 0845    PT Stop Time 0935    PT Time Calculation (min) 50 min    Activity Tolerance Patient tolerated treatment well    Behavior During Therapy [facility] for tasks assessed/performed             Past Medical History:  Diagnosis Date   Arthritis    Closed fracture of left proximal humerus    Closed right ankle fracture    Depression with anxiety    Elevated BP    GERD (gastroesophageal reflux disease)    Hyperlipidemia    Hypertension    Impaired fasting glucose    Osteopenia    Osteoporosis    Paroxysmal atrial fibrillation ([place])    Vitamin D deficiency    Wears glasses    Past Surgical History:  Procedure Laterality Date   COLONOSCOPY  05/10/2011   DIAGNOSTIC MAMMOGRAM  10 /09/2010   ORIF HUMERUS FRACTURE Left 08/18/2017   Procedure: OPEN REDUCTION INTERNAL FIXATION (ORIF) LEFT PROXIMAL HUMERUS FRACTURE;  Surgeon: Meredith Pel, MD;  Location: [place];  Service: Orthopedics;  Laterality: Left;   TIBIA FRACTURE SURGERY  04/2006   Left--From falling down the stairs   TIBIA FRACTURE SURGERY     In her 40's   Patient Active Problem List   Diagnosis Date Noted   Trochanteric bursitis, right hip 01/26/2022   Shoulder fracture, left 08/18/2017   Paroxysmal A-fib ([place]) 09/08/2012    REFERRING DIAG: M70.61 (ICD-10-CM) - Trochanteric bursitis, right hip   THERAPY DIAG:  Pain in right hip  Muscle weakness (generalized)  Difficulty in walking, not elsewhere classified  Dizziness and giddiness  Other abnormalities of gait and mobility  Unsteadiness on  feet  PERTINENT HISTORY: see above  PRECAUTIONS: none  SUBJECTIVE: Patient reports she was on vacation and did really well.  She reports her pain level at 2/10.    PAIN:  Are you having pain? Yes: NPRS scale: 2/10 Pain location:  right lateral hip Pain description: discomfort Aggravating factors: walking Relieving factors: rest, meds   OBJECTIVE: (objective measures completed at initial evaluation unless otherwise dated)   OBJECTIVE:    DIAGNOSTIC FINDINGS: XR HIP UNILAT W OR W/O PELVIS 1V RIGHT (Nov 25, 2021)  An AP pelvis lateral right hip shows no acute findings or evidence of              stress fracture.  The hip joint space is well-maintained.    PATIENT SURVEYS:  FOTO 66 (goal 58)   COGNITION:           Overall cognitive status: Within functional limits for tasks assessed                          SENSATION: WFL   MUSCLE LENGTH: Hamstrings: Right 60 deg; Left 60 deg Thomas test: Right neg ; Left neg    POSTURE:  Trunk shift to right   PALPATION: Palpable tenderness   LE ROM:   WNL   LE MMT:   Generally 4/5 with exception  of hip abduction 3+/5 and hip extension 3+/5 bilaterally   LOWER EXTREMITY SPECIAL TESTS:  Hip special tests: Saralyn Pilar (FABER) test: negative and Ober's test: positive        GAIT: Distance walked: 50 Assistive device utilized: None Level of assistance: Complete Independence Comments: antalgic       TODAY'S TREATMENT:  02/09/22 [employer]ant bike x 5 min level 1 Step up and hold on balance pad x 20 each LE without UE support Lateral band walks 3 laps of 10 steps with green loop  Cone touches 3 sets of 10 each LE (cone on balance pad on mat table) foot on floor 1 D ball toss (floor) red plyo ball x 10 each Supine hamstring stretch 2 x 30 sec Supine IT band stretch  2 x 30 sec each  TODAY'S TREATMENT:  01/29/22 NuStep x 5 min level 5 Reviewed HEP: Supine HS and ITB stretch, seated piriformis stretch and SL hip abuction Added to  HEP: Supine SLR 2 sets of 10 SL hip adduction 2 sets of 10 Prone hip extension 2 sets of 10 Instructed patient in SLS and proper balance strategies using foot, ankle, knee, hip and core  TODAY'S TREATMENT: Initial eval completed and initiated HEP     PATIENT EDUCATION:  Education details: Initiated HEP Person educated: Patient Education method: Consulting civil engineer, Media planner, Verbal cues, and Handouts Education comprehension: verbalized understanding, returned demonstration, and verbal cues required     HOME EXERCISE PROGRAM: Access Code: [email or web address] URL: [email or web address] Date: 01/29/2022 Prepared by: Candyce Churn  Program Notes All stretches hold 30 seconds.  Strengthening exercises: can start with 10 reps and increase to 2 sets of 10.    Exercises - Supine ITB Stretch with Strap  - 1 x daily - 7 x weekly - 1 sets - 3 reps - Supine Hamstring Stretch with Strap  - 1 x daily - 7 x weekly - 1 sets - 3 reps - Small Range Straight Leg Raise  - 1 x daily - 7 x weekly - 2 sets - 10 reps - Sidelying Hip Abduction  - 1 x daily - 7 x weekly - 2 sets - 10 reps - Prone Hip Extension  - 1 x daily - 7 x weekly - 2 sets - 10 reps - Sidelying Hip Adduction  - 1 x daily - 7 x weekly - 2 sets - 10 reps - Seated Piriformis Stretch with Trunk Bend  - 1 x daily - 7 x weekly - 1 sets - 3 repsAccess Code: [email or web address] URL: [email or web address] Date: 01/26/2022 Prepared by: Candyce Churn   Exercises - Supine ITB Stretch with Strap  - 1 x daily - 7 x weekly - 3 sets - 10 reps - Supine Hamstring Stretch with Strap  - 1 x daily - 7 x weekly - 3 sets - 10 reps - Sidelying Hip Abduction  - 1 x daily - 7 x weekly - 3 sets - 10 reps - Seated Piriformis Stretch with Trunk Bend  - 1 x daily - 7 x weekly - 3 sets - 10 reps   ASSESSMENT:   CLINICAL IMPRESSION: Patient is progressing appropriately.  She has difficulty with balance tasks on her left LE due to hx of left foot  fracture.  She is doing well with regard to pain, reporting a 2/10 today.    She would benefit from LE flexibility and hip strengthening to restore proper hip mechanics and reduce hip pain.       OBJECTIVE IMPAIRMENTS Abnormal gait,  decreased balance, decreased mobility, difficulty walking, decreased ROM, decreased strength, increased fascial restrictions, increased muscle spasms, impaired flexibility, and pain.    ACTIVITY LIMITATIONS cleaning, community activity, driving, meal prep, laundry, yard work, and shopping.    PERSONAL FACTORS Age and Fitness are also affecting patient's functional outcome.      REHAB POTENTIAL: Good   CLINICAL DECISION MAKING: Stable/uncomplicated   EVALUATION COMPLEXITY: Low     GOALS: Goals reviewed with patient? Yes   SHORT TERM GOALS: Target date: 02/23/2022   Pain report to be no greater than 4/10  Baseline: Goal status: INITIAL   2.  Patient will be independent with initial HEP  Baseline:  Goal status: INITIAL   3.  Patient to be able to walk 300 feet without need to stop and rest due to pain Baseline:  Goal status: INITIAL     LONG TERM GOALS: Target date: 03/23/2022   Patient to be independent with advanced HEP  Baseline:  Goal status: INITIAL   2.  Patient to report pain no greater than 2/10  Baseline:  Goal status: INITIAL   3.  FOTO to be 76 Baseline:  Goal status: INITIAL   4.  Patient to report 75% improved confidence with walking and balance.   Baseline:  Goal status: INITIAL   5.  Patient to be able to walk or stand for 30 min without need to stop and rest due to pain Baseline:  Goal status: INITIAL         PLAN: PT FREQUENCY: 2x/week   PT DURATION: 8 weeks   PLANNED INTERVENTIONS: Therapeutic exercises, Therapeutic activity, Neuromuscular re-education, Balance training, Gait training, Patient/Family education, Joint mobilization, Stair training, DME instructions, Aquatic Therapy, Dry Needling, Electrical  stimulation, Cryotherapy, Moist heat, Taping, Ultrasound, and Manual therapy   PLAN FOR NEXT SESSION: NuStep, balance training, progress hip strengthening and stability training, possibly add core strengthening    Kindsey Eblin B. Adonis Ryther, PT 02/09/22 11:48 AM

## 2022-02-12 ENCOUNTER — Ambulatory Visit: Payer: PPO

## 2022-02-12 DIAGNOSIS — M6281 Muscle weakness (generalized): Secondary | ICD-10-CM

## 2022-02-12 DIAGNOSIS — R262 Difficulty in walking, not elsewhere classified: Secondary | ICD-10-CM

## 2022-02-12 DIAGNOSIS — R2681 Unsteadiness on feet: Secondary | ICD-10-CM

## 2022-02-12 DIAGNOSIS — M25551 Pain in right hip: Secondary | ICD-10-CM

## 2022-02-12 NOTE — Therapy (Signed)
OUTPATIENT PHYSICAL THERAPY TREATMENT NOTE   Patient Name: Alyssa Martin MRN: [id number] DOB:Jul 24, 1946, 76 y.o., female Today's Date: 02/12/2022  PCP: Ginger Organ., MD REFERRING PROVIDER: Mcarthur Rossetti, MD  END OF SESSION:   PT End of Session - 02/12/22 0859     Visit Number 4    Date for PT Re-Evaluation 03/23/22    Authorization Type [employer]    Progress Note Due on Visit 10    PT Start Time 0855    PT Stop Time 0930    PT Time Calculation (min) 35 min    Activity Tolerance Patient tolerated treatment well    Behavior During Therapy [facility] for tasks assessed/performed             Past Medical History:  Diagnosis Date   Arthritis    Closed fracture of left proximal humerus    Closed right ankle fracture    Depression with anxiety    Elevated BP    GERD (gastroesophageal reflux disease)    Hyperlipidemia    Hypertension    Impaired fasting glucose    Osteopenia    Osteoporosis    Paroxysmal atrial fibrillation ([place])    Vitamin D deficiency    Wears glasses    Past Surgical History:  Procedure Laterality Date   COLONOSCOPY  05/10/2011   DIAGNOSTIC MAMMOGRAM  10 /09/2010   ORIF HUMERUS FRACTURE Left 08/18/2017   Procedure: OPEN REDUCTION INTERNAL FIXATION (ORIF) LEFT PROXIMAL HUMERUS FRACTURE;  Surgeon: Meredith Pel, MD;  Location: [place];  Service: Orthopedics;  Laterality: Left;   TIBIA FRACTURE SURGERY  04/2006   Left--From falling down the stairs   TIBIA FRACTURE SURGERY     In her 40's   Patient Active Problem List   Diagnosis Date Noted   Trochanteric bursitis, right hip 01/26/2022   Shoulder fracture, left 08/18/2017   Paroxysmal A-fib ([place]) 09/08/2012    REFERRING DIAG: M70.61 (ICD-10-CM) - Trochanteric bursitis, right hip   THERAPY DIAG:  Pain in right hip  Muscle weakness (generalized)  Difficulty in walking, not elsewhere classified  Unsteadiness on feet  PERTINENT HISTORY: see above  PRECAUTIONS:  none  SUBJECTIVE: Patient reports she is doing ok.  I am having a little bit of the right hip pain.     PAIN:  Are you having pain? Yes: NPRS scale: 2/10 Pain location:  right lateral hip Pain description: discomfort Aggravating factors: walking Relieving factors: rest, meds   OBJECTIVE: (objective measures completed at initial evaluation unless otherwise dated)   OBJECTIVE:    DIAGNOSTIC FINDINGS: XR HIP UNILAT W OR W/O PELVIS 1V RIGHT (Nov 25, 2021)  An AP pelvis lateral right hip shows no acute findings or evidence of              stress fracture.  The hip joint space is well-maintained.    PATIENT SURVEYS:  FOTO 66 (goal 32)   COGNITION:           Overall cognitive status: Within functional limits for tasks assessed                          SENSATION: WFL   MUSCLE LENGTH: Hamstrings: Right 60 deg; Left 60 deg Thomas test: Right neg ; Left neg    POSTURE:  Trunk shift to right   PALPATION: Palpable tenderness   LE ROM:   WNL   LE MMT:   Generally 4/5 with exception of  hip abduction 3+/5 and hip extension 3+/5 bilaterally   LOWER EXTREMITY SPECIAL TESTS:  Hip special tests: Saralyn Pilar (FABER) test: negative and Ober's test: positive        GAIT: Distance walked: 50 Assistive device utilized: None Level of assistance: Complete Independence Comments: antalgic       TODAY'S TREATMENT:  02/12/22 Recumbant bike x 5 min level 1 Step up and hold on balance pad x 20 each LE without UE support Lateral band walks 3 laps of 10 steps with green loop  Cone touches 3 sets of 10 each LE (cone on mat table) foot on floor 1 D ball toss (floor) red plyo ball x 10 each Supine hamstring stretch 2 x 30 sec Supine IT band stretch  2 x 30 sec each Seated piriformis stretch 2 x 30 sec TODAY'S TREATMENT:  02/09/22 Recumbant bike x 5 min level 1 Step up and hold on balance pad x 20 each LE without UE support Lateral band walks 3 laps of 10 steps with green loop  Cone  touches 3 sets of 10 each LE (cone on balance pad on mat table) foot on floor 1 D ball toss (floor) red plyo ball x 10 each Supine hamstring stretch 2 x 30 sec Supine IT band stretch  2 x 30 sec each  TODAY'S TREATMENT:  01/29/22 NuStep x 5 min level 5 Reviewed HEP: Supine HS and ITB stretch, seated piriformis stretch and SL hip abuction Added to HEP: Supine SLR 2 sets of 10 SL hip adduction 2 sets of 10 Prone hip extension 2 sets of 10 Instructed patient in SLS and proper balance strategies using foot, ankle, knee, hip and core  TODAY'S TREATMENT: Initial eval completed and initiated HEP     PATIENT EDUCATION:  Education details: Initiated HEP Person educated: Patient Education method: Consulting civil engineer, Media planner, Verbal cues, and Handouts Education comprehension: verbalized understanding, returned demonstration, and verbal cues required     HOME EXERCISE PROGRAM: Access Code: [email or web address] URL: [email or web address] Date: 01/29/2022 Prepared by: Candyce Churn  Program Notes All stretches hold 30 seconds.  Strengthening exercises: can start with 10 reps and increase to 2 sets of 10.    Exercises - Supine ITB Stretch with Strap  - 1 x daily - 7 x weekly - 1 sets - 3 reps - Supine Hamstring Stretch with Strap  - 1 x daily - 7 x weekly - 1 sets - 3 reps - Small Range Straight Leg Raise  - 1 x daily - 7 x weekly - 2 sets - 10 reps - Sidelying Hip Abduction  - 1 x daily - 7 x weekly - 2 sets - 10 reps - Prone Hip Extension  - 1 x daily - 7 x weekly - 2 sets - 10 reps - Sidelying Hip Adduction  - 1 x daily - 7 x weekly - 2 sets - 10 reps - Seated Piriformis Stretch with Trunk Bend  - 1 x daily - 7 x weekly - 1 sets - 3 repsAccess Code: [email or web address] URL: [email or web address] Date: 01/26/2022 Prepared by: Candyce Churn   Exercises - Supine ITB Stretch with Strap  - 1 x daily - 7 x weekly - 3 sets - 10 reps - Supine Hamstring Stretch with Strap  - 1 x  daily - 7 x weekly - 3 sets - 10 reps - Sidelying Hip Abduction  - 1 x daily - 7 x weekly - 3 sets - 10 reps - Seated Piriformis Stretch with Trunk Bend  -  1 x daily - 7 x weekly - 3 sets - 10 reps   ASSESSMENT:   CLINICAL IMPRESSION: Patient was 10 min late today but we were able to complete all tasks.  She is beginning to learn hip strategies on single leg balance tasks and was able to do 10 consecutive cone touches on right and 5 consecutive on left.  She was able to do band walks with less fatigue as well.    She would benefit from LE flexibility and hip strengthening to restore proper hip mechanics and reduce hip pain.       OBJECTIVE IMPAIRMENTS Abnormal gait, decreased balance, decreased mobility, difficulty walking, decreased ROM, decreased strength, increased fascial restrictions, increased muscle spasms, impaired flexibility, and pain.    ACTIVITY LIMITATIONS cleaning, community activity, driving, meal prep, laundry, yard work, and shopping.    PERSONAL FACTORS Age and Fitness are also affecting patient's functional outcome.      REHAB POTENTIAL: Good   CLINICAL DECISION MAKING: Stable/uncomplicated   EVALUATION COMPLEXITY: Low     GOALS: Goals reviewed with patient? Yes   SHORT TERM GOALS: Target date: 02/23/2022   Pain report to be no greater than 4/10  Baseline: Goal status: INITIAL   2.  Patient will be independent with initial HEP  Baseline:  Goal status: INITIAL   3.  Patient to be able to walk 300 feet without need to stop and rest due to pain Baseline:  Goal status: INITIAL     LONG TERM GOALS: Target date: 03/23/2022   Patient to be independent with advanced HEP  Baseline:  Goal status: INITIAL   2.  Patient to report pain no greater than 2/10  Baseline:  Goal status: INITIAL   3.  FOTO to be 76 Baseline:  Goal status: INITIAL   4.  Patient to report 75% improved confidence with walking and balance.   Baseline:  Goal status: INITIAL   5.   Patient to be able to walk or stand for 30 min without need to stop and rest due to pain Baseline:  Goal status: INITIAL         PLAN: PT FREQUENCY: 2x/week   PT DURATION: 8 weeks   PLANNED INTERVENTIONS: Therapeutic exercises, Therapeutic activity, Neuromuscular re-education, Balance training, Gait training, Patient/Family education, Joint mobilization, Stair training, DME instructions, Aquatic Therapy, Dry Needling, Electrical stimulation, Cryotherapy, Moist heat, Taping, Ultrasound, and Manual therapy   PLAN FOR NEXT SESSION: NuStep, balance training, progress hip strengthening and stability training, possibly add core strengthening    Nimisha Rathel B. Nason Conradt, PT 02/12/22 9:31 AM

## 2022-02-16 ENCOUNTER — Ambulatory Visit: Payer: PPO

## 2022-02-16 DIAGNOSIS — R262 Difficulty in walking, not elsewhere classified: Secondary | ICD-10-CM

## 2022-02-16 DIAGNOSIS — M6281 Muscle weakness (generalized): Secondary | ICD-10-CM

## 2022-02-16 DIAGNOSIS — R2681 Unsteadiness on feet: Secondary | ICD-10-CM

## 2022-02-16 DIAGNOSIS — M25551 Pain in right hip: Secondary | ICD-10-CM

## 2022-02-16 NOTE — Therapy (Signed)
OUTPATIENT PHYSICAL THERAPY TREATMENT NOTE   Patient Name: Alyssa Martin MRN: [id number] DOB:Jan 12, 1946, 76 y.o., female Today's Date: 02/16/2022  PCP: Ginger Organ., MD REFERRING PROVIDER: Mcarthur Rossetti, MD  END OF SESSION:   PT End of Session - 02/16/22 [phone]     Visit Number 5    Date for PT Re-Evaluation 03/23/22    Authorization Type [employer]    Progress Note Due on Visit 10    PT Start Time 0934    PT Stop Time 1015    PT Time Calculation (min) 41 min    Activity Tolerance Patient tolerated treatment well    Behavior During Therapy [facility] for tasks assessed/performed             Past Medical History:  Diagnosis Date   Arthritis    Closed fracture of left proximal humerus    Closed right ankle fracture    Depression with anxiety    Elevated BP    GERD (gastroesophageal reflux disease)    Hyperlipidemia    Hypertension    Impaired fasting glucose    Osteopenia    Osteoporosis    Paroxysmal atrial fibrillation ([place])    Vitamin D deficiency    Wears glasses    Past Surgical History:  Procedure Laterality Date   COLONOSCOPY  05/10/2011   DIAGNOSTIC MAMMOGRAM  10 /09/2010   ORIF HUMERUS FRACTURE Left 08/18/2017   Procedure: OPEN REDUCTION INTERNAL FIXATION (ORIF) LEFT PROXIMAL HUMERUS FRACTURE;  Surgeon: Meredith Pel, MD;  Location: [place];  Service: Orthopedics;  Laterality: Left;   TIBIA FRACTURE SURGERY  04/2006   Left--From falling down the stairs   TIBIA FRACTURE SURGERY     In her 40's   Patient Active Problem List   Diagnosis Date Noted   Trochanteric bursitis, right hip 01/26/2022   Shoulder fracture, left 08/18/2017   Paroxysmal A-fib ([place]) 09/08/2012    REFERRING DIAG: M70.61 (ICD-10-CM) - Trochanteric bursitis, right hip   THERAPY DIAG:  Pain in right hip  Muscle weakness (generalized)  Difficulty in walking, not elsewhere classified  Unsteadiness on feet  PERTINENT HISTORY: see above  PRECAUTIONS:  none  SUBJECTIVE: Patient reports she is doing pretty good.   I still have some mild pain in my right hip (patient refers to groin area).    PAIN:  Are you having pain? Yes: NPRS scale: 2/10 Pain location:  right lateral hip Pain description: discomfort Aggravating factors: walking Relieving factors: rest, meds   OBJECTIVE: (objective measures completed at initial evaluation unless otherwise dated)   OBJECTIVE:    DIAGNOSTIC FINDINGS: XR HIP UNILAT W OR W/O PELVIS 1V RIGHT (Nov 25, 2021)  An AP pelvis lateral right hip shows no acute findings or evidence of              stress fracture.  The hip joint space is well-maintained.    PATIENT SURVEYS:  FOTO 66 (goal 68)   COGNITION:           Overall cognitive status: Within functional limits for tasks assessed                          SENSATION: WFL   MUSCLE LENGTH: Hamstrings: Right 60 deg; Left 60 deg Thomas test: Right neg ; Left neg    POSTURE:  Trunk shift to right   PALPATION: Palpable tenderness   LE ROM:   WNL   LE MMT:  Generally 4/5 with exception of hip abduction 3+/5 and hip extension 3+/5 bilaterally   LOWER EXTREMITY SPECIAL TESTS:  Hip special tests: Saralyn Pilar (FABER) test: negative and Ober's test: positive        GAIT: Distance walked: 50 Assistive device utilized: None Level of assistance: Complete Independence Comments: antalgic       TODAY'S TREATMENT:  02/16/22 Seated butterfly stretch 3 x 20 sec Supine figure 4 stretch right hip IR's Piriformis stretch 3 x 20 sec right Supine IT band stretch  3 x 20 sec each 4 way hip strengthening with 2 lb Sidelying clam with green loop x 20 Hooklying clam x 20 Step up and hold on balance pad x 20 each LE without UE support Lateral band walks 3 laps of 10 steps with green loop  Cone touches 3 sets of 10 each LE (cone on mat table) foot on floor  TODAY'S TREATMENT:  02/12/22 Recumbant bike x 5 min level 1 Step up and hold on balance pad x 20  each LE without UE support Lateral band walks 3 laps of 10 steps with green loop  Cone touches 3 sets of 10 each LE (cone on mat table) foot on floor 1 D ball toss (floor) red plyo ball x 10 each Supine hamstring stretch 2 x 30 sec Supine IT band stretch  2 x 30 sec each Seated piriformis stretch 2 x 30 sec TODAY'S TREATMENT:  02/09/22 Recumbant bike x 5 min level 1 Step up and hold on balance pad x 20 each LE without UE support Lateral band walks 3 laps of 10 steps with green loop  Cone touches 3 sets of 10 each LE (cone on balance pad on mat table) foot on floor 1 D ball toss (floor) red plyo ball x 10 each Supine hamstring stretch 2 x 30 sec Supine IT band stretch  2 x 30 sec each      PATIENT EDUCATION:  Education details: Initiated HEP Person educated: Patient Education method: Consulting civil engineer, Media planner, Verbal cues, and Handouts Education comprehension: verbalized understanding, returned demonstration, and verbal cues required     HOME EXERCISE PROGRAM: Access Code: [email or web address] URL: [email or web address] Date: 01/29/2022 Prepared by: Candyce Churn  Program Notes All stretches hold 30 seconds.  Strengthening exercises: can start with 10 reps and increase to 2 sets of 10.    Exercises - Supine ITB Stretch with Strap  - 1 x daily - 7 x weekly - 1 sets - 3 reps - Supine Hamstring Stretch with Strap  - 1 x daily - 7 x weekly - 1 sets - 3 reps - Small Range Straight Leg Raise  - 1 x daily - 7 x weekly - 2 sets - 10 reps - Sidelying Hip Abduction  - 1 x daily - 7 x weekly - 2 sets - 10 reps - Prone Hip Extension  - 1 x daily - 7 x weekly - 2 sets - 10 reps - Sidelying Hip Adduction  - 1 x daily - 7 x weekly - 2 sets - 10 reps - Seated Piriformis Stretch with Trunk Bend  - 1 x daily - 7 x weekly - 1 sets - 3 repsAccess Code: [email or web address] URL: [email or web address] Date: 01/26/2022 Prepared by: Candyce Churn   Exercises - Supine ITB Stretch with  Strap  - 1 x daily - 7 x weekly - 3 sets - 10 reps - Supine Hamstring Stretch with Strap  - 1 x daily - 7 x weekly - 3 sets - 10  reps - Sidelying Hip Abduction  - 1 x daily - 7 x weekly - 3 sets - 10 reps - Seated Piriformis Stretch with Trunk Bend  - 1 x daily - 7 x weekly - 3 sets - 10 reps   ASSESSMENT:   CLINICAL IMPRESSION: Patient is progressing appropriately.  She needed review of 4 way hip strengthening.  She is still quite weak in right hip abduction and ER.  Overall doing very well.  Demonstrates continued improvement with balance tasks as well.     She would benefit from LE flexibility and hip strengthening to restore proper hip mechanics and reduce hip pain.       OBJECTIVE IMPAIRMENTS Abnormal gait, decreased balance, decreased mobility, difficulty walking, decreased ROM, decreased strength, increased fascial restrictions, increased muscle spasms, impaired flexibility, and pain.    ACTIVITY LIMITATIONS cleaning, community activity, driving, meal prep, laundry, yard work, and shopping.    PERSONAL FACTORS Age and Fitness are also affecting patient's functional outcome.      REHAB POTENTIAL: Good   CLINICAL DECISION MAKING: Stable/uncomplicated   EVALUATION COMPLEXITY: Low     GOALS: Goals reviewed with patient? Yes   SHORT TERM GOALS: Target date: 02/23/2022   Pain report to be no greater than 4/10  Baseline: Goal status: INITIAL   2.  Patient will be independent with initial HEP  Baseline:  Goal status: INITIAL   3.  Patient to be able to walk 300 feet without need to stop and rest due to pain Baseline:  Goal status: INITIAL     LONG TERM GOALS: Target date: 03/23/2022   Patient to be independent with advanced HEP  Baseline:  Goal status: INITIAL   2.  Patient to report pain no greater than 2/10  Baseline:  Goal status: INITIAL   3.  FOTO to be 76 Baseline:  Goal status: INITIAL   4.  Patient to report 75% improved confidence with walking and balance.    Baseline:  Goal status: INITIAL   5.  Patient to be able to walk or stand for 30 min without need to stop and rest due to pain Baseline:  Goal status: INITIAL         PLAN: PT FREQUENCY: 2x/week   PT DURATION: 8 weeks   PLANNED INTERVENTIONS: Therapeutic exercises, Therapeutic activity, Neuromuscular re-education, Balance training, Gait training, Patient/Family education, Joint mobilization, Stair training, DME instructions, Aquatic Therapy, Dry Needling, Electrical stimulation, Cryotherapy, Moist heat, Taping, Ultrasound, and Manual therapy   PLAN FOR NEXT SESSION: NuStep, balance training, progress hip strengthening and stability training, possibly add core strengthening    Ahmir Bracken B. Tametria Aho, PT 02/16/22 11:06 AM

## 2022-02-18 ENCOUNTER — Ambulatory Visit: Payer: PPO | Attending: Orthopaedic Surgery

## 2022-02-18 DIAGNOSIS — R42 Dizziness and giddiness: Secondary | ICD-10-CM | POA: Diagnosis not present

## 2022-02-18 DIAGNOSIS — R2689 Other abnormalities of gait and mobility: Secondary | ICD-10-CM

## 2022-02-18 DIAGNOSIS — R262 Difficulty in walking, not elsewhere classified: Secondary | ICD-10-CM | POA: Diagnosis not present

## 2022-02-18 DIAGNOSIS — R2681 Unsteadiness on feet: Secondary | ICD-10-CM

## 2022-02-18 DIAGNOSIS — M6281 Muscle weakness (generalized): Secondary | ICD-10-CM | POA: Diagnosis not present

## 2022-02-18 DIAGNOSIS — R252 Cramp and spasm: Secondary | ICD-10-CM | POA: Insufficient documentation

## 2022-02-18 DIAGNOSIS — M25551 Pain in right hip: Secondary | ICD-10-CM | POA: Diagnosis not present

## 2022-02-18 NOTE — Therapy (Signed)
OUTPATIENT PHYSICAL THERAPY TREATMENT NOTE   Patient Name: Alyssa Martin MRN: [id number] DOB:1945/10/10, 76 y.o., female Today's Date: 02/18/2022  PCP: Ginger Organ., MD REFERRING PROVIDER: Mcarthur Rossetti, MD  END OF SESSION:   PT End of Session - 02/18/22 0809     Visit Number 6    Date for PT Re-Evaluation 03/23/22    Authorization Type [employer]    PT Start Time 0800    PT Stop Time 0841    PT Time Calculation (min) 41 min    Activity Tolerance Patient tolerated treatment well    Behavior During Therapy [facility] for tasks assessed/performed             Past Medical History:  Diagnosis Date   Arthritis    Closed fracture of left proximal humerus    Closed right ankle fracture    Depression with anxiety    Elevated BP    GERD (gastroesophageal reflux disease)    Hyperlipidemia    Hypertension    Impaired fasting glucose    Osteopenia    Osteoporosis    Paroxysmal atrial fibrillation ([place])    Vitamin D deficiency    Wears glasses    Past Surgical History:  Procedure Laterality Date   COLONOSCOPY  05/10/2011   DIAGNOSTIC MAMMOGRAM  10 /09/2010   ORIF HUMERUS FRACTURE Left 08/18/2017   Procedure: OPEN REDUCTION INTERNAL FIXATION (ORIF) LEFT PROXIMAL HUMERUS FRACTURE;  Surgeon: Meredith Pel, MD;  Location: [place];  Service: Orthopedics;  Laterality: Left;   TIBIA FRACTURE SURGERY  04/2006   Left--From falling down the stairs   TIBIA FRACTURE SURGERY     In her 40's   Patient Active Problem List   Diagnosis Date Noted   Trochanteric bursitis, right hip 01/26/2022   Shoulder fracture, left 08/18/2017   Paroxysmal A-fib ([place]) 09/08/2012    REFERRING DIAG: M70.61 (ICD-10-CM) - Trochanteric bursitis, right hip   THERAPY DIAG:  Pain in right hip  Muscle weakness (generalized)  Difficulty in walking, not elsewhere classified  Unsteadiness on feet  Dizziness and giddiness  Other abnormalities of gait and mobility  PERTINENT  HISTORY: see above  PRECAUTIONS: none  SUBJECTIVE: Patient reports she is still having some mild pain but feels she is walking better and feels more secure when she is walking.      PAIN:  Are you having pain? Yes: NPRS scale: 2/10 Pain location:  right lateral hip Pain description: discomfort Aggravating factors: walking Relieving factors: rest, meds   OBJECTIVE: (objective measures completed at initial evaluation unless otherwise dated)   OBJECTIVE:    DIAGNOSTIC FINDINGS: XR HIP UNILAT W OR W/O PELVIS 1V RIGHT (Nov 25, 2021)  An AP pelvis lateral right hip shows no acute findings or evidence of              stress fracture.  The hip joint space is well-maintained.    PATIENT SURVEYS:  FOTO 66 (goal 58)   COGNITION:           Overall cognitive status: Within functional limits for tasks assessed                          SENSATION: WFL   MUSCLE LENGTH: Hamstrings: Right 60 deg; Left 60 deg Thomas test: Right neg ; Left neg    POSTURE:  Trunk shift to right   PALPATION: Palpable tenderness   LE ROM:   WNL   LE  MMT:   Generally 4/5 with exception of hip abduction 3+/5 and hip extension 3+/5 bilaterally   LOWER EXTREMITY SPECIAL TESTS:  Hip special tests: Saralyn Pilar (FABER) test: negative and Ober's test: positive        GAIT: Distance walked: 50 Assistive device utilized: None Level of assistance: Complete Independence Comments: antalgic       TODAY'S TREATMENT:  02/18/22 Seated butterfly stretch 3 x 20 sec Seated Piriformis stretch 3 x 20 sec right Supine figure 4 stretch right hip IR's 3 x 20 sec Hooklying right hip IR to stretch right hip ER's  3 x 20 sec Supine iron cross stretch 3 x 20 sec Supine IT band stretch  3 x 20 sec each 4 way hip strengthening with 2 lb Sidelying clam with green loop x 20 Hooklying clam x 20 Squats to mat table 2 x 10 with 5 lb kb Educated patient on various reasons for balance issues as we age including vestibular,  posture, weakness, etc.   TODAY'S TREATMENT:  02/16/22 Seated butterfly stretch 3 x 20 sec Supine figure 4 stretch right hip IR's Piriformis stretch 3 x 20 sec right Supine IT band stretch  3 x 20 sec each 4 way hip strengthening with 2 lb Sidelying clam with green loop x 20 Hooklying clam x 20 Step up and hold on balance pad x 20 each LE without UE support Lateral band walks 3 laps of 10 steps with green loop  Cone touches 3 sets of 10 each LE (cone on mat table) foot on floor  TODAY'S TREATMENT:  02/12/22 Recumbant bike x 5 min level 1 Step up and hold on balance pad x 20 each LE without UE support Lateral band walks 3 laps of 10 steps with green loop  Cone touches 3 sets of 10 each LE (cone on mat table) foot on floor 1 D ball toss (floor) red plyo ball x 10 each Supine hamstring stretch 2 x 30 sec Supine IT band stretch  2 x 30 sec each Seated piriformis stretch 2 x 30 sec TODAY'S TREATMENT:  02/09/22 Recumbant bike x 5 min level 1 Step up and hold on balance pad x 20 each LE without UE support Lateral band walks 3 laps of 10 steps with green loop  Cone touches 3 sets of 10 each LE (cone on balance pad on mat table) foot on floor 1 D ball toss (floor) red plyo ball x 10 each Supine hamstring stretch 2 x 30 sec Supine IT band stretch  2 x 30 sec each      PATIENT EDUCATION:  Education details: Initiated HEP Person educated: Patient Education method: Consulting civil engineer, Media planner, Verbal cues, and Handouts Education comprehension: verbalized understanding, returned demonstration, and verbal cues required     HOME EXERCISE PROGRAM: Access Code: [email or web address] URL: [email or web address] Date: 01/29/2022 Prepared by: Candyce Churn  Program Notes All stretches hold 30 seconds.  Strengthening exercises: can start with 10 reps and increase to 2 sets of 10.    Exercises - Supine ITB Stretch with Strap  - 1 x daily - 7 x weekly - 1 sets - 3 reps - Supine Hamstring  Stretch with Strap  - 1 x daily - 7 x weekly - 1 sets - 3 reps - Small Range Straight Leg Raise  - 1 x daily - 7 x weekly - 2 sets - 10 reps - Sidelying Hip Abduction  - 1 x daily - 7 x weekly - 2 sets - 10 reps - Prone  Hip Extension  - 1 x daily - 7 x weekly - 2 sets - 10 reps - Sidelying Hip Adduction  - 1 x daily - 7 x weekly - 2 sets - 10 reps - Seated Piriformis Stretch with Trunk Bend  - 1 x daily - 7 x weekly - 1 sets - 3 repsAccess Code: [email or web address] URL: [email or web address] Date: 01/26/2022 Prepared by: Candyce Churn   Exercises - Supine ITB Stretch with Strap  - 1 x daily - 7 x weekly - 3 sets - 10 reps - Supine Hamstring Stretch with Strap  - 1 x daily - 7 x weekly - 3 sets - 10 reps - Sidelying Hip Abduction  - 1 x daily - 7 x weekly - 3 sets - 10 reps - Seated Piriformis Stretch with Trunk Bend  - 1 x daily - 7 x weekly - 3 sets - 10 reps   ASSESSMENT:   CLINICAL IMPRESSION: Alyssa Martin continues to demonstrate improvement in overall mobility and strength.  She no longer needs rest breaks at 10 reps and completes all 20 reps without compensation on 4 way hip.  She is also able to do her 4 way hip with 2 lb ankle weight.  Her symptoms may be consistent with some labral irritation or involvement.  We will monitor these symptoms closely.   She would benefit from LE flexibility and hip strengthening to restore proper hip mechanics and reduce hip pain.       OBJECTIVE IMPAIRMENTS Abnormal gait, decreased balance, decreased mobility, difficulty walking, decreased ROM, decreased strength, increased fascial restrictions, increased muscle spasms, impaired flexibility, and pain.    ACTIVITY LIMITATIONS cleaning, community activity, driving, meal prep, laundry, yard work, and shopping.    PERSONAL FACTORS Age and Fitness are also affecting patient's functional outcome.      REHAB POTENTIAL: Good   CLINICAL DECISION MAKING: Stable/uncomplicated   EVALUATION COMPLEXITY: Low      GOALS: Goals reviewed with patient? Yes   SHORT TERM GOALS: Target date: 02/23/2022   Pain report to be no greater than 4/10  Baseline: Goal status: INITIAL   2.  Patient will be independent with initial HEP  Baseline:  Goal status: INITIAL   3.  Patient to be able to walk 300 feet without need to stop and rest due to pain Baseline:  Goal status: INITIAL     LONG TERM GOALS: Target date: 03/23/2022   Patient to be independent with advanced HEP  Baseline:  Goal status: INITIAL   2.  Patient to report pain no greater than 2/10  Baseline:  Goal status: INITIAL   3.  FOTO to be 76 Baseline:  Goal status: INITIAL   4.  Patient to report 75% improved confidence with walking and balance.   Baseline:  Goal status: INITIAL   5.  Patient to be able to walk or stand for 30 min without need to stop and rest due to pain Baseline:  Goal status: INITIAL         PLAN: PT FREQUENCY: 2x/week   PT DURATION: 8 weeks   PLANNED INTERVENTIONS: Therapeutic exercises, Therapeutic activity, Neuromuscular re-education, Balance training, Gait training, Patient/Family education, Joint mobilization, Stair training, DME instructions, Aquatic Therapy, Dry Needling, Electrical stimulation, Cryotherapy, Moist heat, Taping, Ultrasound, and Manual therapy   PLAN FOR NEXT SESSION: NuStep, balance training, progress hip strengthening and stability training, possibly add core strengthening    Tejas Seawood B. Keifer Habib, PT 02/18/22 8:43 AM

## 2022-02-23 ENCOUNTER — Ambulatory Visit: Payer: PPO

## 2022-02-23 DIAGNOSIS — R2681 Unsteadiness on feet: Secondary | ICD-10-CM

## 2022-02-23 DIAGNOSIS — R2689 Other abnormalities of gait and mobility: Secondary | ICD-10-CM

## 2022-02-23 DIAGNOSIS — M25551 Pain in right hip: Secondary | ICD-10-CM

## 2022-02-23 DIAGNOSIS — R42 Dizziness and giddiness: Secondary | ICD-10-CM

## 2022-02-23 DIAGNOSIS — M6281 Muscle weakness (generalized): Secondary | ICD-10-CM

## 2022-02-23 DIAGNOSIS — R262 Difficulty in walking, not elsewhere classified: Secondary | ICD-10-CM

## 2022-02-23 NOTE — Therapy (Signed)
OUTPATIENT PHYSICAL THERAPY TREATMENT NOTE   Patient Name: Alyssa Martin MRN: [id number] DOB:1946-04-22, 76 y.o., female Today's Date: 02/23/2022  PCP: Ginger Organ., MD REFERRING PROVIDER: Mcarthur Rossetti, MD  END OF SESSION:   PT End of Session - 02/23/22 0803     Visit Number 7    Date for PT Re-Evaluation 03/23/22    Authorization Type [employer]    PT Start Time 0803    PT Stop Time 1308    PT Time Calculation (min) 41 min    Activity Tolerance Patient tolerated treatment well    Behavior During Therapy [facility] for tasks assessed/performed             Past Medical History:  Diagnosis Date   Arthritis    Closed fracture of left proximal humerus    Closed right ankle fracture    Depression with anxiety    Elevated BP    GERD (gastroesophageal reflux disease)    Hyperlipidemia    Hypertension    Impaired fasting glucose    Osteopenia    Osteoporosis    Paroxysmal atrial fibrillation ([place])    Vitamin D deficiency    Wears glasses    Past Surgical History:  Procedure Laterality Date   COLONOSCOPY  05/10/2011   DIAGNOSTIC MAMMOGRAM  10 /09/2010   ORIF HUMERUS FRACTURE Left 08/18/2017   Procedure: OPEN REDUCTION INTERNAL FIXATION (ORIF) LEFT PROXIMAL HUMERUS FRACTURE;  Surgeon: Meredith Pel, MD;  Location: [place];  Service: Orthopedics;  Laterality: Left;   TIBIA FRACTURE SURGERY  04/2006   Left--From falling down the stairs   TIBIA FRACTURE SURGERY     In her 40's   Patient Active Problem List   Diagnosis Date Noted   Trochanteric bursitis, right hip 01/26/2022   Shoulder fracture, left 08/18/2017   Paroxysmal A-fib ([place]) 09/08/2012    REFERRING DIAG: M70.61 (ICD-10-CM) - Trochanteric bursitis, right hip   THERAPY DIAG:  Pain in right hip  Muscle weakness (generalized)  Difficulty in walking, not elsewhere classified  Unsteadiness on feet  Dizziness and giddiness  Other abnormalities of gait and mobility  PERTINENT  HISTORY: see above  PRECAUTIONS: none  SUBJECTIVE: Patient reports she is doing well.  "I think things are feeling better"      PAIN:  Are you having pain? Yes: NPRS scale: 2/10 Pain location:  right lateral hip Pain description: discomfort Aggravating factors: walking Relieving factors: rest, meds   OBJECTIVE: (objective measures completed at initial evaluation unless otherwise dated)   OBJECTIVE:    DIAGNOSTIC FINDINGS: XR HIP UNILAT W OR W/O PELVIS 1V RIGHT (Nov 25, 2021)  An AP pelvis lateral right hip shows no acute findings or evidence of              stress fracture.  The hip joint space is well-maintained.    PATIENT SURVEYS:  FOTO 66 (goal 86)   COGNITION:           Overall cognitive status: Within functional limits for tasks assessed                          SENSATION: WFL   MUSCLE LENGTH: Hamstrings: Right 60 deg; Left 60 deg Thomas test: Right neg ; Left neg    POSTURE:  Trunk shift to right   PALPATION: Palpable tenderness   LE ROM:   WNL   LE MMT:   Generally 4/5 with exception of hip abduction  3+/5 and hip extension 3+/5 bilaterally   LOWER EXTREMITY SPECIAL TESTS:  Hip special tests: Saralyn Pilar (FABER) test: negative and Ober's test: positive        GAIT: Distance walked: 50 Assistive device utilized: None Level of assistance: Complete Independence Comments: antalgic       TODAY'S TREATMENT:  02/23/22 NuStep x 5 min level 5 Seated butterfly stretch 3 x 20 sec Supine iron cross stretch 3 x 20 sec Supine figure 4 stretch right hip IR's 3 x 20 sec Hooklying clam x 20 Sidelying clam with green loop x 20 Hooklying right hip IR to stretch right hip ER's  3 x 20 sec 4 way hip strengthening with 2.5 lb Squats to mat table 2 x 10 with 5 lb kb Seated Piriformis stretch 3 x 20 sec right TODAY'S TREATMENT:  02/18/22 Seated butterfly stretch 3 x 20 sec Seated Piriformis stretch 3 x 20 sec right Supine figure 4 stretch right hip IR's 3 x 20  sec Hooklying right hip IR to stretch right hip ER's  3 x 20 sec Supine iron cross stretch 3 x 20 sec Supine IT band stretch  3 x 20 sec each 4 way hip strengthening with 2 lb Sidelying clam with green loop x 20 Hooklying clam x 20 Squats to mat table 2 x 10 with 5 lb kb Educated patient on various reasons for balance issues as we age including vestibular, posture, weakness, etc.   TODAY'S TREATMENT:  02/16/22 Seated butterfly stretch 3 x 20 sec Supine figure 4 stretch right hip IR's Piriformis stretch 3 x 20 sec right Supine IT band stretch  3 x 20 sec each 4 way hip strengthening with 2 lb Sidelying clam with green loop x 20 Hooklying clam x 20 Step up and hold on balance pad x 20 each LE without UE support Lateral band walks 3 laps of 10 steps with green loop  Cone touches 3 sets of 10 each LE (cone on mat table) foot on floor      PATIENT EDUCATION:  Education details: Initiated HEP Person educated: Patient Education method: Consulting civil engineer, Media planner, Verbal cues, and Handouts Education comprehension: verbalized understanding, returned demonstration, and verbal cues required     HOME EXERCISE PROGRAM: Access Code: [email or web address] URL: [email or web address] Date: 01/29/2022 Prepared by: Candyce Churn  Program Notes All stretches hold 30 seconds.  Strengthening exercises: can start with 10 reps and increase to 2 sets of 10.    Exercises - Supine ITB Stretch with Strap  - 1 x daily - 7 x weekly - 1 sets - 3 reps - Supine Hamstring Stretch with Strap  - 1 x daily - 7 x weekly - 1 sets - 3 reps - Small Range Straight Leg Raise  - 1 x daily - 7 x weekly - 2 sets - 10 reps - Sidelying Hip Abduction  - 1 x daily - 7 x weekly - 2 sets - 10 reps - Prone Hip Extension  - 1 x daily - 7 x weekly - 2 sets - 10 reps - Sidelying Hip Adduction  - 1 x daily - 7 x weekly - 2 sets - 10 reps - Seated Piriformis Stretch with Trunk Bend  - 1 x daily - 7 x weekly - 1 sets - 3  repsAccess Code: [email or web address] URL: [email or web address] Date: 01/26/2022 Prepared by: Candyce Churn   Exercises - Supine ITB Stretch with Strap  - 1 x daily - 7 x weekly - 3 sets - 10 reps - Supine  Hamstring Stretch with Strap  - 1 x daily - 7 x weekly - 3 sets - 10 reps - Sidelying Hip Abduction  - 1 x daily - 7 x weekly - 3 sets - 10 reps - Seated Piriformis Stretch with Trunk Bend  - 1 x daily - 7 x weekly - 3 sets - 10 reps   ASSESSMENT:   CLINICAL IMPRESSION: Aldina is progressing appropriately.  She has very little pain to speak of.  She was able to tolerate increased resistance on all exercises.  She would benefit from LE flexibility and hip strengthening to restore proper hip mechanics and reduce hip pain.       OBJECTIVE IMPAIRMENTS Abnormal gait, decreased balance, decreased mobility, difficulty walking, decreased ROM, decreased strength, increased fascial restrictions, increased muscle spasms, impaired flexibility, and pain.    ACTIVITY LIMITATIONS cleaning, community activity, driving, meal prep, laundry, yard work, and shopping.    PERSONAL FACTORS Age and Fitness are also affecting patient's functional outcome.      REHAB POTENTIAL: Good   CLINICAL DECISION MAKING: Stable/uncomplicated   EVALUATION COMPLEXITY: Low     GOALS: Goals reviewed with patient? Yes   SHORT TERM GOALS: Target date: 02/23/2022   Pain report to be no greater than 4/10  Baseline: Goal status: MET   2.  Patient will be independent with initial HEP  Baseline:  Goal status: MET   3.  Patient to be able to walk 300 feet without need to stop and rest due to pain Baseline:  Goal status: MET     LONG TERM GOALS: Target date: 03/23/2022   Patient to be independent with advanced HEP  Baseline:  Goal status: INITIAL   2.  Patient to report pain no greater than 2/10  Baseline:  Goal status: INITIAL   3.  FOTO to be 76 Baseline:  Goal status: INITIAL   4.  Patient to  report 75% improved confidence with walking and balance.   Baseline:  Goal status: INITIAL   5.  Patient to be able to walk or stand for 30 min without need to stop and rest due to pain Baseline:  Goal status: INITIAL         PLAN: PT FREQUENCY: 2x/week   PT DURATION: 8 weeks   PLANNED INTERVENTIONS: Therapeutic exercises, Therapeutic activity, Neuromuscular re-education, Balance training, Gait training, Patient/Family education, Joint mobilization, Stair training, DME instructions, Aquatic Therapy, Dry Needling, Electrical stimulation, Cryotherapy, Moist heat, Taping, Ultrasound, and Manual therapy   PLAN FOR NEXT SESSION: NuStep, balance training, progress hip strengthening and stability training, possibly add core strengthening    Islay Polanco B. Abbeygail Igoe, PT 02/23/22 8:38 AM

## 2022-02-25 ENCOUNTER — Ambulatory Visit: Payer: PPO

## 2022-02-25 DIAGNOSIS — M25551 Pain in right hip: Secondary | ICD-10-CM

## 2022-02-25 DIAGNOSIS — M6281 Muscle weakness (generalized): Secondary | ICD-10-CM

## 2022-02-25 DIAGNOSIS — R2681 Unsteadiness on feet: Secondary | ICD-10-CM

## 2022-02-25 DIAGNOSIS — R262 Difficulty in walking, not elsewhere classified: Secondary | ICD-10-CM

## 2022-02-25 NOTE — Therapy (Signed)
OUTPATIENT PHYSICAL THERAPY TREATMENT NOTE   Patient Name: Alyssa Martin MRN: [id number] DOB:Sep 25, 1945, 76 y.o., female Today's Date: 02/25/2022  PCP: Ginger Organ., MD REFERRING PROVIDER: Mcarthur Rossetti, MD  END OF SESSION:   PT End of Session - 02/25/22 0902     Visit Number 8    Date for PT Re-Evaluation 03/23/22    Authorization Type [employer]    PT Start Time 0845    PT Stop Time 0930    PT Time Calculation (min) 45 min    Activity Tolerance Patient tolerated treatment well    Behavior During Therapy [facility] for tasks assessed/performed             Past Medical History:  Diagnosis Date   Arthritis    Closed fracture of left proximal humerus    Closed right ankle fracture    Depression with anxiety    Elevated BP    GERD (gastroesophageal reflux disease)    Hyperlipidemia    Hypertension    Impaired fasting glucose    Osteopenia    Osteoporosis    Paroxysmal atrial fibrillation ([place])    Vitamin D deficiency    Wears glasses    Past Surgical History:  Procedure Laterality Date   COLONOSCOPY  05/10/2011   DIAGNOSTIC MAMMOGRAM  10 /09/2010   ORIF HUMERUS FRACTURE Left 08/18/2017   Procedure: OPEN REDUCTION INTERNAL FIXATION (ORIF) LEFT PROXIMAL HUMERUS FRACTURE;  Surgeon: Meredith Pel, MD;  Location: [place];  Service: Orthopedics;  Laterality: Left;   TIBIA FRACTURE SURGERY  04/2006   Left--From falling down the stairs   TIBIA FRACTURE SURGERY     In her 40's   Patient Active Problem List   Diagnosis Date Noted   Trochanteric bursitis, right hip 01/26/2022   Shoulder fracture, left 08/18/2017   Paroxysmal A-fib ([place]) 09/08/2012    REFERRING DIAG: M70.61 (ICD-10-CM) - Trochanteric bursitis, right hip   THERAPY DIAG:  Pain in right hip  Muscle weakness (generalized)  Difficulty in walking, not elsewhere classified  Unsteadiness on feet  PERTINENT HISTORY: see above  PRECAUTIONS: none  SUBJECTIVE: Patient reports  minimal pain in the hip but a little back pain since last visit.       PAIN:  Are you having pain? Yes: NPRS scale: 2/10 Pain location:  right lateral hip Pain description: discomfort Aggravating factors: walking Relieving factors: rest, meds   OBJECTIVE: (objective measures completed at initial evaluation unless otherwise dated)   OBJECTIVE:    DIAGNOSTIC FINDINGS: XR HIP UNILAT W OR W/O PELVIS 1V RIGHT (Nov 25, 2021)  An AP pelvis lateral right hip shows no acute findings or evidence of              stress fracture.  The hip joint space is well-maintained.    PATIENT SURVEYS:  FOTO 66 (goal 27)   COGNITION:           Overall cognitive status: Within functional limits for tasks assessed                          SENSATION: WFL   MUSCLE LENGTH: Hamstrings: Right 60 deg; Left 60 deg Thomas test: Right neg ; Left neg    POSTURE:  Trunk shift to right   PALPATION: Palpable tenderness   LE ROM:   WNL   LE MMT:   Generally 4/5 with exception of hip abduction 3+/5 and hip extension 3+/5 bilaterally  LOWER EXTREMITY SPECIAL TESTS:  Hip special tests: Saralyn Pilar (FABER) test: negative and Ober's test: positive        GAIT: Distance walked: 50 Assistive device utilized: None Level of assistance: Complete Independence Comments: antalgic       TODAY'S TREATMENT:  02/25/22 NuStep x 5 min level 5 Seated butterfly stretch 3 x 20 sec Supine iron cross stretch 3 x 20 sec Supine figure 4 stretch right hip IR's 2 x 20 sec Hooklying clam x 20 Blue loop Sidelying clam with blue loop x 20 Hooklying right hip IR to stretch right hip ER's  2 x 20 sec Squats to mat table 2 x 10 with 5 lb kb Standing side lunge with 5 lb kb Seated Piriformis stretch 3 x 20 sec right Lateral band walks blue loop 3 x 10 step laps.  Monster walks fwd and reverse x 3 laps of 10 steps each TODAY'S TREATMENT:  02/23/22 NuStep x 5 min level 5 Seated butterfly stretch 3 x 20 sec Supine iron cross  stretch 3 x 20 sec Supine figure 4 stretch right hip IR's 3 x 20 sec Hooklying clam x 20 Sidelying clam with green loop x 20 Hooklying right hip IR to stretch right hip ER's  3 x 20 sec 4 way hip strengthening with 2.5 lb Squats to mat table 2 x 10 with 5 lb kb Seated Piriformis stretch 3 x 20 sec right TODAY'S TREATMENT:  02/18/22 Seated butterfly stretch 3 x 20 sec Seated Piriformis stretch 3 x 20 sec right Supine figure 4 stretch right hip IR's 3 x 20 sec Hooklying right hip IR to stretch right hip ER's  3 x 20 sec Supine iron cross stretch 3 x 20 sec Supine IT band stretch  3 x 20 sec each 4 way hip strengthening with 2 lb Sidelying clam with green loop x 20 Hooklying clam x 20 Squats to mat table 2 x 10 with 5 lb kb Educated patient on various reasons for balance issues as we age including vestibular, posture, weakness, etc.     PATIENT EDUCATION:  Education details: Initiated HEP Person educated: Patient Education method: Consulting civil engineer, Media planner, Verbal cues, and Handouts Education comprehension: verbalized understanding, returned demonstration, and verbal cues required     HOME EXERCISE PROGRAM: Access Code: [email or web address] URL: [email or web address] Date: 01/29/2022 Prepared by: Candyce Churn  Program Notes All stretches hold 30 seconds.  Strengthening exercises: can start with 10 reps and increase to 2 sets of 10.    Exercises - Supine ITB Stretch with Strap  - 1 x daily - 7 x weekly - 1 sets - 3 reps - Supine Hamstring Stretch with Strap  - 1 x daily - 7 x weekly - 1 sets - 3 reps - Small Range Straight Leg Raise  - 1 x daily - 7 x weekly - 2 sets - 10 reps - Sidelying Hip Abduction  - 1 x daily - 7 x weekly - 2 sets - 10 reps - Prone Hip Extension  - 1 x daily - 7 x weekly - 2 sets - 10 reps - Sidelying Hip Adduction  - 1 x daily - 7 x weekly - 2 sets - 10 reps - Seated Piriformis Stretch with Trunk Bend  - 1 x daily - 7 x weekly - 1 sets - 3  repsAccess Code: [email or web address] URL: [email or web address] Date: 01/26/2022 Prepared by: Candyce Churn   Exercises - Supine ITB Stretch with Strap  - 1 x daily - 7 x weekly -  3 sets - 10 reps - Supine Hamstring Stretch with Strap  - 1 x daily - 7 x weekly - 3 sets - 10 reps - Sidelying Hip Abduction  - 1 x daily - 7 x weekly - 3 sets - 10 reps - Seated Piriformis Stretch with Trunk Bend  - 1 x daily - 7 x weekly - 3 sets - 10 reps   ASSESSMENT:   CLINICAL IMPRESSION: Joylynn continues to tolerate increased resistance with exercises.  We added side lunge with 5 lb today without any increase in pain and with good stability.  She reports feeling more balanced and more secure with daily activities.  She would benefit from LE flexibility and hip strengthening to restore proper hip mechanics and reduce hip pain.       OBJECTIVE IMPAIRMENTS Abnormal gait, decreased balance, decreased mobility, difficulty walking, decreased ROM, decreased strength, increased fascial restrictions, increased muscle spasms, impaired flexibility, and pain.    ACTIVITY LIMITATIONS cleaning, community activity, driving, meal prep, laundry, yard work, and shopping.    PERSONAL FACTORS Age and Fitness are also affecting patient's functional outcome.      REHAB POTENTIAL: Good   CLINICAL DECISION MAKING: Stable/uncomplicated   EVALUATION COMPLEXITY: Low     GOALS: Goals reviewed with patient? Yes   SHORT TERM GOALS: Target date: 02/23/2022   Pain report to be no greater than 4/10  Baseline: Goal status: MET   2.  Patient will be independent with initial HEP  Baseline:  Goal status: MET   3.  Patient to be able to walk 300 feet without need to stop and rest due to pain Baseline:  Goal status: MET     LONG TERM GOALS: Target date: 03/23/2022   Patient to be independent with advanced HEP  Baseline:  Goal status: INITIAL   2.  Patient to report pain no greater than 2/10  Baseline:  Goal  status: INITIAL   3.  FOTO to be 76 Baseline:  Goal status: INITIAL   4.  Patient to report 75% improved confidence with walking and balance.   Baseline:  Goal status: INITIAL   5.  Patient to be able to walk or stand for 30 min without need to stop and rest due to pain Baseline:  Goal status: INITIAL         PLAN: PT FREQUENCY: 2x/week   PT DURATION: 8 weeks   PLANNED INTERVENTIONS: Therapeutic exercises, Therapeutic activity, Neuromuscular re-education, Balance training, Gait training, Patient/Family education, Joint mobilization, Stair training, DME instructions, Aquatic Therapy, Dry Needling, Electrical stimulation, Cryotherapy, Moist heat, Taping, Ultrasound, and Manual therapy   PLAN FOR NEXT SESSION: NuStep, balance training, progress hip strengthening and stability training, possibly add core strengthening    Khamron Gellert B. Lynasia Meloche, PT 02/25/22 9:32 AM

## 2022-03-02 ENCOUNTER — Ambulatory Visit: Payer: PPO

## 2022-03-02 DIAGNOSIS — R2689 Other abnormalities of gait and mobility: Secondary | ICD-10-CM

## 2022-03-02 DIAGNOSIS — M25551 Pain in right hip: Secondary | ICD-10-CM | POA: Diagnosis not present

## 2022-03-02 DIAGNOSIS — R262 Difficulty in walking, not elsewhere classified: Secondary | ICD-10-CM

## 2022-03-02 DIAGNOSIS — R42 Dizziness and giddiness: Secondary | ICD-10-CM

## 2022-03-02 DIAGNOSIS — R2681 Unsteadiness on feet: Secondary | ICD-10-CM

## 2022-03-02 DIAGNOSIS — M6281 Muscle weakness (generalized): Secondary | ICD-10-CM

## 2022-03-02 NOTE — Therapy (Signed)
OUTPATIENT PHYSICAL THERAPY TREATMENT NOTE   Patient Name: Alyssa Martin MRN: [id number] DOB:04/17/1946, 76 y.o., female Today's Date: 03/02/2022  PCP: Ginger Organ., MD REFERRING PROVIDER: Mcarthur Rossetti, MD  END OF SESSION:   PT End of Session - 03/02/22 0849     Visit Number 9    Date for PT Re-Evaluation 03/23/22    Authorization Type [employer]    Progress Note Due on Visit 10    PT Start Time 0845    PT Stop Time 0932    PT Time Calculation (min) 47 min    Activity Tolerance Patient tolerated treatment well    Behavior During Therapy [facility] for tasks assessed/performed             Past Medical History:  Diagnosis Date   Arthritis    Closed fracture of left proximal humerus    Closed right ankle fracture    Depression with anxiety    Elevated BP    GERD (gastroesophageal reflux disease)    Hyperlipidemia    Hypertension    Impaired fasting glucose    Osteopenia    Osteoporosis    Paroxysmal atrial fibrillation ([place])    Vitamin D deficiency    Wears glasses    Past Surgical History:  Procedure Laterality Date   COLONOSCOPY  05/10/2011   DIAGNOSTIC MAMMOGRAM  10 /09/2010   ORIF HUMERUS FRACTURE Left 08/18/2017   Procedure: OPEN REDUCTION INTERNAL FIXATION (ORIF) LEFT PROXIMAL HUMERUS FRACTURE;  Surgeon: Meredith Pel, MD;  Location: [place];  Service: Orthopedics;  Laterality: Left;   TIBIA FRACTURE SURGERY  04/2006   Left--From falling down the stairs   TIBIA FRACTURE SURGERY     In her 40's   Patient Active Problem List   Diagnosis Date Noted   Trochanteric bursitis, right hip 01/26/2022   Shoulder fracture, left 08/18/2017   Paroxysmal A-fib ([place]) 09/08/2012    REFERRING DIAG: M70.61 (ICD-10-CM) - Trochanteric bursitis, right hip   THERAPY DIAG:  Pain in right hip  Muscle weakness (generalized)  Difficulty in walking, not elsewhere classified  Unsteadiness on feet  Dizziness and giddiness  Other  abnormalities of gait and mobility  PERTINENT HISTORY: see above  PRECAUTIONS: none  SUBJECTIVE: Patient reports minimal pain in the hip but still feeling a little unsteady at times due to her knees.       PAIN:  Are you having pain? Yes: NPRS scale: 2/10 Pain location:  right lateral hip Pain description: discomfort Aggravating factors: walking Relieving factors: rest, meds   OBJECTIVE: (objective measures completed at initial evaluation unless otherwise dated)   OBJECTIVE:    DIAGNOSTIC FINDINGS: XR HIP UNILAT W OR W/O PELVIS 1V RIGHT (Nov 25, 2021)  An AP pelvis lateral right hip shows no acute findings or evidence of              stress fracture.  The hip joint space is well-maintained.    PATIENT SURVEYS:  FOTO 66 (goal 45)   COGNITION:           Overall cognitive status: Within functional limits for tasks assessed                          SENSATION: WFL   MUSCLE LENGTH: Hamstrings: Right 60 deg; Left 60 deg Thomas test: Right neg ; Left neg    POSTURE:  Trunk shift to right   PALPATION: Palpable tenderness   LE ROM:  WNL   LE MMT:   Generally 4/5 with exception of hip abduction 3+/5 and hip extension 3+/5 bilaterally   LOWER EXTREMITY SPECIAL TESTS:  Hip special tests: Saralyn Pilar (FABER) test: negative and Ober's test: positive        GAIT: Distance walked: 50 Assistive device utilized: None Level of assistance: Complete Independence Comments: antalgic       TODAY'S TREATMENT:  03/02/22 NuStep x 5 min level 5 Squat to table with 10 lb kb 2 x 10 Sit to stand on balance pad with overhead press with 3 lb dumbells 2 x 10 Marching on balance pad x 20 Seated LAQ x 20 each LE with 6 lb ankle weights Seated march x 20 with 6 lb ankle weights Lateral band walks blue loop 3 x 10 step laps. Seated clam x 20 with blue loop  Monster walks fwd and reverse x 3 laps of 10 steps each Lunge to BOSU fwd and lateral x 10 each LE in each direction Multi hip  40# hip abduction and extension x 20 each   TODAY'S TREATMENT:  02/25/22 NuStep x 5 min level 5 Seated butterfly stretch 3 x 20 sec Supine iron cross stretch 3 x 20 sec Supine figure 4 stretch right hip IR's 2 x 20 sec Hooklying clam x 20 Blue loop Sidelying clam with blue loop x 20 Hooklying right hip IR to stretch right hip ER's  2 x 20 sec Squats to mat table 2 x 10 with 5 lb kb Standing side lunge with 5 lb kb Seated Piriformis stretch 3 x 20 sec right Lateral band walks blue loop 3 x 10 step laps.  Monster walks fwd and reverse x 3 laps of 10 steps each TODAY'S TREATMENT:  02/23/22 NuStep x 5 min level 5 Seated butterfly stretch 3 x 20 sec Supine iron cross stretch 3 x 20 sec Supine figure 4 stretch right hip IR's 3 x 20 sec Hooklying clam x 20 Sidelying clam with green loop x 20 Hooklying right hip IR to stretch right hip ER's  3 x 20 sec 4 way hip strengthening with 2.5 lb Squats to mat table 2 x 10 with 5 lb kb Seated Piriformis stretch 3 x 20 sec right      PATIENT EDUCATION:  Education details: Initiated HEP Person educated: Patient Education method: Consulting civil engineer, Media planner, Verbal cues, and Handouts Education comprehension: verbalized understanding, returned demonstration, and verbal cues required     HOME EXERCISE PROGRAM: Access Code: [email or web address] URL: [email or web address] Date: 01/29/2022 Prepared by: Candyce Churn  Program Notes All stretches hold 30 seconds.  Strengthening exercises: can start with 10 reps and increase to 2 sets of 10.    Exercises - Supine ITB Stretch with Strap  - 1 x daily - 7 x weekly - 1 sets - 3 reps - Supine Hamstring Stretch with Strap  - 1 x daily - 7 x weekly - 1 sets - 3 reps - Small Range Straight Leg Raise  - 1 x daily - 7 x weekly - 2 sets - 10 reps - Sidelying Hip Abduction  - 1 x daily - 7 x weekly - 2 sets - 10 reps - Prone Hip Extension  - 1 x daily - 7 x weekly - 2 sets - 10 reps - Sidelying Hip  Adduction  - 1 x daily - 7 x weekly - 2 sets - 10 reps - Seated Piriformis Stretch with Trunk Bend  - 1 x daily - 7 x weekly - 1  sets - 3 repsAccess Code: [email or web address] URL: [email or web address] Date: 01/26/2022 Prepared by: Candyce Churn   Exercises - Supine ITB Stretch with Strap  - 1 x daily - 7 x weekly - 3 sets - 10 reps - Supine Hamstring Stretch with Strap  - 1 x daily - 7 x weekly - 3 sets - 10 reps - Sidelying Hip Abduction  - 1 x daily - 7 x weekly - 3 sets - 10 reps - Seated Piriformis Stretch with Trunk Bend  - 1 x daily - 7 x weekly - 3 sets - 10 reps   ASSESSMENT:   CLINICAL IMPRESSION: Emberlyn is progressing appropriately.  She demonstrated improved strength in quads as she is able to tolerate increased resistance on all quad activity with decreased fatigue.   We added several balance and dynamic functional strengthening exercises today which she completed without increased pain.  She would benefit from LE flexibility and hip strengthening along with balance training to restore proper hip mechanics and reduce hip pain and to reduce fall risk.       OBJECTIVE IMPAIRMENTS Abnormal gait, decreased balance, decreased mobility, difficulty walking, decreased ROM, decreased strength, increased fascial restrictions, increased muscle spasms, impaired flexibility, and pain.    ACTIVITY LIMITATIONS cleaning, community activity, driving, meal prep, laundry, yard work, and shopping.    PERSONAL FACTORS Age and Fitness are also affecting patient's functional outcome.      REHAB POTENTIAL: Good   CLINICAL DECISION MAKING: Stable/uncomplicated   EVALUATION COMPLEXITY: Low     GOALS: Goals reviewed with patient? Yes   SHORT TERM GOALS: Target date: 02/23/2022   Pain report to be no greater than 4/10  Baseline: Goal status: MET   2.  Patient will be independent with initial HEP  Baseline:  Goal status: MET   3.  Patient to be able to walk 300 feet without need to  stop and rest due to pain Baseline:  Goal status: MET     LONG TERM GOALS: Target date: 03/23/2022   Patient to be independent with advanced HEP  Baseline:  Goal status: INITIAL   2.  Patient to report pain no greater than 2/10  Baseline:  Goal status: INITIAL   3.  FOTO to be 76 Baseline:  Goal status: INITIAL   4.  Patient to report 75% improved confidence with walking and balance.   Baseline:  Goal status: INITIAL   5.  Patient to be able to walk or stand for 30 min without need to stop and rest due to pain Baseline:  Goal status: INITIAL         PLAN: PT FREQUENCY: 2x/week   PT DURATION: 8 weeks   PLANNED INTERVENTIONS: Therapeutic exercises, Therapeutic activity, Neuromuscular re-education, Balance training, Gait training, Patient/Family education, Joint mobilization, Stair training, DME instructions, Aquatic Therapy, Dry Needling, Electrical stimulation, Cryotherapy, Moist heat, Taping, Ultrasound, and Manual therapy   PLAN FOR NEXT SESSION: NuStep, balance training, progress hip strengthening and stability training, possibly add core strengthening    Leilanie Rauda B. Terrel Manalo, PT 03/02/22 9:53 AM

## 2022-03-04 ENCOUNTER — Ambulatory Visit: Payer: PPO

## 2022-03-04 DIAGNOSIS — M25551 Pain in right hip: Secondary | ICD-10-CM | POA: Diagnosis not present

## 2022-03-04 DIAGNOSIS — M6281 Muscle weakness (generalized): Secondary | ICD-10-CM

## 2022-03-04 DIAGNOSIS — R262 Difficulty in walking, not elsewhere classified: Secondary | ICD-10-CM

## 2022-03-04 DIAGNOSIS — R2681 Unsteadiness on feet: Secondary | ICD-10-CM

## 2022-03-04 NOTE — Therapy (Signed)
OUTPATIENT PHYSICAL THERAPY PROGRESS NOTE   Patient Name: Alyssa Martin MRN: [id number] DOB:[birth year], 76 y.o., female Today's Date: 03/04/2022  PCP: Ginger Organ., MD REFERRING PROVIDER: Mcarthur Rossetti, MD  END OF SESSION:   PT End of Session - 03/04/22 0849     Visit Number 10    Date for PT Re-Evaluation 03/23/22    Authorization Type [employer]    Progress Note Due on Visit 10    PT Start Time 0849    PT Stop Time 0930    PT Time Calculation (min) 41 min    Activity Tolerance Patient tolerated treatment well    Behavior During Therapy [facility] for tasks assessed/performed             Past Medical History:  Diagnosis Date   Arthritis    Closed fracture of left proximal humerus    Closed right ankle fracture    Depression with anxiety    Elevated BP    GERD (gastroesophageal reflux disease)    Hyperlipidemia    Hypertension    Impaired fasting glucose    Osteopenia    Osteoporosis    Paroxysmal atrial fibrillation ([place])    Vitamin D deficiency    Wears glasses    Past Surgical History:  Procedure Laterality Date   COLONOSCOPY  05/10/2011   DIAGNOSTIC MAMMOGRAM  10 /09/2010   ORIF HUMERUS FRACTURE Left 08/18/2017   Procedure: OPEN REDUCTION INTERNAL FIXATION (ORIF) LEFT PROXIMAL HUMERUS FRACTURE;  Surgeon: Meredith Pel, MD;  Location: [place];  Service: Orthopedics;  Laterality: Left;   TIBIA FRACTURE SURGERY  04/2006   Left--From falling down the stairs   TIBIA FRACTURE SURGERY     In her 40's   Patient Active Problem List   Diagnosis Date Noted   Trochanteric bursitis, right hip 01/26/2022   Shoulder fracture, left 08/18/2017   Paroxysmal A-fib ([place]) 09/08/2012    REFERRING DIAG: M70.61 (ICD-10-CM) - Trochanteric bursitis, right hip   THERAPY DIAG:  Pain in right hip  Muscle weakness (generalized)  Difficulty in walking, not elsewhere classified  Unsteadiness on feet  PERTINENT HISTORY: see above  PRECAUTIONS:  none  SUBJECTIVE: Patient reports minimal pain after last session but none today.       PAIN:  Are you having pain? Yes: NPRS scale: 0/10 Pain location:  right lateral hip Pain description: discomfort Aggravating factors: walking Relieving factors: rest, meds   Progress Note Reporting Period 01/26/22 to 03/04/22  See note below for Objective Data and Assessment of Progress/Goals.     OBJECTIVE: (objective measures completed at initial evaluation unless otherwise dated)   OBJECTIVE:    DIAGNOSTIC FINDINGS: XR HIP UNILAT W OR W/O PELVIS 1V RIGHT (Nov 25, 2021)  An AP pelvis lateral right hip shows no acute findings or evidence of              stress fracture.  The hip joint space is well-maintained.    PATIENT SURVEYS:  FOTO 66 (goal 10), (03/04/22 improved to 75)   COGNITION:           Overall cognitive status: Within functional limits for tasks assessed                          SENSATION: [facility]   MUSCLE LENGTH: Hamstrings: Right 60 deg; Left 60 deg (03/04/22 improved to approx 65 bilaterally) Thomas test: Right neg ; Left neg    POSTURE:  Trunk  shift to right   PALPATION: Palpable tenderness originally at right hip no longer tender to palpation on 03/04/22   LE ROM:   WNL   LE MMT:   Generally 4/5 with exception of hip abduction 3+/5 and hip extension 3+/5 bilaterally (03/04/22 Generally improved to 4+/5,   4-/5 bilateral hip ext and hip ext)   LOWER EXTREMITY SPECIAL TESTS:  Hip special tests: Saralyn Pilar (FABER) test: negative and Ober's test: positive (03/04/22 still slightly positive)       GAIT: Distance walked: 50 Assistive device utilized: None Level of assistance: Complete Independence Comments: antalgic       TODAY'S TREATMENT:  03/04/22 NuStep x 5 min level 5 Re-assessment: see above Squat to table with 10 lb kb 2 x 10 Sit to stand on balance pad with overhead press with 3 lb dumbells 2 x 10 Marching on balance pad x 20  Seated LAQ x 20 each LE with  6 lb ankle weights Seated march x 20 with 6 lb ankle weights   TODAY'S TREATMENT:  03/02/22 NuStep x 5 min level 5 Squat to table with 10 lb kb 2 x 10 Sit to stand on balance pad with overhead press with 3 lb dumbells 2 x 10 Marching on balance pad x 20 Seated LAQ x 20 each LE with 6 lb ankle weights Seated march x 20 with 6 lb ankle weights Lateral band walks blue loop 3 x 10 step laps. Seated clam x 20 with blue loop  Monster walks fwd and reverse x 3 laps of 10 steps each Lunge to BOSU fwd and lateral x 10 each LE in each direction Multi hip 40# hip abduction and extension x 20 each   TODAY'S TREATMENT:  02/25/22 NuStep x 5 min level 5 Seated butterfly stretch 3 x 20 sec Supine iron cross stretch 3 x 20 sec Supine figure 4 stretch right hip IR's 2 x 20 sec Hooklying clam x 20 Blue loop Sidelying clam with blue loop x 20 Hooklying right hip IR to stretch right hip ER's  2 x 20 sec Squats to mat table 2 x 10 with 5 lb kb Standing side lunge with 5 lb kb Seated Piriformis stretch 3 x 20 sec right Lateral band walks blue loop 3 x 10 step laps.  Monster walks fwd and reverse x 3 laps of 10 steps each      PATIENT EDUCATION:  Education details: Updated HEP Person educated: Patient Education method: Explanation, Demonstration, Verbal cues, and Handouts Education comprehension: verbalized understanding, returned demonstration, and verbal cues required     HOME EXERCISE PROGRAM: Access Code: [email or web address] URL: [email or web address] Date: 03/04/2022 Prepared by: Candyce Churn  Program Notes All stretches hold 30 seconds.  Strengthening exercises: can start with 10 reps and increase to 2 sets of 10.    Exercises - Supine ITB Stretch with Strap  - 1 x daily - 7 x weekly - 1 sets - 3 reps - Supine Hamstring Stretch with Strap  - 1 x daily - 7 x weekly - 1 sets - 3 reps - Small Range Straight Leg Raise  - 1 x daily - 7 x weekly - 2 sets - 10 reps - Sidelying Hip  Abduction  - 1 x daily - 7 x weekly - 2 sets - 10 reps - Prone Hip Extension  - 1 x daily - 7 x weekly - 2 sets - 10 reps - Sidelying Hip Adduction  - 1 x daily - 7 x weekly -  2 sets - 10 reps - Clamshell with Resistance  - 1 x daily - 7 x weekly - 2 sets - 10 reps - Seated Hip Abduction  - 1 x daily - 7 x weekly - 2 sets - 10 reps - Side Stepping with Resistance at Ankles  - 1 x daily - 7 x weekly - 3 sets - 10 reps - Seated Piriformis Stretch with Trunk Bend  - 1 x daily - 7 x weekly - 1 sets - 3 reps - Seated Long Arc Quad  - 1 x daily - 7 x weekly - 3 sets - 10 reps - Seated Hip Flexion March with Ankle Weights  - 1 x daily - 7 x weekly - 2 sets - 10 reps - Squat with Chair Touch  - 1 x daily - 7 x weekly - 2 sets - 10 reps - Butterfly Groin Stretch  - 1 x daily - 7 x weekly - 1 sets - 10 reps - 10 sec hold   ASSESSMENT:   CLINICAL IMPRESSION: Gilberto is progressing appropriately.  All objective findings have improved including functional score.  She is well motivated and compliant.  She should continue to improve.   She would benefit from continued skilled PT for  LE flexibility and hip strengthening along with balance training to restore proper hip mechanics and reduce hip pain and to reduce fall risk.       OBJECTIVE IMPAIRMENTS Abnormal gait, decreased balance, decreased mobility, difficulty walking, decreased ROM, decreased strength, increased fascial restrictions, increased muscle spasms, impaired flexibility, and pain.    ACTIVITY LIMITATIONS cleaning, community activity, driving, meal prep, laundry, yard work, and shopping.    PERSONAL FACTORS Age and Fitness are also affecting patient's functional outcome.      REHAB POTENTIAL: Good   CLINICAL DECISION MAKING: Stable/uncomplicated   EVALUATION COMPLEXITY: Low     GOALS: Goals reviewed with patient? Yes   SHORT TERM GOALS: Target date: 02/23/2022   Pain report to be no greater than 4/10  Baseline: Goal status: MET    2.  Patient will be independent with initial HEP  Baseline:  Goal status: MET   3.  Patient to be able to walk 300 feet without need to stop and rest due to pain Baseline:  Goal status: MET     LONG TERM GOALS: Target date: 03/23/2022   Patient to be independent with advanced HEP  Baseline:  Goal status: INITIAL   2.  Patient to report pain no greater than 2/10  Baseline:  Goal status: INITIAL   3.  FOTO to be 76 Baseline:  Goal status: INITIAL   4.  Patient to report 75% improved confidence with walking and balance.   Baseline:  Goal status: INITIAL   5.  Patient to be able to walk or stand for 30 min without need to stop and rest due to pain Baseline:  Goal status: INITIAL         PLAN: PT FREQUENCY: 2x/week   PT DURATION: 8 weeks   PLANNED INTERVENTIONS: Therapeutic exercises, Therapeutic activity, Neuromuscular re-education, Balance training, Gait training, Patient/Family education, Joint mobilization, Stair training, DME instructions, Aquatic Therapy, Dry Needling, Electrical stimulation, Cryotherapy, Moist heat, Taping, Ultrasound, and Manual therapy   PLAN FOR NEXT SESSION: NuStep, balance training, progress hip strengthening and stability training, possibly add core strengthening    Rosabel Sermeno B. Erinn Huskins, PT 03/04/22 9:11 PM [facility] [address] [place], [place] [address] Phone # [phone]  Fax [phone]

## 2022-03-08 ENCOUNTER — Ambulatory Visit: Payer: PPO | Admitting: Orthopaedic Surgery

## 2022-03-08 ENCOUNTER — Encounter: Payer: Self-pay | Admitting: Orthopaedic Surgery

## 2022-03-08 DIAGNOSIS — M7061 Trochanteric bursitis, right hip: Secondary | ICD-10-CM

## 2022-03-08 NOTE — Progress Notes (Signed)
The patient comes in today for continued follow-up as a relates to her balance and coordination issues which are chronic as well as right hip bursitis.  The physical therapy has helped her greatly and Alyssa Martin has done a great job working with the patient and the patient wanted make sure that we knew that her therapist has done wonderful for working on her balance and coordination as well as helping decrease her left foot pain.  She is designed a program for her from an outpatient standpoint that she can take 2 a Physiological scientist at [employer].  Alyssa Martin is 76 years old.  She gets up from the chair much easier today just looking at her gait and looking at her overall she looks great.  Her right hip moves smoothly and fluidly and there is some pain to palpation over the trochanteric area.  Of note she cannot take anti-inflammatories due to being on chronic blood thinning medication.  At this point she is doing well enough the follow-up can be as needed.  If things worsen anyway she knows to let us know.  All questions and concerns were answered and addressed.

## 2022-03-09 ENCOUNTER — Ambulatory Visit: Payer: PPO

## 2022-03-09 DIAGNOSIS — R2681 Unsteadiness on feet: Secondary | ICD-10-CM

## 2022-03-09 DIAGNOSIS — M25551 Pain in right hip: Secondary | ICD-10-CM | POA: Diagnosis not present

## 2022-03-09 DIAGNOSIS — M6281 Muscle weakness (generalized): Secondary | ICD-10-CM

## 2022-03-09 DIAGNOSIS — R2689 Other abnormalities of gait and mobility: Secondary | ICD-10-CM

## 2022-03-09 DIAGNOSIS — R42 Dizziness and giddiness: Secondary | ICD-10-CM

## 2022-03-09 DIAGNOSIS — R262 Difficulty in walking, not elsewhere classified: Secondary | ICD-10-CM

## 2022-03-09 NOTE — Therapy (Signed)
OUTPATIENT PHYSICAL THERAPY TREATMENT NOTE   Patient Name: Alyssa Martin MRN: [id number] DOB:October 20, 1945, 76 y.o., female Today's Date: 03/09/2022  PCP: Ginger Organ., MD REFERRING PROVIDER: Mcarthur Rossetti, MD  END OF SESSION:   PT End of Session - 03/09/22 1636     Visit Number 11    Date for PT Re-Evaluation 03/23/22    Authorization Type [employer]    PT Start Time 1615    PT Stop Time 1700    PT Time Calculation (min) 45 min    Activity Tolerance Patient tolerated treatment well    Behavior During Therapy [facility] for tasks assessed/performed              Past Medical History:  Diagnosis Date   Arthritis    Closed fracture of left proximal humerus    Closed right ankle fracture    Depression with anxiety    Elevated BP    GERD (gastroesophageal reflux disease)    Hyperlipidemia    Hypertension    Impaired fasting glucose    Osteopenia    Osteoporosis    Paroxysmal atrial fibrillation ([place])    Vitamin D deficiency    Wears glasses    Past Surgical History:  Procedure Laterality Date   COLONOSCOPY  05/10/2011   DIAGNOSTIC MAMMOGRAM  10 /09/2010   ORIF HUMERUS FRACTURE Left 08/18/2017   Procedure: OPEN REDUCTION INTERNAL FIXATION (ORIF) LEFT PROXIMAL HUMERUS FRACTURE;  Surgeon: Meredith Pel, MD;  Location: [place];  Service: Orthopedics;  Laterality: Left;   TIBIA FRACTURE SURGERY  04/2006   Left--From falling down the stairs   TIBIA FRACTURE SURGERY     In her 40's   Patient Active Problem List   Diagnosis Date Noted   Trochanteric bursitis, right hip 01/26/2022   Shoulder fracture, left 08/18/2017   Paroxysmal A-fib ([place]) 09/08/2012    REFERRING DIAG: M70.61 (ICD-10-CM) - Trochanteric bursitis, right hip   THERAPY DIAG:  Pain in right hip  Muscle weakness (generalized)  Difficulty in walking, not elsewhere classified  Unsteadiness on feet  Dizziness and giddiness  Other abnormalities of gait and  mobility  PERTINENT HISTORY: see above  PRECAUTIONS: none  SUBJECTIVE: Patient reports minimal pain.  Saw Dr. Ninfa Linden for f/u.  He suggested she finish up her current plan of treatment with PT then begin regular exercise regimen at a local fitness facility.       PAIN:  Are you having pain? Yes: NPRS scale: 0/10 Pain location:  right lateral hip Pain description: discomfort Aggravating factors: walking Relieving factors: rest, meds     OBJECTIVE: (objective measures completed at initial evaluation unless otherwise dated)   OBJECTIVE:    DIAGNOSTIC FINDINGS: XR HIP UNILAT W OR W/O PELVIS 1V RIGHT (Nov 25, 2021)  An AP pelvis lateral right hip shows no acute findings or evidence of              stress fracture.  The hip joint space is well-maintained.    PATIENT SURVEYS:  FOTO 66 (goal 20), (03/04/22 improved to 75)   COGNITION:           Overall cognitive status: Within functional limits for tasks assessed                          SENSATION: [facility]   MUSCLE LENGTH: Hamstrings: Right 60 deg; Left 60 deg (03/04/22 improved to approx 65 bilaterally) Thomas test: Right neg ; Left neg  POSTURE:  Trunk shift to right   PALPATION: Palpable tenderness originally at right hip no longer tender to palpation on 03/04/22   LE ROM:   WNL   LE MMT:   Generally 4/5 with exception of hip abduction 3+/5 and hip extension 3+/5 bilaterally (03/04/22 Generally improved to 4+/5,   4-/5 bilateral hip ext and hip ext)   LOWER EXTREMITY SPECIAL TESTS:  Hip special tests: Saralyn Pilar (FABER) test: negative and Ober's test: positive (03/04/22 still slightly positive)       GAIT: Distance walked: 50 Assistive device utilized: None Level of assistance: Complete Independence Comments: antalgic       TODAY'S TREATMENT:  03/09/22 NuStep x 5 min level 5 Lateral band walks and monster walks fwd and backward x 3 laps each with blue band Squat to table with 10 lb  2 x 10 Sit to stand on balance  pad with overhead press with 3 lb dumbells 2 x 10 Marching on balance pad x 20  Step up and high knee on balance pad x 10 each LE fwd then lateral x 10 each LE Seated LAQ x 20 each LE with 6 lb ankle weights Seated march x 20 with 6 lb ankle weights Cone touches 3 x 10   TODAY'S TREATMENT:  03/04/22 NuStep x 5 min level 5 Re-assessment: see above Squat to table with 10 lb kb 2 x 10 Sit to stand on balance pad with overhead press with 3 lb dumbells 2 x 10 Marching on balance pad x 20  Seated LAQ x 20 each LE with 6 lb ankle weights Seated march x 20 with 6 lb ankle weights   TODAY'S TREATMENT:  03/02/22 NuStep x 5 min level 5 Squat to table with 10 lb kb 2 x 10 Sit to stand on balance pad with overhead press with 3 lb dumbells 2 x 10 Marching on balance pad x 20 Seated LAQ x 20 each LE with 6 lb ankle weights Seated march x 20 with 6 lb ankle weights Lateral band walks blue loop 3 x 10 step laps. Seated clam x 20 with blue loop  Monster walks fwd and reverse x 3 laps of 10 steps each Lunge to BOSU fwd and lateral x 10 each LE in each direction Multi hip 40# hip abduction and extension x 20 each     PATIENT EDUCATION:  Education details: Updated HEP Person educated: Patient Education method: Explanation, Demonstration, Verbal cues, and Handouts Education comprehension: verbalized understanding, returned demonstration, and verbal cues required     HOME EXERCISE PROGRAM: Access Code: [email or web address] URL: [email or web address] Date: 03/04/2022 Prepared by: Candyce Churn  Program Notes All stretches hold 30 seconds.  Strengthening exercises: can start with 10 reps and increase to 2 sets of 10.    Exercises - Supine ITB Stretch with Strap  - 1 x daily - 7 x weekly - 1 sets - 3 reps - Supine Hamstring Stretch with Strap  - 1 x daily - 7 x weekly - 1 sets - 3 reps - Small Range Straight Leg Raise  - 1 x daily - 7 x weekly - 2 sets - 10 reps - Sidelying Hip Abduction   - 1 x daily - 7 x weekly - 2 sets - 10 reps - Prone Hip Extension  - 1 x daily - 7 x weekly - 2 sets - 10 reps - Sidelying Hip Adduction  - 1 x daily - 7 x weekly - 2 sets - 10 reps - Clamshell  with Resistance  - 1 x daily - 7 x weekly - 2 sets - 10 reps - Seated Hip Abduction  - 1 x daily - 7 x weekly - 2 sets - 10 reps - Side Stepping with Resistance at Ankles  - 1 x daily - 7 x weekly - 3 sets - 10 reps - Seated Piriformis Stretch with Trunk Bend  - 1 x daily - 7 x weekly - 1 sets - 3 reps - Seated Long Arc Quad  - 1 x daily - 7 x weekly - 3 sets - 10 reps - Seated Hip Flexion March with Ankle Weights  - 1 x daily - 7 x weekly - 2 sets - 10 reps - Squat with Chair Touch  - 1 x daily - 7 x weekly - 2 sets - 10 reps - Butterfly Groin Stretch  - 1 x daily - 7 x weekly - 1 sets - 10 reps - 10 sec hold   ASSESSMENT:   CLINICAL IMPRESSION: Angelys is making excellent progress and should continue to make appropriate gains in order to begin independent fitness routine at her local fitness facility once discharged.  She is compliant and well motivated.  We will continue through 03/17/22.     OBJECTIVE IMPAIRMENTS Abnormal gait, decreased balance, decreased mobility, difficulty walking, decreased ROM, decreased strength, increased fascial restrictions, increased muscle spasms, impaired flexibility, and pain.    ACTIVITY LIMITATIONS cleaning, community activity, driving, meal prep, laundry, yard work, and shopping.    PERSONAL FACTORS Age and Fitness are also affecting patient's functional outcome.      REHAB POTENTIAL: Good   CLINICAL DECISION MAKING: Stable/uncomplicated   EVALUATION COMPLEXITY: Low     GOALS: Goals reviewed with patient? Yes   SHORT TERM GOALS: Target date: 02/23/2022   Pain report to be no greater than 4/10  Baseline: Goal status: MET   2.  Patient will be independent with initial HEP  Baseline:  Goal status: MET   3.  Patient to be able to walk 300 feet without  need to stop and rest due to pain Baseline:  Goal status: MET     LONG TERM GOALS: Target date: 03/23/2022   Patient to be independent with advanced HEP  Baseline:  Goal status: INITIAL   2.  Patient to report pain no greater than 2/10  Baseline:  Goal status: INITIAL   3.  FOTO to be 76 Baseline:  Goal status: INITIAL   4.  Patient to report 75% improved confidence with walking and balance.   Baseline:  Goal status: INITIAL   5.  Patient to be able to walk or stand for 30 min without need to stop and rest due to pain Baseline:  Goal status: INITIAL         PLAN: PT FREQUENCY: 2x/week   PT DURATION: 8 weeks   PLANNED INTERVENTIONS: Therapeutic exercises, Therapeutic activity, Neuromuscular re-education, Balance training, Gait training, Patient/Family education, Joint mobilization, Stair training, DME instructions, Aquatic Therapy, Dry Needling, Electrical stimulation, Cryotherapy, Moist heat, Taping, Ultrasound, and Manual therapy   PLAN FOR NEXT SESSION: NuStep, balance training, progress hip strengthening and stability training, possibly add core strengthening    Kariah Loredo B. Nile Prisk, PT 03/09/22 10:15 PM  [facility] [address], [place], [place] [address] Phone # [phone] Fax [phone]

## 2022-03-11 ENCOUNTER — Ambulatory Visit: Payer: PPO

## 2022-03-11 DIAGNOSIS — M25551 Pain in right hip: Secondary | ICD-10-CM | POA: Diagnosis not present

## 2022-03-11 DIAGNOSIS — R262 Difficulty in walking, not elsewhere classified: Secondary | ICD-10-CM

## 2022-03-11 DIAGNOSIS — R2681 Unsteadiness on feet: Secondary | ICD-10-CM

## 2022-03-11 DIAGNOSIS — M6281 Muscle weakness (generalized): Secondary | ICD-10-CM

## 2022-03-11 NOTE — Therapy (Signed)
OUTPATIENT PHYSICAL THERAPY TREATMENT NOTE   Patient Name: Alyssa Martin MRN: [id number] DOB:[birth year], 76 y.o., female Today's Date: 03/11/2022  PCP: Ginger Organ., MD REFERRING PROVIDER: Mcarthur Rossetti, MD  END OF SESSION:   PT End of Session - 03/11/22 0849     Visit Number 12    Date for PT Re-Evaluation 03/23/22    Authorization Type [employer]    Progress Note Due on Visit 51    PT Start Time 0845    PT Stop Time 0928    PT Time Calculation (min) 43 min    Activity Tolerance Patient tolerated treatment well    Behavior During Therapy [facility] for tasks assessed/performed              Past Medical History:  Diagnosis Date   Arthritis    Closed fracture of left proximal humerus    Closed right ankle fracture    Depression with anxiety    Elevated BP    GERD (gastroesophageal reflux disease)    Hyperlipidemia    Hypertension    Impaired fasting glucose    Osteopenia    Osteoporosis    Paroxysmal atrial fibrillation ([place])    Vitamin D deficiency    Wears glasses    Past Surgical History:  Procedure Laterality Date   COLONOSCOPY  05/10/2011   DIAGNOSTIC MAMMOGRAM  10 /09/2010   ORIF HUMERUS FRACTURE Left 08/18/2017   Procedure: OPEN REDUCTION INTERNAL FIXATION (ORIF) LEFT PROXIMAL HUMERUS FRACTURE;  Surgeon: Meredith Pel, MD;  Location: [place];  Service: Orthopedics;  Laterality: Left;   TIBIA FRACTURE SURGERY  04/2006   Left--From falling down the stairs   TIBIA FRACTURE SURGERY     In her 40's   Patient Active Problem List   Diagnosis Date Noted   Trochanteric bursitis, right hip 01/26/2022   Shoulder fracture, left 08/18/2017   Paroxysmal A-fib ([place]) 09/08/2012    REFERRING DIAG: M70.61 (ICD-10-CM) - Trochanteric bursitis, right hip   THERAPY DIAG:  Pain in right hip  Muscle weakness (generalized)  Difficulty in walking, not elsewhere classified  Unsteadiness on feet  PERTINENT HISTORY: see  above  PRECAUTIONS: none  SUBJECTIVE: Patient reports a little pain in her low back mostly to the right.    PAIN:  Are you having pain? Yes: NPRS scale: 0/10 Pain location:  right lateral hip Pain description: discomfort Aggravating factors: walking Relieving factors: rest, meds     OBJECTIVE: (objective measures completed at initial evaluation unless otherwise dated)   OBJECTIVE:    DIAGNOSTIC FINDINGS: XR HIP UNILAT W OR W/O PELVIS 1V RIGHT (Nov 25, 2021)  An AP pelvis lateral right hip shows no acute findings or evidence of              stress fracture.  The hip joint space is well-maintained.    PATIENT SURVEYS:  FOTO 66 (goal 59), (03/04/22 improved to 75)   COGNITION:           Overall cognitive status: Within functional limits for tasks assessed                          SENSATION: WFL   MUSCLE LENGTH: Hamstrings: Right 60 deg; Left 60 deg (03/04/22 improved to approx 65 bilaterally) Thomas test: Right neg ; Left neg    POSTURE:  Trunk shift to right   PALPATION: Palpable tenderness originally at right hip no longer tender to palpation on 03/04/22  LE ROM:   WNL   LE MMT:   Generally 4/5 with exception of hip abduction 3+/5 and hip extension 3+/5 bilaterally (03/04/22 Generally improved to 4+/5,   4-/5 bilateral hip ext and hip ext)   LOWER EXTREMITY SPECIAL TESTS:  Hip special tests: Saralyn Pilar (FABER) test: negative and Ober's test: positive (03/04/22 still slightly positive)       GAIT: Distance walked: 50 Assistive device utilized: None Level of assistance: Complete Independence Comments: antalgic       TODAY'S TREATMENT:  03/11/22 NuStep x 5 min level 5 Lateral band walks and monster walks fwd and backward x 3 laps each with blue band Squat to table with 10 lb kb  2 x 10 Sit to stand on balance pad with overhead press with 3 lb dumbells 2 x 10 Marching on balance pad x 20  Step up and high knee on balance pad x 10 each LE fwd then lateral x 10  each LE Seated LAQ x 20 each LE with 6 lb ankle weights Seated march x 20 with 6 lb ankle weights Seated hip ER (bottom of foot to opposite ankle) with 6 lb ankle weight  Cone touches 3 x 10 TODAY'S TREATMENT:  03/09/22 NuStep x 5 min level 5 Lateral band walks and monster walks fwd and backward x 3 laps each with blue band Squat to table with 10 lb  2 x 10 Sit to stand on balance pad with overhead press with 3 lb dumbells 2 x 10 Marching on balance pad x 20  Step up and high knee on balance pad x 10 each LE fwd then lateral x 10 each LE Seated LAQ x 20 each LE with 6 lb ankle weights Seated march x 20 with 6 lb ankle weights Cone touches 3 x 10   TODAY'S TREATMENT:  03/04/22 NuStep x 5 min level 5 Re-assessment: see above Squat to table with 10 lb kb 2 x 10 Sit to stand on balance pad with overhead press with 3 lb dumbells 2 x 10 Marching on balance pad x 20  Seated LAQ x 20 each LE with 6 lb ankle weights Seated march x 20 with 6 lb ankle weights    PATIENT EDUCATION:  Education details: Updated HEP Person educated: Patient Education method: Consulting civil engineer, Demonstration, Verbal cues, and Handouts Education comprehension: verbalized understanding, returned demonstration, and verbal cues required     HOME EXERCISE PROGRAM: Access Code: [email or web address] URL: [email or web address] Date: 03/04/2022 Prepared by: Candyce Churn  Program Notes All stretches hold 30 seconds.  Strengthening exercises: can start with 10 reps and increase to 2 sets of 10.    Exercises - Supine ITB Stretch with Strap  - 1 x daily - 7 x weekly - 1 sets - 3 reps - Supine Hamstring Stretch with Strap  - 1 x daily - 7 x weekly - 1 sets - 3 reps - Small Range Straight Leg Raise  - 1 x daily - 7 x weekly - 2 sets - 10 reps - Sidelying Hip Abduction  - 1 x daily - 7 x weekly - 2 sets - 10 reps - Prone Hip Extension  - 1 x daily - 7 x weekly - 2 sets - 10 reps - Sidelying Hip Adduction  - 1 x daily -  7 x weekly - 2 sets - 10 reps - Clamshell with Resistance  - 1 x daily - 7 x weekly - 2 sets - 10 reps - Seated Hip Abduction  -  1 x daily - 7 x weekly - 2 sets - 10 reps - Side Stepping with Resistance at Ankles  - 1 x daily - 7 x weekly - 3 sets - 10 reps - Seated Piriformis Stretch with Trunk Bend  - 1 x daily - 7 x weekly - 1 sets - 3 reps - Seated Long Arc Quad  - 1 x daily - 7 x weekly - 3 sets - 10 reps - Seated Hip Flexion March with Ankle Weights  - 1 x daily - 7 x weekly - 2 sets - 10 reps - Squat with Chair Touch  - 1 x daily - 7 x weekly - 2 sets - 10 reps - Butterfly Groin Stretch  - 1 x daily - 7 x weekly - 1 sets - 10 reps - 10 sec hold   ASSESSMENT:   CLINICAL IMPRESSION: Averie tolerated todays session without any low back pain.  She is gaining strength stability t/o bilateral hips.  She was able to do 10 consecutive cone taps witihout loss of balance today on each leg.     OBJECTIVE IMPAIRMENTS Abnormal gait, decreased balance, decreased mobility, difficulty walking, decreased ROM, decreased strength, increased fascial restrictions, increased muscle spasms, impaired flexibility, and pain.    ACTIVITY LIMITATIONS cleaning, community activity, driving, meal prep, laundry, yard work, and shopping.    PERSONAL FACTORS Age and Fitness are also affecting patient's functional outcome.      REHAB POTENTIAL: Good   CLINICAL DECISION MAKING: Stable/uncomplicated   EVALUATION COMPLEXITY: Low     GOALS: Goals reviewed with patient? Yes   SHORT TERM GOALS: Target date: 02/23/2022   Pain report to be no greater than 4/10  Baseline: Goal status: MET   2.  Patient will be independent with initial HEP  Baseline:  Goal status: MET   3.  Patient to be able to walk 300 feet without need to stop and rest due to pain Baseline:  Goal status: MET     LONG TERM GOALS: Target date: 03/23/2022   Patient to be independent with advanced HEP  Baseline:  Goal status: INITIAL   2.   Patient to report pain no greater than 2/10  Baseline:  Goal status: INITIAL   3.  FOTO to be 76 Baseline:  Goal status: INITIAL   4.  Patient to report 75% improved confidence with walking and balance.   Baseline:  Goal status: INITIAL   5.  Patient to be able to walk or stand for 30 min without need to stop and rest due to pain Baseline:  Goal status: INITIAL         PLAN: PT FREQUENCY: 2x/week   PT DURATION: 8 weeks   PLANNED INTERVENTIONS: Therapeutic exercises, Therapeutic activity, Neuromuscular re-education, Balance training, Gait training, Patient/Family education, Joint mobilization, Stair training, DME instructions, Aquatic Therapy, Dry Needling, Electrical stimulation, Cryotherapy, Moist heat, Taping, Ultrasound, and Manual therapy   PLAN FOR NEXT SESSION: NuStep, balance training, progress hip strengthening and stability training, possibly add core strengthening    Kanon Colunga B. Andreyah Natividad, PT 03/11/22 9:31 AM  [facility] [address], [place], [place] [address] Phone # [phone] Fax [phone]

## 2022-03-16 ENCOUNTER — Ambulatory Visit: Payer: PPO

## 2022-03-16 DIAGNOSIS — R2681 Unsteadiness on feet: Secondary | ICD-10-CM

## 2022-03-16 DIAGNOSIS — R252 Cramp and spasm: Secondary | ICD-10-CM

## 2022-03-16 DIAGNOSIS — M6281 Muscle weakness (generalized): Secondary | ICD-10-CM

## 2022-03-16 DIAGNOSIS — M25551 Pain in right hip: Secondary | ICD-10-CM | POA: Diagnosis not present

## 2022-03-16 DIAGNOSIS — R262 Difficulty in walking, not elsewhere classified: Secondary | ICD-10-CM

## 2022-03-16 NOTE — Therapy (Signed)
OUTPATIENT PHYSICAL THERAPY TREATMENT NOTE   Patient Name: Alyssa Martin MRN: [id number] DOB:04/23/1946, 76 y.o., female Today's Date: 03/16/2022  PCP: Cleatis Polka., MD REFERRING PROVIDER: Kathryne Hitch, MD  END OF SESSION:   PT End of Session - 03/16/22 0852     Visit Number 13    Date for PT Re-Evaluation 03/23/22    Authorization Type [employer]    Progress Note Due on Visit 20    PT Start Time 0848    PT Stop Time 0930    PT Time Calculation (min) 42 min    Activity Tolerance Patient tolerated treatment well    Behavior During Therapy [facility] for tasks assessed/performed              Past Medical History:  Diagnosis Date   Arthritis    Closed fracture of left proximal humerus    Closed right ankle fracture    Depression with anxiety    Elevated BP    GERD (gastroesophageal reflux disease)    Hyperlipidemia    Hypertension    Impaired fasting glucose    Osteopenia    Osteoporosis    Paroxysmal atrial fibrillation ([facility])    Vitamin D deficiency    Wears glasses    Past Surgical History:  Procedure Laterality Date   COLONOSCOPY  05/10/2011   DIAGNOSTIC MAMMOGRAM  10 /09/2010   ORIF HUMERUS FRACTURE Left 08/18/2017   Procedure: OPEN REDUCTION INTERNAL FIXATION (ORIF) LEFT PROXIMAL HUMERUS FRACTURE;  Surgeon: Cammy Copa, MD;  Location: [facility];  Service: Orthopedics;  Laterality: Left;   TIBIA FRACTURE SURGERY  04/2006   Left--From falling down the stairs   TIBIA FRACTURE SURGERY     In her 40's   Patient Active Problem List   Diagnosis Date Noted   Trochanteric bursitis, right hip 01/26/2022   Shoulder fracture, left 08/18/2017   Paroxysmal A-fib ([facility]) 09/08/2012    REFERRING DIAG: M70.61 (ICD-10-CM) - Trochanteric bursitis, right hip   THERAPY DIAG:  Pain in right hip  Muscle weakness (generalized)  Difficulty in walking, not elsewhere classified  Unsteadiness on feet  Cramp and spasm  PERTINENT HISTORY: see  above  PRECAUTIONS: none  SUBJECTIVE: Doing well today.  No pain.    PAIN:  Are you having pain? Yes: NPRS scale: 0/10 Pain location:  right lateral hip Pain description: discomfort Aggravating factors: walking Relieving factors: rest, meds     OBJECTIVE: (objective measures completed at initial evaluation unless otherwise dated)   OBJECTIVE:    DIAGNOSTIC FINDINGS: XR HIP UNILAT W OR W/O PELVIS 1V RIGHT (Nov 25, 2021)  An AP pelvis lateral right hip shows no acute findings or evidence of              stress fracture.  The hip joint space is well-maintained.    PATIENT SURVEYS:  FOTO 66 (goal 76), (03/04/22 improved to 75)   COGNITION:           Overall cognitive status: Within functional limits for tasks assessed                          SENSATION: WFL   MUSCLE LENGTH: Hamstrings: Right 60 deg; Left 60 deg (03/04/22 improved to approx 65 bilaterally) Thomas test: Right neg ; Left neg    POSTURE:  Trunk shift to right   PALPATION: Palpable tenderness originally at right hip no longer tender to palpation on 03/04/22   LE  ROM:   WNL   LE MMT:   Generally 4/5 with exception of hip abduction 3+/5 and hip extension 3+/5 bilaterally (03/04/22 Generally improved to 4+/5,   4-/5 bilateral hip ext and hip ext)   LOWER EXTREMITY SPECIAL TESTS:  Hip special tests: Luisa Hart (FABER) test: negative and Ober's test: positive (03/04/22 still slightly positive)       GAIT: Distance walked: 50 Assistive device utilized: None Level of assistance: Complete Independence Comments: antalgic       TODAY'S TREATMENT:  03/16/22 NuStep x 5 min level 5 Lateral band walks and monster walks fwd and backward x 3 laps each with blue loop Squat to table with 10 lb kb  2 x 10 Sit to stand on balance pad with overhead press with 3 lb dumbells 2 x 10 Marching on balance pad x 20  Fwd step over hurdles with uneven pads x 3 laps, step to gait Lateral step over hurdles with uneven pads x 3,  step to gait Step up and high knee on balance pad x 10 each LE fwd then lateral x 10 each LE Seated LAQ x 20 each LE with 6 lb ankle weights Seated march x 20 with 6 lb ankle weights Seated hip ER (bottom of foot to opposite ankle) with 6 lb ankle weight  Cone touches 3 x 10 1 D ball toss x 10 each LE with small blue plyo ball TODAY'S TREATMENT:  03/11/22 NuStep x 5 min level 5 Lateral band walks and monster walks fwd and backward x 3 laps each with blue band Squat to table with 10 lb kb  2 x 10 Sit to stand on balance pad with overhead press with 3 lb dumbells 2 x 10 Marching on balance pad x 20  Step up and high knee on balance pad x 10 each LE fwd then lateral x 10 each LE Seated LAQ x 20 each LE with 6 lb ankle weights Seated march x 20 with 6 lb ankle weights Seated hip ER (bottom of foot to opposite ankle) with 6 lb ankle weight  Cone touches 3 x 10 TODAY'S TREATMENT:  03/09/22 NuStep x 5 min level 5 Lateral band walks and monster walks fwd and backward x 3 laps each with blue band Squat to table with 10 lb  2 x 10 Sit to stand on balance pad with overhead press with 3 lb dumbells 2 x 10 Marching on balance pad x 20  Step up and high knee on balance pad x 10 each LE fwd then lateral x 10 each LE Seated LAQ x 20 each LE with 6 lb ankle weights Seated march x 20 with 6 lb ankle weights Cone touches 3 x 10     PATIENT EDUCATION:  Education details: Updated HEP Person educated: Patient Education method: Programmer, multimedia, Demonstration, Verbal cues, and Handouts Education comprehension: verbalized understanding, returned demonstration, and verbal cues required     HOME EXERCISE PROGRAM: Access Code: [email or web address] URL: [email or web address] Date: 03/04/2022 Prepared by: Mikey Kirschner  Program Notes All stretches hold 30 seconds.  Strengthening exercises: can start with 10 reps and increase to 2 sets of 10.    Exercises - Supine ITB Stretch with Strap  - 1 x daily -  7 x weekly - 1 sets - 3 reps - Supine Hamstring Stretch with Strap  - 1 x daily - 7 x weekly - 1 sets - 3 reps - Small Range Straight Leg Raise  - 1 x daily - 7 x  weekly - 2 sets - 10 reps - Sidelying Hip Abduction  - 1 x daily - 7 x weekly - 2 sets - 10 reps - Prone Hip Extension  - 1 x daily - 7 x weekly - 2 sets - 10 reps - Sidelying Hip Adduction  - 1 x daily - 7 x weekly - 2 sets - 10 reps - Clamshell with Resistance  - 1 x daily - 7 x weekly - 2 sets - 10 reps - Seated Hip Abduction  - 1 x daily - 7 x weekly - 2 sets - 10 reps - Side Stepping with Resistance at Ankles  - 1 x daily - 7 x weekly - 3 sets - 10 reps - Seated Piriformis Stretch with Trunk Bend  - 1 x daily - 7 x weekly - 1 sets - 3 reps - Seated Long Arc Quad  - 1 x daily - 7 x weekly - 3 sets - 10 reps - Seated Hip Flexion March with Ankle Weights  - 1 x daily - 7 x weekly - 2 sets - 10 reps - Squat with Chair Touch  - 1 x daily - 7 x weekly - 2 sets - 10 reps - Butterfly Groin Stretch  - 1 x daily - 7 x weekly - 1 sets - 10 reps - 10 sec hold   ASSESSMENT:   CLINICAL IMPRESSION: Clarese is reaching her final goals.  She has one visit left.  She is very compliant and well motivated.  She should continue to do well.     OBJECTIVE IMPAIRMENTS Abnormal gait, decreased balance, decreased mobility, difficulty walking, decreased ROM, decreased strength, increased fascial restrictions, increased muscle spasms, impaired flexibility, and pain.    ACTIVITY LIMITATIONS cleaning, community activity, driving, meal prep, laundry, yard work, and shopping.    PERSONAL FACTORS Age and Fitness are also affecting patient's functional outcome.      REHAB POTENTIAL: Good   CLINICAL DECISION MAKING: Stable/uncomplicated   EVALUATION COMPLEXITY: Low     GOALS: Goals reviewed with patient? Yes   SHORT TERM GOALS: Target date: 02/23/2022   Pain report to be no greater than 4/10  Baseline: Goal status: MET   2.  Patient will be  independent with initial HEP  Baseline:  Goal status: MET   3.  Patient to be able to walk 300 feet without need to stop and rest due to pain Baseline:  Goal status: MET     LONG TERM GOALS: Target date: 03/23/2022   Patient to be independent with advanced HEP  Baseline:  Goal status: INITIAL   2.  Patient to report pain no greater than 2/10  Baseline:  Goal status: INITIAL   3.  FOTO to be 76 Baseline:  Goal status: INITIAL   4.  Patient to report 75% improved confidence with walking and balance.   Baseline:  Goal status: INITIAL   5.  Patient to be able to walk or stand for 30 min without need to stop and rest due to pain Baseline:  Goal status: INITIAL         PLAN: PT FREQUENCY: 2x/week   PT DURATION: 8 weeks   PLANNED INTERVENTIONS: Therapeutic exercises, Therapeutic activity, Neuromuscular re-education, Balance training, Gait training, Patient/Family education, Joint mobilization, Stair training, DME instructions, Aquatic Therapy, Dry Needling, Electrical stimulation, Cryotherapy, Moist heat, Taping, Ultrasound, and Manual therapy   PLAN FOR NEXT SESSION: NuStep, balance training, progress hip strengthening and stability training, possibly add core strengthening  Victorino Dike B. Jeneva Schweizer, PT 03/16/22 9:32 AM  [facility] [address] [place], [place] [address] Phone # [phone] Fax [phone]

## 2022-03-18 ENCOUNTER — Ambulatory Visit: Payer: PPO

## 2022-03-18 DIAGNOSIS — M6281 Muscle weakness (generalized): Secondary | ICD-10-CM

## 2022-03-18 DIAGNOSIS — M25551 Pain in right hip: Secondary | ICD-10-CM

## 2022-03-18 DIAGNOSIS — R252 Cramp and spasm: Secondary | ICD-10-CM

## 2022-03-18 DIAGNOSIS — R262 Difficulty in walking, not elsewhere classified: Secondary | ICD-10-CM

## 2022-03-18 DIAGNOSIS — R2681 Unsteadiness on feet: Secondary | ICD-10-CM

## 2022-03-18 NOTE — Therapy (Signed)
OUTPATIENT PHYSICAL THERAPY DISCHARGE NOTE   Patient Name: Alyssa Martin MRN: [id number] DOB:02/09/1946, 76 y.o., female Today's Date: 03/18/2022  PCP: Ginger Organ., MD REFERRING PROVIDER: Mcarthur Rossetti, MD  END OF SESSION:   PT End of Session - 03/18/22 0852     Visit Number 14    Date for PT Re-Evaluation 03/23/22    Authorization Type [employer]    Progress Note Due on Visit 20    PT Start Time 0847    PT Stop Time 0930    PT Time Calculation (min) 43 min    Activity Tolerance Patient tolerated treatment well    Behavior During Therapy [facility] for tasks assessed/performed              Past Medical History:  Diagnosis Date   Arthritis    Closed fracture of left proximal humerus    Closed right ankle fracture    Depression with anxiety    Elevated BP    GERD (gastroesophageal reflux disease)    Hyperlipidemia    Hypertension    Impaired fasting glucose    Osteopenia    Osteoporosis    Paroxysmal atrial fibrillation ([place])    Vitamin D deficiency    Wears glasses    Past Surgical History:  Procedure Laterality Date   COLONOSCOPY  05/10/2011   DIAGNOSTIC MAMMOGRAM  10 /09/2010   ORIF HUMERUS FRACTURE Left 08/18/2017   Procedure: OPEN REDUCTION INTERNAL FIXATION (ORIF) LEFT PROXIMAL HUMERUS FRACTURE;  Surgeon: Meredith Pel, MD;  Location: [place];  Service: Orthopedics;  Laterality: Left;   TIBIA FRACTURE SURGERY  04/2006   Left--From falling down the stairs   TIBIA FRACTURE SURGERY     In her 40's   Patient Active Problem List   Diagnosis Date Noted   Trochanteric bursitis, right hip 01/26/2022   Shoulder fracture, left 08/18/2017   Paroxysmal A-fib ([place]) 09/08/2012    REFERRING DIAG: M70.61 (ICD-10-CM) - Trochanteric bursitis, right hip   THERAPY DIAG:  Pain in right hip  Muscle weakness (generalized)  Difficulty in walking, not elsewhere classified  Cramp and spasm  Unsteadiness on feet  PERTINENT HISTORY: see  above  PRECAUTIONS: none  SUBJECTIVE: Doing well today.  No pain.    PAIN:  Are you having pain? Yes: NPRS scale: 0/10 Pain location:  right lateral hip Pain description: discomfort Aggravating factors: walking Relieving factors: rest, meds     OBJECTIVE: (objective measures completed at initial evaluation unless otherwise dated)   OBJECTIVE:    DIAGNOSTIC FINDINGS: XR HIP UNILAT W OR W/O PELVIS 1V RIGHT (Nov 25, 2021)  An AP pelvis lateral right hip shows no acute findings or evidence of              stress fracture.  The hip joint space is well-maintained.    PATIENT SURVEYS:  FOTO 66 (goal 54), (03/04/22 improved to 75), (03/18/22 75)   COGNITION:           Overall cognitive status: Within functional limits for tasks assessed                          SENSATION: WFL   MUSCLE LENGTH: Hamstrings: Right 60 deg; Left 60 deg (03/04/22 improved to approx 65 bilaterally)(03/18/22 WFL) Thomas test: Right neg ; Left neg    POSTURE:  Trunk shift to right (03/18/22 Patient is well aligned and minimally shifted)   PALPATION: Palpable tenderness originally at right  hip no longer tender to palpation on 03/04/22 (03/18/22 minimally tender at right lateral hip to very deep palpation)   LE ROM:   WNL   LE MMT:   Generally 4/5 with exception of hip abduction 3+/5 and hip extension 3+/5 bilaterally (03/04/22 Generally improved to 4+/5,   4-/5 bilateral hip ext and hip ext)   LOWER EXTREMITY SPECIAL TESTS:  Hip special tests: Saralyn Pilar (FABER) test: negative and Ober's test: positive (03/04/22 still slightly positive)       GAIT: Distance walked: 50 Assistive device utilized: None Level of assistance: Complete Independence Comments: antalgic  Patient is now able to walk 2-3 miles at this point.  She also has a recumbent bike as well that she uses when the weather is bad.      TODAY'S TREATMENT:  03/18/22 NuStep x 5 min level 5 RE-assessment for DC Review HEP and how to  progress Lateral band walks  x 3 laps with blue loop Squat to table with 10 lb kb  2 x 10 Cone touches 3 x 10 1 D ball toss x 10 each LE with small blue plyo ball TODAY'S TREATMENT:  03/16/22 NuStep x 5 min level 5 Lateral band walks and monster walks fwd and backward x 3 laps each with blue loop Squat to table with 10 lb kb  2 x 10 Sit to stand on balance pad with overhead press with 3 lb dumbells 2 x 10 Marching on balance pad x 20  Fwd step over hurdles with uneven pads x 3 laps, step to gait Lateral step over hurdles with uneven pads x 3, step to gait Step up and high knee on balance pad x 10 each LE fwd then lateral x 10 each LE Seated LAQ x 20 each LE with 6 lb ankle weights Seated march x 20 with 6 lb ankle weights Seated hip ER (bottom of foot to opposite ankle) with 6 lb ankle weight  Cone touches 3 x 10 1 D ball toss x 10 each LE with small blue plyo ball TODAY'S TREATMENT:  03/11/22 NuStep x 5 min level 5 Lateral band walks and monster walks fwd and backward x 3 laps each with blue band Squat to table with 10 lb kb  2 x 10 Sit to stand on balance pad with overhead press with 3 lb dumbells 2 x 10 Marching on balance pad x 20  Step up and high knee on balance pad x 10 each LE fwd then lateral x 10 each LE Seated LAQ x 20 each LE with 6 lb ankle weights Seated march x 20 with 6 lb ankle weights Seated hip ER (bottom of foot to opposite ankle) with 6 lb ankle weight  Cone touches 3 x 10   PATIENT EDUCATION:  Education details: Updated HEP and how to progress for DC Person educated: Patient Education method: Consulting civil engineer, Media planner, Verbal cues, and Handouts Education comprehension: verbalized understanding, returned demonstration, and verbal cues required     HOME EXERCISE PROGRAM: Access Code: [email or web address] URL: [email or web address] Date: 03/04/2022 Prepared by: Candyce Churn  Program Notes All stretches hold 30 seconds.  Strengthening exercises: can  start with 10 reps and increase to 2 sets of 10.    Exercises - Supine ITB Stretch with Strap  - 1 x daily - 7 x weekly - 1 sets - 3 reps - Supine Hamstring Stretch with Strap  - 1 x daily - 7 x weekly - 1 sets - 3 reps - Small Range Straight Leg  Raise  - 1 x daily - 7 x weekly - 2 sets - 10 reps - Sidelying Hip Abduction  - 1 x daily - 7 x weekly - 2 sets - 10 reps - Prone Hip Extension  - 1 x daily - 7 x weekly - 2 sets - 10 reps - Sidelying Hip Adduction  - 1 x daily - 7 x weekly - 2 sets - 10 reps - Clamshell with Resistance  - 1 x daily - 7 x weekly - 2 sets - 10 reps - Seated Hip Abduction  - 1 x daily - 7 x weekly - 2 sets - 10 reps - Side Stepping with Resistance at Ankles  - 1 x daily - 7 x weekly - 3 sets - 10 reps - Seated Piriformis Stretch with Trunk Bend  - 1 x daily - 7 x weekly - 1 sets - 3 reps - Seated Long Arc Quad  - 1 x daily - 7 x weekly - 3 sets - 10 reps - Seated Hip Flexion March with Ankle Weights  - 1 x daily - 7 x weekly - 2 sets - 10 reps - Squat with Chair Touch  - 1 x daily - 7 x weekly - 2 sets - 10 reps - Butterfly Groin Stretch  - 1 x daily - 7 x weekly - 1 sets - 10 reps - 10 sec hold   ASSESSMENT:   CLINICAL IMPRESSION: Jesse has met all goals.   She is very compliant and well motivated.  She demonstrates much improved balance strategies and protective responses.  She has resumed a long distance walking program with a friend.  She will be joining a Archivist and will continue her HEP.  She returns understanding of her condition and of her HEP.  She should continue to do well.     OBJECTIVE IMPAIRMENTS Abnormal gait, decreased balance, decreased mobility, difficulty walking, decreased ROM, decreased strength, increased fascial restrictions, increased muscle spasms, impaired flexibility, and pain.    ACTIVITY LIMITATIONS cleaning, community activity, driving, meal prep, laundry, yard work, and shopping.    PERSONAL FACTORS Age and Fitness are  also affecting patient's functional outcome.      REHAB POTENTIAL: Good   CLINICAL DECISION MAKING: Stable/uncomplicated   EVALUATION COMPLEXITY: Low     GOALS: Goals reviewed with patient? Yes   SHORT TERM GOALS: Target date: 02/23/2022   Pain report to be no greater than 4/10  Baseline: Goal status: MET   2.  Patient will be independent with initial HEP  Baseline:  Goal status: MET   3.  Patient to be able to walk 300 feet without need to stop and rest due to pain Baseline:  Goal status: MET     LONG TERM GOALS: Target date: 03/23/2022   Patient to be independent with advanced HEP  Baseline:  Goal status: MET   2.  Patient to report pain no greater than 2/10  Baseline:  Goal status: MET   3.  FOTO to be 76 Baseline:  Goal status: Final score 75   4.  Patient to report 75% improved confidence with walking and balance.   Baseline:  Goal status: MET (patient reports she is 90% better and more confident)   5.  Patient to be able to walk or stand for 30 min without need to stop and rest due to pain Baseline:  Goal status: MET (patient walking up to 3 miles today)  PLAN: PT FREQUENCY: 2x/week   PT DURATION: 8 weeks   PLANNED INTERVENTIONS: Therapeutic exercises, Therapeutic activity, Neuromuscular re-education, Balance training, Gait training, Patient/Family education, Joint mobilization, Stair training, DME instructions, Aquatic Therapy, Dry Needling, Electrical stimulation, Cryotherapy, Moist heat, Taping, Ultrasound, and Manual therapy   PLAN FOR NEXT SESSION: We will DC at this time.    PHYSICAL THERAPY DISCHARGE SUMMARY  Visits from Start of Care: 14  Current functional level related to goals / functional outcomes: See above   Remaining deficits: See above   Education / Equipment: See above   Patient agrees to discharge. Patient goals were met. Patient is being discharged due to meeting the stated rehab goals.    Anderson Malta B. Joniece Smotherman,  PT 03/18/22 9:40 AM  [facility] [address], [place],  [address] Phone # [phone] Fax [phone]

## 2022-04-05 DIAGNOSIS — J029 Acute pharyngitis, unspecified: Secondary | ICD-10-CM | POA: Diagnosis not present

## 2022-04-05 DIAGNOSIS — H6592 Unspecified nonsuppurative otitis media, left ear: Secondary | ICD-10-CM | POA: Diagnosis not present

## 2022-04-05 DIAGNOSIS — J309 Allergic rhinitis, unspecified: Secondary | ICD-10-CM | POA: Diagnosis not present

## 2022-04-05 DIAGNOSIS — I480 Paroxysmal atrial fibrillation: Secondary | ICD-10-CM | POA: Diagnosis not present

## 2022-04-05 DIAGNOSIS — Z1152 Encounter for screening for COVID-19: Secondary | ICD-10-CM | POA: Diagnosis not present

## 2022-04-16 DIAGNOSIS — R0981 Nasal congestion: Secondary | ICD-10-CM | POA: Diagnosis not present

## 2022-04-16 DIAGNOSIS — I480 Paroxysmal atrial fibrillation: Secondary | ICD-10-CM | POA: Diagnosis not present

## 2022-04-16 DIAGNOSIS — J019 Acute sinusitis, unspecified: Secondary | ICD-10-CM | POA: Diagnosis not present

## 2022-04-16 DIAGNOSIS — H6592 Unspecified nonsuppurative otitis media, left ear: Secondary | ICD-10-CM | POA: Diagnosis not present

## 2022-04-16 DIAGNOSIS — J309 Allergic rhinitis, unspecified: Secondary | ICD-10-CM | POA: Diagnosis not present

## 2022-04-16 DIAGNOSIS — B9689 Other specified bacterial agents as the cause of diseases classified elsewhere: Secondary | ICD-10-CM | POA: Diagnosis not present

## 2022-04-26 ENCOUNTER — Other Ambulatory Visit: Payer: Self-pay | Admitting: Cardiology

## 2022-05-12 ENCOUNTER — Telehealth: Payer: Self-pay | Admitting: Cardiology

## 2022-05-12 NOTE — Telephone Encounter (Signed)
Pt c/o medication issue:  1. Name of Medication:   rivaroxaban (XARELTO) 20 MG TABS tablet    2. How are you currently taking this medication (dosage and times per day)? TAKE 1 TABLET BY MOUTH DAILY WITH SUPPER.  3. Are you having a reaction (difficulty breathing--STAT)? No  4. What is your medication issue? Pt has a question regarding medication. She would like a callback from nurse. Please advise

## 2022-05-12 NOTE — Telephone Encounter (Signed)
Patient informed she can use therapy pool.

## 2022-05-12 NOTE — Telephone Encounter (Signed)
Patient goes to water fitness and is asking if it is okay for her also to use the therapy pool with water temperature from 92-94 degrees. Please advise.

## 2022-06-01 DIAGNOSIS — Z85820 Personal history of malignant melanoma of skin: Secondary | ICD-10-CM | POA: Diagnosis not present

## 2022-06-01 DIAGNOSIS — L738 Other specified follicular disorders: Secondary | ICD-10-CM | POA: Diagnosis not present

## 2022-06-01 DIAGNOSIS — D225 Melanocytic nevi of trunk: Secondary | ICD-10-CM | POA: Diagnosis not present

## 2022-06-01 DIAGNOSIS — D1801 Hemangioma of skin and subcutaneous tissue: Secondary | ICD-10-CM | POA: Diagnosis not present

## 2022-06-01 DIAGNOSIS — D485 Neoplasm of uncertain behavior of skin: Secondary | ICD-10-CM | POA: Diagnosis not present

## 2022-06-01 DIAGNOSIS — L905 Scar conditions and fibrosis of skin: Secondary | ICD-10-CM | POA: Diagnosis not present

## 2022-06-01 DIAGNOSIS — D2271 Melanocytic nevi of right lower limb, including hip: Secondary | ICD-10-CM | POA: Diagnosis not present

## 2022-06-01 DIAGNOSIS — L821 Other seborrheic keratosis: Secondary | ICD-10-CM | POA: Diagnosis not present

## 2022-06-01 DIAGNOSIS — D2262 Melanocytic nevi of left upper limb, including shoulder: Secondary | ICD-10-CM | POA: Diagnosis not present

## 2022-06-01 DIAGNOSIS — L814 Other melanin hyperpigmentation: Secondary | ICD-10-CM | POA: Diagnosis not present

## 2022-06-01 DIAGNOSIS — D2261 Melanocytic nevi of right upper limb, including shoulder: Secondary | ICD-10-CM | POA: Diagnosis not present

## 2022-07-27 DIAGNOSIS — N950 Postmenopausal bleeding: Secondary | ICD-10-CM | POA: Diagnosis not present

## 2022-08-06 DIAGNOSIS — Z961 Presence of intraocular lens: Secondary | ICD-10-CM | POA: Diagnosis not present

## 2022-08-20 DIAGNOSIS — R7301 Impaired fasting glucose: Secondary | ICD-10-CM | POA: Diagnosis not present

## 2022-08-20 DIAGNOSIS — E559 Vitamin D deficiency, unspecified: Secondary | ICD-10-CM | POA: Diagnosis not present

## 2022-08-20 DIAGNOSIS — E785 Hyperlipidemia, unspecified: Secondary | ICD-10-CM | POA: Diagnosis not present

## 2022-08-20 DIAGNOSIS — I10 Essential (primary) hypertension: Secondary | ICD-10-CM | POA: Diagnosis not present

## 2022-08-23 DIAGNOSIS — N952 Postmenopausal atrophic vaginitis: Secondary | ICD-10-CM | POA: Diagnosis not present

## 2022-08-23 DIAGNOSIS — N950 Postmenopausal bleeding: Secondary | ICD-10-CM | POA: Diagnosis not present

## 2022-08-27 DIAGNOSIS — I10 Essential (primary) hypertension: Secondary | ICD-10-CM | POA: Diagnosis not present

## 2022-08-27 DIAGNOSIS — D6869 Other thrombophilia: Secondary | ICD-10-CM | POA: Diagnosis not present

## 2022-08-27 DIAGNOSIS — E785 Hyperlipidemia, unspecified: Secondary | ICD-10-CM | POA: Diagnosis not present

## 2022-08-27 DIAGNOSIS — Z0000 Encounter for general adult medical examination without abnormal findings: Secondary | ICD-10-CM | POA: Diagnosis not present

## 2022-08-27 DIAGNOSIS — R7301 Impaired fasting glucose: Secondary | ICD-10-CM | POA: Diagnosis not present

## 2022-08-27 DIAGNOSIS — H9202 Otalgia, left ear: Secondary | ICD-10-CM | POA: Diagnosis not present

## 2022-08-27 DIAGNOSIS — H8110 Benign paroxysmal vertigo, unspecified ear: Secondary | ICD-10-CM | POA: Diagnosis not present

## 2022-08-27 DIAGNOSIS — Z1331 Encounter for screening for depression: Secondary | ICD-10-CM | POA: Diagnosis not present

## 2022-08-27 DIAGNOSIS — I480 Paroxysmal atrial fibrillation: Secondary | ICD-10-CM | POA: Diagnosis not present

## 2022-08-27 DIAGNOSIS — Z7901 Long term (current) use of anticoagulants: Secondary | ICD-10-CM | POA: Diagnosis not present

## 2022-08-27 DIAGNOSIS — Z1339 Encounter for screening examination for other mental health and behavioral disorders: Secondary | ICD-10-CM | POA: Diagnosis not present

## 2022-08-27 DIAGNOSIS — R82998 Other abnormal findings in urine: Secondary | ICD-10-CM | POA: Diagnosis not present

## 2022-08-27 DIAGNOSIS — E559 Vitamin D deficiency, unspecified: Secondary | ICD-10-CM | POA: Diagnosis not present

## 2022-08-27 DIAGNOSIS — M810 Age-related osteoporosis without current pathological fracture: Secondary | ICD-10-CM | POA: Diagnosis not present

## 2022-08-27 DIAGNOSIS — H9312 Tinnitus, left ear: Secondary | ICD-10-CM | POA: Diagnosis not present

## 2022-09-21 DIAGNOSIS — J343 Hypertrophy of nasal turbinates: Secondary | ICD-10-CM | POA: Diagnosis not present

## 2022-09-21 DIAGNOSIS — H9042 Sensorineural hearing loss, unilateral, left ear, with unrestricted hearing on the contralateral side: Secondary | ICD-10-CM | POA: Diagnosis not present

## 2022-09-21 DIAGNOSIS — J310 Chronic rhinitis: Secondary | ICD-10-CM | POA: Diagnosis not present

## 2022-09-21 DIAGNOSIS — H9202 Otalgia, left ear: Secondary | ICD-10-CM | POA: Diagnosis not present

## 2022-09-21 DIAGNOSIS — H9312 Tinnitus, left ear: Secondary | ICD-10-CM | POA: Diagnosis not present

## 2022-09-21 NOTE — Progress Notes (Signed)
Cardiology Office Note   Date:  09/27/2022   ID:  Alyssa, Martin 1945/12/01, MRN [id number]  PCP:  Ginger Organ., MD  Cardiologist:   Makyia Erxleben Martinique, MD   Chief Complaint  Patient presents with   Atrial Fibrillation     History of Present Illness: Alyssa Martin is a 77 y.o. female who presents for follow up Afib. She has a history of paroxysmal atrial fibrillation dating back to 2013.  She has been on cardizem and Xarelto.  She denies any significant palpitations over the past year. Now has an Apple watch.  No dizziness, tachycardia, chest pain or SOB. no bleeding. She is a member at [employer] doing balance classes and water aerobics.     Past Medical History:  Diagnosis Date   Arthritis    Closed fracture of left proximal humerus    Closed right ankle fracture    Depression with anxiety    Elevated BP    GERD (gastroesophageal reflux disease)    Hyperlipidemia    Hypertension    Impaired fasting glucose    Osteopenia    Osteoporosis    Paroxysmal atrial fibrillation ([facility])    Vitamin D deficiency    Wears glasses     Past Surgical History:  Procedure Laterality Date   COLONOSCOPY  05/10/2011   DIAGNOSTIC MAMMOGRAM  10 /09/2010   ORIF HUMERUS FRACTURE Left 08/18/2017   Procedure: OPEN REDUCTION INTERNAL FIXATION (ORIF) LEFT PROXIMAL HUMERUS FRACTURE;  Surgeon: Meredith Pel, MD;  Location: [place];  Service: Orthopedics;  Laterality: Left;   TIBIA FRACTURE SURGERY  04/2006   Left--From falling down the stairs   TIBIA FRACTURE SURGERY     In her 40's     Current Outpatient Medications  Medication Sig Dispense Refill   Calcium Citrate (CITRACAL PO) Take 630 mg by mouth 2 (two) times daily.      Coenzyme Q10 (COQ10) 200 MG CAPS Take 200 mg daily     diltiazem (DILT-XR) 180 MG 24 hr capsule Take 1 capsule (180 mg total) by mouth daily. 90 capsule 3   ergocalciferol (VITAMIN D2) 50000 UNITS capsule Take 50,000 Units by mouth every 14 (fourteen) days.      Estradiol 10 MCG TABS vaginal tablet Yuvafem 10 mcg vaginal tablet     famotidine (PEPCID) 20 MG tablet Take 20 mg by mouth as needed for heartburn or indigestion.     loratadine (CLARITIN) 10 MG tablet Take 10 mg by mouth daily as needed for allergies.      mometasone (NASONEX) 50 MCG/ACT nasal spray Place 2 sprays into the nose daily as needed (allergies).      Multiple Vitamin (MULTIVITAMIN) tablet Take 1 tablet by mouth daily.     rivaroxaban (XARELTO) 20 MG TABS tablet TAKE 1 TABLET BY MOUTH DAILY WITH SUPPER. 90 tablet 3   simvastatin (ZOCOR) 20 MG tablet Take 20 mg by mouth every evening.     vitamin C (ASCORBIC ACID) 500 MG tablet Take 500 mg by mouth daily.     zoledronic acid (RECLAST) 5 MG/100ML SOLN injection Inject 5 mg into the vein once. Yearly     No current facility-administered medications for this visit.    Allergies:   Latex, Penicillins, and Ativan [lorazepam]    Social History:  The patient  reports that she has never smoked. She has never used smokeless tobacco. She reports that she does not drink alcohol and does not use drugs.  Family History:  The patient's family history includes Bone cancer in her brother and father; Hypertension in her mother; Leukemia in her paternal grandfather; Lung cancer in her brother and paternal grandmother; Osteoarthritis in her mother; Osteopenia in her father.    ROS:  Please see the history of present illness.   Otherwise, review of systems are positive for none.   All other systems are reviewed and negative.    PHYSICAL EXAM: VS:  BP 120/78   Pulse 77   Ht '4\' 11"'$  (1.499 m)   Wt 128 lb 3.2 oz (58.2 kg)   BMI 25.89 kg/m  , BMI Body mass index is 25.89 kg/m. GEN: Well nourished, well developed, in no acute distress  HEENT: normal  Neck: no JVD, carotid bruits, or masses Cardiac: IRRR; no murmurs, rubs, or gallops,no edema  Respiratory:  clear to auscultation bilaterally, normal work of breathing GI: soft, nontender,  nondistended, + BS MS: no deformity or atrophy  Skin: warm and dry, no rash Neuro:  Strength and sensation are intact Psych: euthymic mood, full affect   EKG:  EKG is ordered today. The ekg ordered today demonstrates NSR, Normal Ecg. I have personally reviewed and interpreted this study.    Recent Labs: No results found for requested labs within last 365 days.    Lipid Panel No results found for: "CHOL", "TRIG", "HDL", "CHOLHDL", "VLDL", "LDLCALC", "LDLDIRECT"   Dated 07/17/19: cholesterol 161, triglycerides 83, HDL 65, LDL79. A1c 5.5%. normal CBC and TSH Dated 11/22/19: normal CMET. Dated 08/19/21: cholesterol 155, triglycerides 82, HDL 53, LDL 86. A1c 5.5%. CMET and TSH normal. Dated 08/20/22: cholesterol 157, triglycerides 91, HDL 64, LDL 75. A1c 5.3%. CMET and TSH normal  Wt Readings from Last 3 Encounters:  09/27/22 128 lb 3.2 oz (58.2 kg)  08/27/21 125 lb 6.4 oz (56.9 kg)  03/26/21 125 lb (56.7 kg)      ASSESSMENT AND PLAN:  1. Paroxysmal atrial fibrillation.  Currently asymptomatic. Good rate control on diltiazem. On Xarelto for anticoagulation.      Current medicines are reviewed at length with the patient today.  The patient does not have concerns regarding medicines.  The following changes have been made:  no change  Labs/ tests ordered today include:   No orders of the defined types were placed in this encounter.     Disposition:   FU with me in 1 year  Signed, Mitcheal Sweetin Martinique, MD  09/27/2022 2:42 PM    [facility] [address], [place], [place], [address] Phone [phone], Fax [phone]

## 2022-09-22 ENCOUNTER — Other Ambulatory Visit: Payer: Self-pay | Admitting: Otolaryngology

## 2022-09-22 DIAGNOSIS — H918X3 Other specified hearing loss, bilateral: Secondary | ICD-10-CM

## 2022-09-27 ENCOUNTER — Ambulatory Visit: Payer: PPO | Attending: Cardiology | Admitting: Cardiology

## 2022-09-27 ENCOUNTER — Encounter: Payer: Self-pay | Admitting: Cardiology

## 2022-09-27 VITALS — BP 120/78 | HR 77 | Ht 59.0 in | Wt 128.2 lb

## 2022-09-27 DIAGNOSIS — I480 Paroxysmal atrial fibrillation: Secondary | ICD-10-CM | POA: Diagnosis not present

## 2022-10-10 ENCOUNTER — Ambulatory Visit
Admission: RE | Admit: 2022-10-10 | Discharge: 2022-10-10 | Disposition: A | Discharge status: Home / Self Care | Payer: PPO | Source: Ambulatory Visit | Attending: Otolaryngology | Admitting: Otolaryngology

## 2022-10-10 DIAGNOSIS — H918X3 Other specified hearing loss, bilateral: Secondary | ICD-10-CM

## 2022-10-10 DIAGNOSIS — I6782 Cerebral ischemia: Secondary | ICD-10-CM | POA: Diagnosis not present

## 2022-10-10 MED ORDER — GADOPICLENOL 0.5 MMOL/ML IV SOLN
7.5000 mL | Freq: Once | INTRAVENOUS | Status: AC | PRN
  Administered 2022-10-10: 7.5 mL via INTRAVENOUS

## 2022-10-12 DIAGNOSIS — Z6826 Body mass index (BMI) 26.0-26.9, adult: Secondary | ICD-10-CM | POA: Diagnosis not present

## 2022-10-12 DIAGNOSIS — N952 Postmenopausal atrophic vaginitis: Secondary | ICD-10-CM | POA: Diagnosis not present

## 2022-10-12 DIAGNOSIS — Z1231 Encounter for screening mammogram for malignant neoplasm of breast: Secondary | ICD-10-CM | POA: Diagnosis not present

## 2022-10-12 DIAGNOSIS — Z01419 Encounter for gynecological examination (general) (routine) without abnormal findings: Secondary | ICD-10-CM | POA: Diagnosis not present

## 2022-10-18 ENCOUNTER — Other Ambulatory Visit: Payer: Self-pay | Admitting: Obstetrics and Gynecology

## 2022-10-18 DIAGNOSIS — R928 Other abnormal and inconclusive findings on diagnostic imaging of breast: Secondary | ICD-10-CM

## 2022-11-02 ENCOUNTER — Ambulatory Visit: Payer: PPO

## 2022-11-02 ENCOUNTER — Ambulatory Visit
Admission: RE | Admit: 2022-11-02 | Discharge: 2022-11-02 | Disposition: A | Discharge status: Home / Self Care | Payer: PPO | Source: Ambulatory Visit | Attending: Obstetrics and Gynecology | Admitting: Obstetrics and Gynecology

## 2022-11-02 DIAGNOSIS — R928 Other abnormal and inconclusive findings on diagnostic imaging of breast: Secondary | ICD-10-CM | POA: Diagnosis not present

## 2022-11-15 DIAGNOSIS — H9312 Tinnitus, left ear: Secondary | ICD-10-CM | POA: Diagnosis not present

## 2022-11-15 DIAGNOSIS — J343 Hypertrophy of nasal turbinates: Secondary | ICD-10-CM | POA: Diagnosis not present

## 2022-11-15 DIAGNOSIS — J310 Chronic rhinitis: Secondary | ICD-10-CM | POA: Diagnosis not present

## 2022-11-15 DIAGNOSIS — H9042 Sensorineural hearing loss, unilateral, left ear, with unrestricted hearing on the contralateral side: Secondary | ICD-10-CM | POA: Diagnosis not present

## 2022-12-15 DIAGNOSIS — H9042 Sensorineural hearing loss, unilateral, left ear, with unrestricted hearing on the contralateral side: Secondary | ICD-10-CM | POA: Diagnosis not present

## 2022-12-15 DIAGNOSIS — R42 Dizziness and giddiness: Secondary | ICD-10-CM | POA: Diagnosis not present

## 2022-12-30 DIAGNOSIS — Z85820 Personal history of malignant melanoma of skin: Secondary | ICD-10-CM | POA: Diagnosis not present

## 2022-12-30 DIAGNOSIS — L309 Dermatitis, unspecified: Secondary | ICD-10-CM | POA: Diagnosis not present

## 2022-12-30 DIAGNOSIS — R42 Dizziness and giddiness: Secondary | ICD-10-CM | POA: Diagnosis not present

## 2023-01-06 DIAGNOSIS — R42 Dizziness and giddiness: Secondary | ICD-10-CM | POA: Diagnosis not present

## 2023-01-06 DIAGNOSIS — H9042 Sensorineural hearing loss, unilateral, left ear, with unrestricted hearing on the contralateral side: Secondary | ICD-10-CM | POA: Diagnosis not present

## 2023-01-28 ENCOUNTER — Ambulatory Visit: Payer: PPO | Attending: Otolaryngology

## 2023-01-28 DIAGNOSIS — R2681 Unsteadiness on feet: Secondary | ICD-10-CM

## 2023-01-28 DIAGNOSIS — R42 Dizziness and giddiness: Secondary | ICD-10-CM | POA: Diagnosis not present

## 2023-01-28 NOTE — Therapy (Signed)
OUTPATIENT PHYSICAL THERAPY VESTIBULAR EVALUATION     Patient Name: Alyssa Martin MRN: [id number] DOB:1946-07-10, 77 y.o., female Today's Date: 01/28/2023  END OF SESSION:  PT End of Session - 01/28/23 0815     Visit Number 1    Number of Visits 5    Date for PT Re-Evaluation 02/25/23    Authorization Type HTA    PT Start Time 0812   patient late   PT Stop Time 0850    PT Time Calculation (min) 38 min    Activity Tolerance Patient tolerated treatment well    Behavior During Therapy [facility] for tasks assessed/performed             Past Medical History:  Diagnosis Date   Arthritis    Closed fracture of left proximal humerus    Closed right ankle fracture    Depression with anxiety    Elevated BP    GERD (gastroesophageal reflux disease)    Hyperlipidemia    Hypertension    Impaired fasting glucose    Osteopenia    Osteoporosis    Paroxysmal atrial fibrillation ([facility])    Vitamin D deficiency    Wears glasses    Past Surgical History:  Procedure Laterality Date   COLONOSCOPY  05/10/2011   DIAGNOSTIC MAMMOGRAM  10 /09/2010   ORIF HUMERUS FRACTURE Left 08/18/2017   Procedure: OPEN REDUCTION INTERNAL FIXATION (ORIF) LEFT PROXIMAL HUMERUS FRACTURE;  Surgeon: Cammy Copa, MD;  Location: [facility];  Service: Orthopedics;  Laterality: Left;   TIBIA FRACTURE SURGERY  04/2006   Left--From falling down the stairs   TIBIA FRACTURE SURGERY     In her 40's   Patient Active Problem List   Diagnosis Date Noted   Trochanteric bursitis, right hip 01/26/2022   Shoulder fracture, left 08/18/2017   Paroxysmal A-fib ([facility]) 09/08/2012    PCP: Martha Clan, MD REFERRING PROVIDER: Karle Barr, MD  REFERRING DIAG: R42 (ICD-10-CM) - Dizziness  THERAPY DIAG:  Dizziness and giddiness  Unsteadiness on feet  ONSET DATE: 01/25/2023 referral  Rationale for Evaluation and Treatment: Rehabilitation  SUBJECTIVE:   SUBJECTIVE STATEMENT: Patient arrive to clinic alone, no AD.  Went to ENT and noted decreased L hearing. States she's here for balance. Does have a L hearing aid. Has had this issue before and Meclizine helped. Thinks some of her issues may be related to allergies. States she has "really bad" dizziness a couple times a month. Does report episodic dizziness.  Pt accompanied by: self  PERTINENT HISTORY: arthritis, depression/anxiety, HTN, GERD, HLD, osteopenia/osteoporosis, paroxysmal a-fib, vitamin D deficiency   PAIN:  Are you having pain? No  PRECAUTIONS: Fall  WEIGHT BEARING RESTRICTIONS: No  FALLS: Has patient fallen in last 6 months? No  LIVING ENVIRONMENT: Lives with: lives alone Lives in: House/apartment Stairs: No Has following equipment at home: Grab bars  PLOF: Independent  PATIENT GOALS: "I want to know I'm not going to fall"  OBJECTIVE:   DIAGNOSTIC FINDINGS: IMPRESSION: 1. No cause of the presenting symptoms is identified. No vestibular schwannoma or other CP angle lesion. No fluid in the middle ears or mastoid air cells. Inner ear structures appear normal by MRI. 2. Chronic small-vessel ischemic change of the cerebral hemispheric white matter of the forceps major regions.  COGNITION: Overall cognitive status: Within functional limits for tasks assessed   SENSATION: WFL  POSTURE:  rounded shoulders, forward head, increased thoracic kyphosis, posterior pelvic tilt, and flexed trunk   Cervical ROM:   [employer]  STRENGTH: WFL   BED MOBILITY:  Independent, no dizziness  TRANSFERS: Assistive device utilized: None  Sit to stand: Complete Independence Stand to sit: Complete Independence Chair to chair: Complete Independence  GAIT: Gait pattern: WFL  FUNCTIONAL TESTS:  M-CTSIB: Condition 1: 30s no sway  Condition 2: 30s moderate sway  Condition 3: 30s mild sway  Condition 4: 10s   PATIENT SURVEYS:  FOTO staff did not capture  VESTIBULAR ASSESSMENT:  GENERAL OBSERVATION: NAD   SYMPTOM  BEHAVIOR:  Subjective history: see above  Non-Vestibular symptoms: headaches, tinnitus, nausea/vomiting, and L ear pain  Type of dizziness: Imbalance (Disequilibrium) and "floating sensation"  Frequency: a few times a month  Duration: varies  Aggravating factors: Spontaneous  Relieving factors: medication and rest  Progression of symptoms: worse  OCULOMOTOR EXAM:  Ocular Alignment: normal  Ocular ROM: No Limitations  Spontaneous Nystagmus: absent  Gaze-Induced Nystagmus: absent  Smooth Pursuits: intact  Saccades: intact  Convergence/Divergence: ~10 cm   VESTIBULAR - OCULAR REFLEX:   Slow VOR: Normal  VOR Cancellation: Normal  Head-Impulse Test: HIT Right: negative HIT Left: negative     POSITIONAL TESTING: not indicated  MOTION SENSITIVITY:  Motion Sensitivity Quotient Intensity: 0 = none, 1 = Lightheaded, 2 = Mild, 3 = Moderate, 4 = Severe, 5 = Vomiting  Intensity  1. Sitting to supine   2. Supine to L side   3. Supine to R side   4. Supine to sitting   5. L Hallpike-Dix   6. Up from L    7. R Hallpike-Dix   8. Up from R    9. Sitting, head tipped to L knee 0  10. Head up from L knee 0  11. Sitting, head tipped to R knee 0  12. Head up from R knee 0  13. Sitting head turns x5 1  14.Sitting head nods x5 0  15. In stance, 180 turn to L  1  16. In stance, 180 turn to R 1    VESTIBULAR TREATMENT:                                                                                                   N/a eval  PATIENT EDUCATION: Education details: PT POC, exam findings Person educated: Patient Education method: Explanation Education comprehension: verbalized understanding and needs further education  HOME EXERCISE PROGRAM: To be provided  GOALS: Goals reviewed with patient? Yes  SHORT TERM GOALS: = LTG based on PT POC length   LONG TERM GOALS: Target date: 02/25/23  Pt will be independent with final HEP for improved balance  Baseline: to be provided Goal  status: INITIAL  2.  Patient will complete at least 30s of condition 4 on M-CTSIB to demonstrate improved balance Baseline: to be provided Goal status: INITIAL  3.  FOTO goal Baseline: to be assessed Goal status: INITIAL   ASSESSMENT:  CLINICAL IMPRESSION: Patient is a 77 y.o. female who was seen today for physical therapy evaluation and treatment for dizziness. Patient reports episodic dizziness that is relieved by meclizine. She does have known hearing loss on L  side. Demonstrated greater LOB to the L during M-CTSIB. All other vestibular testing was WNL. She would benefit from skilled PT services to address the above mentioned deficits.    OBJECTIVE IMPAIRMENTS: decreased balance and dizziness.   ACTIVITY LIMITATIONS: locomotion level and caring for others  PARTICIPATION LIMITATIONS: driving, shopping, community activity, and yard work  PERSONAL FACTORS: Age, Fitness, Past/current experiences, Sex, Time since onset of injury/illness/exacerbation, and 3+ comorbidities: see above  are also affecting patient's functional outcome.   REHAB POTENTIAL: Good  CLINICAL DECISION MAKING: Stable/uncomplicated  EVALUATION COMPLEXITY: Low   PLAN:  PT FREQUENCY: 1x/week  PT DURATION: 4 weeks  PLANNED INTERVENTIONS: Therapeutic exercises, Therapeutic activity, Neuromuscular re-education, Balance training, Gait training, Patient/Family education, Self Care, Joint mobilization, Stair training, Vestibular training, Canalith repositioning, Visual/preceptual remediation/compensation, DME instructions, Aquatic Therapy, Manual therapy, and Re-evaluation  PLAN FOR NEXT SESSION: SOT?, FOTO, HEP, balance on compliant surfaces   Westley Foots, PT, DPT, CBIS 01/28/2023, 9:07 AM

## 2023-03-29 ENCOUNTER — Other Ambulatory Visit (HOSPITAL_BASED_OUTPATIENT_CLINIC_OR_DEPARTMENT_OTHER): Payer: Self-pay

## 2023-04-08 DIAGNOSIS — H6121 Impacted cerumen, right ear: Secondary | ICD-10-CM | POA: Diagnosis not present

## 2023-04-08 DIAGNOSIS — H9201 Otalgia, right ear: Secondary | ICD-10-CM | POA: Diagnosis not present

## 2023-04-08 DIAGNOSIS — H903 Sensorineural hearing loss, bilateral: Secondary | ICD-10-CM | POA: Diagnosis not present

## 2023-04-21 ENCOUNTER — Other Ambulatory Visit: Payer: Self-pay | Admitting: Cardiology

## 2023-04-21 ENCOUNTER — Telehealth: Payer: Self-pay

## 2023-04-21 DIAGNOSIS — I480 Paroxysmal atrial fibrillation: Secondary | ICD-10-CM

## 2023-04-21 NOTE — Telephone Encounter (Signed)
Spoke to patient advised she needs a cbc and bmet due to taking Xarelto.Stated she will have done 8/2 or 8/5.Orders placed.

## 2023-04-21 NOTE — Telephone Encounter (Signed)
Prescription refill request for Xarelto received.  Indication: PAF Last office visit: 09/27/22  P [place] MD Weight: 58.2kg Age: 77 Scr: 0.8 on 11/22/19 CrCl:  54.11  Based on above findings Xarelto 20mg  daily is the appropriate dose.  Pt is past due for labs.  Message sent to C Pugh to schedule lab work.  Refill approved x 1.

## 2023-04-22 DIAGNOSIS — I480 Paroxysmal atrial fibrillation: Secondary | ICD-10-CM | POA: Diagnosis not present

## 2023-05-16 ENCOUNTER — Ambulatory Visit: Payer: PPO | Admitting: Orthopaedic Surgery

## 2023-06-15 DIAGNOSIS — L905 Scar conditions and fibrosis of skin: Secondary | ICD-10-CM | POA: Diagnosis not present

## 2023-06-15 DIAGNOSIS — Z85820 Personal history of malignant melanoma of skin: Secondary | ICD-10-CM | POA: Diagnosis not present

## 2023-06-15 DIAGNOSIS — D1801 Hemangioma of skin and subcutaneous tissue: Secondary | ICD-10-CM | POA: Diagnosis not present

## 2023-06-15 DIAGNOSIS — D225 Melanocytic nevi of trunk: Secondary | ICD-10-CM | POA: Diagnosis not present

## 2023-06-15 DIAGNOSIS — L821 Other seborrheic keratosis: Secondary | ICD-10-CM | POA: Diagnosis not present

## 2023-07-15 DIAGNOSIS — M7750 Other enthesopathy of unspecified foot: Secondary | ICD-10-CM | POA: Diagnosis not present

## 2023-07-15 DIAGNOSIS — M79672 Pain in left foot: Secondary | ICD-10-CM | POA: Diagnosis not present

## 2023-07-15 DIAGNOSIS — M25472 Effusion, left ankle: Secondary | ICD-10-CM | POA: Diagnosis not present

## 2023-07-26 ENCOUNTER — Other Ambulatory Visit: Payer: Self-pay | Admitting: Cardiology

## 2023-07-26 NOTE — Telephone Encounter (Signed)
Prescription refill request for Xarelto received.  Indication: PAF Last office visit: 09/27/22  P Swaziland MD Weight: 58.2 Age: 77 Scr: 0.73 on 04/22/23  CrCl: 70  Based on above findings Xarelto 20mg  daily is the appropriate dose.  Refill approved.

## 2023-08-09 DIAGNOSIS — N390 Urinary tract infection, site not specified: Secondary | ICD-10-CM | POA: Diagnosis not present

## 2023-08-10 DIAGNOSIS — Z961 Presence of intraocular lens: Secondary | ICD-10-CM | POA: Diagnosis not present

## 2023-08-10 DIAGNOSIS — H26492 Other secondary cataract, left eye: Secondary | ICD-10-CM | POA: Diagnosis not present

## 2023-08-10 DIAGNOSIS — H524 Presbyopia: Secondary | ICD-10-CM | POA: Diagnosis not present

## 2023-08-19 ENCOUNTER — Encounter (HOSPITAL_COMMUNITY): Payer: Self-pay

## 2023-08-19 ENCOUNTER — Emergency Department (HOSPITAL_COMMUNITY)
Admission: EM | Admit: 2023-08-19 | Discharge: 2023-08-19 | Disposition: A | Discharge status: Home / Self Care | Payer: PPO | Attending: Emergency Medicine | Admitting: Emergency Medicine

## 2023-08-19 ENCOUNTER — Other Ambulatory Visit: Payer: Self-pay

## 2023-08-19 ENCOUNTER — Emergency Department (HOSPITAL_COMMUNITY): Payer: PPO

## 2023-08-19 DIAGNOSIS — Z7901 Long term (current) use of anticoagulants: Secondary | ICD-10-CM | POA: Insufficient documentation

## 2023-08-19 DIAGNOSIS — R310 Gross hematuria: Secondary | ICD-10-CM | POA: Insufficient documentation

## 2023-08-19 DIAGNOSIS — I480 Paroxysmal atrial fibrillation: Secondary | ICD-10-CM | POA: Insufficient documentation

## 2023-08-19 DIAGNOSIS — N132 Hydronephrosis with renal and ureteral calculous obstruction: Secondary | ICD-10-CM | POA: Insufficient documentation

## 2023-08-19 DIAGNOSIS — N390 Urinary tract infection, site not specified: Secondary | ICD-10-CM | POA: Insufficient documentation

## 2023-08-19 DIAGNOSIS — I959 Hypotension, unspecified: Secondary | ICD-10-CM | POA: Diagnosis not present

## 2023-08-19 DIAGNOSIS — Z79899 Other long term (current) drug therapy: Secondary | ICD-10-CM | POA: Diagnosis not present

## 2023-08-19 DIAGNOSIS — R109 Unspecified abdominal pain: Secondary | ICD-10-CM | POA: Diagnosis not present

## 2023-08-19 DIAGNOSIS — M549 Dorsalgia, unspecified: Secondary | ICD-10-CM | POA: Diagnosis not present

## 2023-08-19 DIAGNOSIS — Z91040 Latex allergy status: Secondary | ICD-10-CM | POA: Insufficient documentation

## 2023-08-19 DIAGNOSIS — N23 Unspecified renal colic: Secondary | ICD-10-CM | POA: Diagnosis not present

## 2023-08-19 DIAGNOSIS — I10 Essential (primary) hypertension: Secondary | ICD-10-CM | POA: Diagnosis not present

## 2023-08-19 DIAGNOSIS — M545 Low back pain, unspecified: Secondary | ICD-10-CM | POA: Diagnosis not present

## 2023-08-19 LAB — COMPREHENSIVE METABOLIC PANEL
ALT: 18 U/L (ref 0–44)
AST: 18 U/L (ref 15–41)
Albumin: 4 g/dL (ref 3.5–5.0)
Alkaline Phosphatase: 66 U/L (ref 38–126)
Anion gap: 8 (ref 5–15)
BUN: 24 mg/dL — ABNORMAL HIGH (ref 8–23)
CO2: 24 mmol/L (ref 22–32)
Calcium: 9.2 mg/dL (ref 8.9–10.3)
Chloride: 109 mmol/L (ref 98–111)
Creatinine, Ser: 0.78 mg/dL (ref 0.44–1.00)
GFR, Estimated: >60 mL/min (ref >60)
Glucose, Bld: 150 mg/dL — ABNORMAL HIGH (ref 70–99)
Potassium: 4 mmol/L (ref 3.5–5.1)
Sodium: 141 mmol/L (ref 135–145)
Total Bilirubin: 0.4 mg/dL (ref <1.2)
Total Protein: 7.1 g/dL (ref 6.5–8.1)

## 2023-08-19 LAB — CBC
HCT: 40.9 % (ref 36.0–46.0)
Hemoglobin: 13.5 g/dL (ref 12.0–15.0)
MCH: 30.2 pg (ref 26.0–34.0)
MCHC: 33 g/dL (ref 30.0–36.0)
MCV: 91.5 fL (ref 80.0–100.0)
Platelets: 244 10*3/uL (ref 150–400)
RBC: 4.47 MIL/uL (ref 3.87–5.11)
RDW: 14.2 % (ref 11.5–15.5)
WBC: 6.9 10*3/uL (ref 4.0–10.5)
nRBC: 0 % (ref 0.0–0.2)

## 2023-08-19 MED ORDER — LACTATED RINGERS IV BOLUS
500.0000 mL | Freq: Once | INTRAVENOUS | Status: AC
  Administered 2023-08-19: 500 mL via INTRAVENOUS

## 2023-08-19 MED ORDER — OXYCODONE-ACETAMINOPHEN 5-325 MG PO TABS: 1.0000 | ORAL_TABLET | Freq: Four times a day (QID) | ORAL | 0 refills | Status: DC | PRN

## 2023-08-19 MED ORDER — KETOROLAC TROMETHAMINE 15 MG/ML IJ SOLN
15.0000 mg | Freq: Once | INTRAMUSCULAR | Status: AC
  Administered 2023-08-19: 15 mg via INTRAVENOUS
  Filled 2023-08-19: qty 1

## 2023-08-19 MED ORDER — FENTANYL CITRATE PF 50 MCG/ML IJ SOSY
12.5000 ug | PREFILLED_SYRINGE | Freq: Once | INTRAMUSCULAR | Status: AC
  Administered 2023-08-19: 12.5 ug via INTRAVENOUS
  Filled 2023-08-19: qty 1

## 2023-08-19 MED ORDER — ONDANSETRON HCL 4 MG/2ML IJ SOLN
4.0000 mg | Freq: Once | INTRAMUSCULAR | Status: AC
  Administered 2023-08-19: 4 mg via INTRAVENOUS
  Filled 2023-08-19: qty 2

## 2023-08-19 MED ORDER — ONDANSETRON 8 MG PO TBDP: 8.0000 mg | ORAL_TABLET | Freq: Three times a day (TID) | ORAL | 0 refills | Status: DC | PRN

## 2023-08-19 MED ORDER — TAMSULOSIN HCL 0.4 MG PO CAPS: 0.4000 mg | ORAL_CAPSULE | Freq: Every day | ORAL | 0 refills | Status: DC

## 2023-08-19 NOTE — ED Triage Notes (Signed)
Pt BIB EMS from Home due to lower back pain and left flank pain since this morning. Pt reports blood in urine since Wednesday night. Pt vomited two times the morning; clear liquid.  BP105/64 HR 70 SpO2 95 CBG 175

## 2023-08-19 NOTE — Discharge Instructions (Signed)
Please restart your rivaroxaban. You may take Percocet for pain and Zofran for nausea Please call urology today for appointment next week Return if you are having new or worsening symptoms

## 2023-08-19 NOTE — ED Provider Notes (Signed)
[place] EMERGENCY DEPARTMENT AT [facility] Provider Note   CSN: [id number] Arrival date & time: 08/19/23  7829     History  Chief Complaint  Patient presents with   Back Pain   Hematuria   Flank Pain    Alyssa Martin is a 77 y.o. female.  HPI 77 year old female presents today complaining of hematuria, flank pain, nausea and vomiting.  She states that she began having hematuria with urination several days ago.  She began having some left flank pain yesterday evening and has vomited 3 times.  Pain is currently 9 out of 10.  Is constant in nature.  It is sharp.  She cannot have anything that definitively brings it on.  She has not had the symptoms in the past.  She is on blood thinners for paroxysmal atrial fibrillation.  She denies any previous hematuria, vaginal bleeding, rectal bleeding.  She was seen by her primary care physician approximately 2 weeks ago for an influenza-like illness.  She states she was also treated with antibiotics twice.  The second 1 was when she thought she had a urinary tract infection.  She completed all antibiotics.  She was told she did not have a UTI.  She denies fever, chills, chest pain, dyspnea, diarrhea     Home Medications Prior to Admission medications   Medication Sig Start Date End Date Taking? Authorizing Provider  ondansetron (ZOFRAN-ODT) 8 MG disintegrating tablet Take 1 tablet (8 mg total) by mouth every 8 (eight) hours as needed for nausea or vomiting. 08/19/23  Yes Margarita Grizzle, MD  oxyCODONE-acetaminophen (PERCOCET/ROXICET) 5-325 MG tablet Take 1 tablet by mouth every 6 (six) hours as needed for severe pain (pain score 7-10). 08/19/23  Yes Margarita Grizzle, MD  tamsulosin (FLOMAX) 0.4 MG CAPS capsule Take 1 capsule (0.4 mg total) by mouth daily. 08/19/23  Yes Margarita Grizzle, MD  Calcium Citrate (CITRACAL PO) Take 630 mg by mouth 2 (two) times daily.     [provider]  Coenzyme Q10 (COQ10) 200 MG CAPS Take 200 mg  daily 12/09/21   [place], Peter M, MD  diltiazem (DILT-XR) 180 MG 24 hr capsule Take 1 capsule (180 mg total) by mouth daily. 08/27/21   [place], Peter M, MD  ergocalciferol (VITAMIN D2) 50000 UNITS capsule Take 50,000 Units by mouth every 14 (fourteen) days.    [provider]  Estradiol 10 MCG TABS vaginal tablet Yuvafem 10 mcg vaginal tablet    [provider]  famotidine (PEPCID) 20 MG tablet Take 20 mg by mouth as needed for heartburn or indigestion.    [provider]  loratadine (CLARITIN) 10 MG tablet Take 10 mg by mouth daily as needed for allergies.     [provider]  mometasone (NASONEX) 50 MCG/ACT nasal spray Place 2 sprays into the nose daily as needed (allergies).     [provider]  Multiple Vitamin (MULTIVITAMIN) tablet Take 1 tablet by mouth daily.    [provider]  rivaroxaban (XARELTO) 20 MG TABS tablet TAKE 1 TABLET BY MOUTH DAILY WITH SUPPER 07/26/23   [place], Peter M, MD  simvastatin (ZOCOR) 20 MG tablet Take 20 mg by mouth every evening.    [provider]  vitamin C (ASCORBIC ACID) 500 MG tablet Take 500 mg by mouth daily.    [provider]  zoledronic acid (RECLAST) 5 MG/100ML SOLN injection Inject 5 mg into the vein once. Yearly    [provider]  Allergies    Latex, Penicillins, and Ativan [lorazepam]    Review of Systems   Review of Systems  Physical Exam Updated Vital Signs BP 104/64   Pulse 65   Temp 97.6 F (36.4 C) (Oral)   Resp 13   Ht 1.499 m (4\' 11" )   Wt 54.4 kg   SpO2 98%   BMI 24.24 kg/m  Physical Exam Vitals and nursing note reviewed. Exam conducted with a chaperone present.  HENT:     Head: Normocephalic.     Right Ear: External ear normal.     Left Ear: External ear normal.     Nose: Nose normal.  Eyes:     Pupils: Pupils are equal, round, and reactive to light.  Cardiovascular:     Rate and Rhythm: Normal rate and regular rhythm.     Pulses:  Normal pulses.  Pulmonary:     Effort: Pulmonary effort is normal.     Breath sounds: Normal breath sounds.  Abdominal:     General: Abdomen is flat. Bowel sounds are normal.     Palpations: Abdomen is soft.  Genitourinary:    Vagina: No vaginal discharge.  Musculoskeletal:        General: Normal range of motion.     Cervical back: Normal range of motion.  Skin:    General: Skin is warm and dry.     Capillary Refill: Capillary refill takes less than 2 seconds.  Neurological:     General: No focal deficit present.     Mental Status: She is alert.  Psychiatric:        Mood and Affect: Mood normal.     ED Results / Procedures / Treatments   Labs (all labs ordered are listed, but only abnormal results are displayed) Labs Reviewed  COMPREHENSIVE METABOLIC PANEL - Abnormal; Notable for the following components:      Result Value   Glucose, Bld 150 (*)    BUN 24 (*)    All other components within normal limits  CBC  URINALYSIS, ROUTINE W REFLEX MICROSCOPIC    EKG EKG Interpretation Date/Time:  Friday August 19 2023 08:40:02 EST Ventricular Rate:  65 PR Interval:  166 QRS Duration:  103 QT Interval:  420 QTC Calculation: 437 R Axis:   42  Text Interpretation: Sinus rhythm Multiple premature complexes, vent & supraven Low voltage, precordial leads Confirmed by Margarita Grizzle [phone]) on 08/19/2023 9:36:47 AM  Radiology CT ABDOMEN PELVIS WO CONTRAST  Result Date: 08/19/2023 CLINICAL DATA:  Abdominal/flank pain with stone suspected EXAM: CT ABDOMEN AND PELVIS WITHOUT CONTRAST TECHNIQUE: Multidetector CT imaging of the abdomen and pelvis was performed following the standard protocol without IV contrast. RADIATION DOSE REDUCTION: This exam was performed according to the departmental dose-optimization program which includes automated exposure control, adjustment of the mA and/or kV according to patient size and/or use of iterative reconstruction technique. COMPARISON:  None  Available. FINDINGS: Lower chest: Atelectasis at the lung bases. Small fatty Bochdalek's hernia on the left. Hepatobiliary: No focal liver abnormality.No evidence of biliary obstruction or stone. Pancreas: Unremarkable. Spleen: Unremarkable. Adrenals/Urinary Tract: Negative adrenals. Left hydroureteronephrosis and mild perinephric stranding secondary to a ureteral stone in the upper ureter measuring 5 mm, located at the level of L3-4. No additional urolithiasis. Unremarkable bladder. Stomach/Bowel:  No obstruction. No visible bowel inflammation. Vascular/Lymphatic: No acute vascular abnormality. Multifocal atheromatous calcification of the aorta. No mass or adenopathy. Reproductive:No pathologic findings. Other: No ascites or pneumoperitoneum. Hernia in the right lower quadrant  wall containing nonobstructed small bowel, relationship to the deep inguinal ring is uncertain. Musculoskeletal: No acute abnormalities. IMPRESSION: 1. Left hydronephrosis from a 5 mm upper ureteral stone. 2. Bowel containing hernia in the right lower quadrant wall. Electronically Signed   By: Tiburcio Pea M.D.   On: 08/19/2023 08:02    Procedures Procedures    Medications Ordered in ED Medications  lactated ringers bolus 500 mL (0 mLs Intravenous Stopped 08/19/23 0851)  ondansetron (ZOFRAN) injection 4 mg (4 mg Intravenous Given 08/19/23 0848)  fentaNYL (SUBLIMAZE) injection 12.5 mcg (12.5 mcg Intravenous Given 08/19/23 0847)  ketorolac (TORADOL) 15 MG/ML injection 15 mg (15 mg Intravenous Given 08/19/23 0946)    ED Course/ Medical Decision Making/ A&P Clinical Course as of 08/19/23 0950  Fri Aug 19, 2023  3244 CT pelvis reviewed and interpreted significant for stone in left proximal ureter, radiologist notes left hydronephrosis from 5 mm upper ureteral stone [DR]  0936 CBC reviewed interpreted and within normal limits Complete metabolic panel reviewed interpreted send for mild hyperglycemia at 150 [DR]    Clinical  Course User Index [DR] Margarita Grizzle, MD                                 Medical Decision Making Amount and/or Complexity of Data Reviewed Labs: ordered. Radiology: ordered.  Risk Prescription drug management.   Patient presents today with hematuria left flank pain and nausea and vomiting with mild abdominal tenderness Differential diagnosis includes but is not limited to renal colic, diverticulitis, renal and other intra-abdominal hemorrhage, small bowel obstruction, UTI and other pelvic infections Patient was evaluated here with labs and CT.  CT is significant for 5 mm proximal left ureteral stone likely representing patient's flank pain and hematuria. Patient given pain medicine here Discussed need for follow-up and return precautions.  She will restart her home rivaroxaban she will call urology for follow-up on Monday       Final Clinical Impression(s) / ED Diagnoses Final diagnoses:  Renal colic on left side  Gross hematuria  Chronic anticoagulation    Rx / DC Orders ED Discharge Orders          Ordered    oxyCODONE-acetaminophen (PERCOCET/ROXICET) 5-325 MG tablet  Every 6 hours PRN        08/19/23 0949    ondansetron (ZOFRAN-ODT) 8 MG disintegrating tablet  Every 8 hours PRN        08/19/23 0949    tamsulosin (FLOMAX) 0.4 MG CAPS capsule  Daily        08/19/23 0949              Margarita Grizzle, MD 08/19/23 [phone]

## 2023-08-22 ENCOUNTER — Telehealth: Payer: Self-pay | Admitting: *Deleted

## 2023-08-22 ENCOUNTER — Other Ambulatory Visit: Payer: Self-pay | Admitting: Urology

## 2023-08-22 DIAGNOSIS — N132 Hydronephrosis with renal and ureteral calculous obstruction: Secondary | ICD-10-CM | POA: Diagnosis not present

## 2023-08-22 NOTE — Progress Notes (Signed)
Anesthesia Review:  PCP: Cardiologist : DR Peter Swaziland LOV 09/27/22  Video Visit with Cardiology on ?  Chest x-ray : EKG : 08/19/23  Echo : Stress test: Cardiac Cath :  Activity level:  Sleep Study/ CPAP : Fasting Blood Sugar :      / Checks Blood Sugar -- times a day:   Blood Thinner/ Instructions /Last Dose: ASA / Instructions/ Last Dose :    Xarelto    08/19/23- cbc and CMP on 08/19/23

## 2023-08-22 NOTE — Telephone Encounter (Signed)
Primary Cardiologist:Peter Swaziland, MD   Preoperative team, please contact this patient and set up a phone call appointment for further preoperative risk assessment. Please obtain consent and complete medication review. Thank you for your help.   I confirm that guidance regarding antiplatelet and oral anticoagulation therapy has been completed and, if necessary, noted below.  Per office protocol, patient can hold Xarelto for 3 days prior to procedure.    I also confirmed the patient resides in the state of [place]. As per [facility] Medical Board telemedicine laws, the patient must reside in the state in which the provider is licensed.   Levi Aland, NP-C 08/22/2023, 2:59 PM [address] Office [phone] Fax [phone]

## 2023-08-22 NOTE — Telephone Encounter (Signed)
Patient is returning call.  °

## 2023-08-22 NOTE — Patient Instructions (Signed)
SURGICAL WAITING ROOM VISITATION  Patients having surgery or a procedure may have no more than 2 support people in the waiting area - these visitors may rotate.    Children under the age of 65 must have an adult with them who is not the patient.  Due to an increase in RSV and influenza rates and associated hospitalizations, children ages 3 and under may not visit patients in [facility] hospitals.  If the patient needs to stay at the hospital during part of their recovery, the visitor guidelines for inpatient rooms apply. Pre-op nurse will coordinate an appropriate time for 1 support person to accompany patient in pre-op.  This support person may not rotate.    Please refer to the [facility] website for the visitor guidelines for Inpatients (after your surgery is over and you are in a regular room).       Your procedure is scheduled on:  08/30/2023    Report to [facility] Main Entrance    Report to admitting at    1015AM   Call this number if you have problems the morning of surgery [phone]   Do not eat food  oor drink liquids :After Midnight.      .          If you have questions, please contact your surgeon's office.      Oral Hygiene is also important to reduce your risk of infection.                                    Remember - BRUSH YOUR TEETH THE MORNING OF SURGERY WITH YOUR REGULAR TOOTHPASTE  DENTURES WILL BE REMOVED PRIOR TO SURGERY PLEASE DO NOT APPLY "Poly grip" OR ADHESIVES!!!   Do NOT smoke after Midnight   Stop all vitamins and herbal supplements 7 days before surgery.   Take these medicines the morning of surgery with A SIP OF WATER:  diltiazem, flomax   DO NOT TAKE ANY ORAL DIABETIC MEDICATIONS DAY OF YOUR SURGERY  Bring CPAP mask and tubing day of surgery.                              You may not have any metal on your body including hair pins, jewelry, and body piercing             Do not wear make-up, lotions, powders,  perfumes/cologne, or deodorant  Do not wear nail polish including gel and S&S, artificial/acrylic nails, or any other type of covering on natural nails including finger and toenails. If you have artificial nails, gel coating, etc. that needs to be removed by a nail salon please have this removed prior to surgery or surgery may need to be canceled/ delayed if the surgeon/ anesthesia feels like they are unable to be safely monitored.   Do not shave  48 hours prior to surgery.               Men may shave face and neck.   Do not bring valuables to the hospital. [facility] IS NOT             RESPONSIBLE   FOR VALUABLES.   Contacts, glasses, dentures or bridgework may not be worn into surgery.   Bring small overnight bag day of surgery.   DO NOT BRING YOUR HOME MEDICATIONS TO THE HOSPITAL. PHARMACY  WILL DISPENSE MEDICATIONS LISTED ON YOUR MEDICATION LIST TO YOU DURING YOUR ADMISSION IN THE HOSPITAL!    Patients discharged on the day of surgery will not be allowed to drive home.  Someone NEEDS to stay with you for the first 24 hours after anesthesia.   Special Instructions: Bring a copy of your healthcare power of attorney and living will documents the day of surgery if you haven't scanned them before.              Please read over the following fact sheets you were given: IF YOU HAVE QUESTIONS ABOUT YOUR PRE-OP INSTRUCTIONS PLEASE CALL [phone]   If you received a COVID test during your pre-op visit  it is requested that you wear a mask when out in public, stay away from anyone that may not be feeling well and notify your surgeon if you develop symptoms. If you test positive for Covid or have been in contact with anyone that has tested positive in the last 10 days please notify you surgeon.    [place] - Preparing for Surgery Before surgery, you can play an important role.  Because skin is not sterile, your skin needs to be as free of germs as possible.  You can reduce the number of  germs on your skin by washing with CHG (chlorahexidine gluconate) soap before surgery.  CHG is an antiseptic cleaner which kills germs and bonds with the skin to continue killing germs even after washing. Please DO NOT use if you have an allergy to CHG or antibacterial soaps.  If your skin becomes reddened/irritated stop using the CHG and inform your nurse when you arrive at Short Stay. Do not shave (including legs and underarms) for at least 48 hours prior to the first CHG shower.  You may shave your face/neck. Please follow these instructions carefully:  1.  Shower with CHG Soap the night before surgery and the  morning of Surgery.  2.  If you choose to wash your hair, wash your hair first as usual with your  normal  shampoo.  3.  After you shampoo, rinse your hair and body thoroughly to remove the  shampoo.                           4.  Use CHG as you would any other liquid soap.  You can apply chg directly  to the skin and wash                       Gently with a scrungie or clean washcloth.  5.  Apply the CHG Soap to your body ONLY FROM THE NECK DOWN.   Do not use on face/ open                           Wound or open sores. Avoid contact with eyes, ears mouth and genitals (private parts).                       Wash face,  Genitals (private parts) with your normal soap.             6.  Wash thoroughly, paying special attention to the area where your surgery  will be performed.  7.  Thoroughly rinse your body with warm water from the neck down.  8.  DO NOT shower/wash with your normal soap after using  and rinsing off  the CHG Soap.                9.  Pat yourself dry with a clean towel.            10.  Wear clean pajamas.            11.  Place clean sheets on your bed the night of your first shower and do not  sleep with pets. Day of Surgery : Do not apply any lotions/deodorants the morning of surgery.  Please wear clean clothes to the hospital/surgery center.  FAILURE TO FOLLOW THESE  INSTRUCTIONS MAY RESULT IN THE CANCELLATION OF YOUR SURGERY PATIENT SIGNATURE_________________________________  NURSE SIGNATURE__________________________________  ________________________________________________________________________

## 2023-08-22 NOTE — Telephone Encounter (Signed)
   Pre-operative Risk Assessment    Patient Name: Alyssa Martin  DOB: [birth year] MRN: [id number]      Request for Surgical Clearance    Procedure:   CYSTO; LEFT URETEROSCOPY, LAST LITHI; STENT  Date of Surgery:  Clearance 08/30/23                                 Surgeon:  DR Lafonda Mosses Surgeon's Group or Practice Name:  [facility] Phone number:  [phone] Fax number:  [phone]   Type of Clearance Requested:   - Medical  - Pharmacy:  Hold Rivaroxaban (Xarelto) X'S 3 DAYS   Type of Anesthesia:  Not Indicated   Additional requests/questions:    [name]   08/22/2023, 1:41 PM

## 2023-08-22 NOTE — Telephone Encounter (Signed)
Pt called back and left message to call back. I called the pt back and was able to schedule her a tele preop appt per Eligha Bridegroom, NP this week, Thurs or Friday. Pt has been scheduled 08/25/23. Med rec and consent are done.      Patient Consent for Virtual Visit        Alyssa Martin has provided verbal consent on 08/22/2023 for a virtual visit (video or telephone).   CONSENT FOR VIRTUAL VISIT FOR:  Alyssa Martin  By participating in this virtual visit I agree to the following:  I hereby voluntarily request, consent and authorize [facility] and its employed or contracted physicians, physician assistants, nurse practitioners or other licensed health care professionals (the Practitioner), to provide me with telemedicine health care services (the "Services") as deemed necessary by the treating Practitioner. I acknowledge and consent to receive the Services by the Practitioner via telemedicine. I understand that the telemedicine visit will involve communicating with the Practitioner through live audiovisual communication technology and the disclosure of certain medical information by electronic transmission. I acknowledge that I have been given the opportunity to request an in-person assessment or other available alternative prior to the telemedicine visit and am voluntarily participating in the telemedicine visit.  I understand that I have the right to withhold or withdraw my consent to the use of telemedicine in the course of my care at any time, without affecting my right to future care or treatment, and that the Practitioner or I may terminate the telemedicine visit at any time. I understand that I have the right to inspect all information obtained and/or recorded in the course of the telemedicine visit and may receive copies of available information for a reasonable fee.  I understand that some of the potential risks of receiving the Services via telemedicine include:  Delay or  interruption in medical evaluation due to technological equipment failure or disruption; Information transmitted may not be sufficient (e.g. poor resolution of images) to allow for appropriate medical decision making by the Practitioner; and/or  In rare instances, security protocols could fail, causing a breach of personal health information.  Furthermore, I acknowledge that it is my responsibility to provide information about my medical history, conditions and care that is complete and accurate to the best of my ability. I acknowledge that Practitioner's advice, recommendations, and/or decision may be based on factors not within their control, such as incomplete or inaccurate data provided by me or distortions of diagnostic images or specimens that may result from electronic transmissions. I understand that the practice of medicine is not an exact science and that Practitioner makes no warranties or guarantees regarding treatment outcomes. I acknowledge that a copy of this consent can be made available to me via my patient portal [facility] MyChart), or I can request a printed copy by calling the office of [facility].    I understand that my insurance will be billed for this visit.   I have read or had this consent read to me. I understand the contents of this consent, which adequately explains the benefits and risks of the Services being provided via telemedicine.  I have been provided ample opportunity to ask questions regarding this consent and the Services and have had my questions answered to my satisfaction. I give my informed consent for the services to be provided through the use of telemedicine in my medical care

## 2023-08-22 NOTE — Telephone Encounter (Signed)
Patient with diagnosis of afib on Xarelto for anticoagulation.    Procedure: CYSTO; LEFT URETEROSCOPY, LAST LITHI; STENT  Date of procedure: 08/30/23   CHA2DS2-VASc Score = 4   This indicates a 4.8% annual risk of stroke. The patient's score is based upon: CHF History: 0 HTN History: 1 Diabetes History: 0 Stroke History: 0 Vascular Disease History: 0 Age Score: 2 Gender Score: 1      CrCl 51 Platelet count 244  Per office protocol, patient can hold Xarelto for 3 days prior to procedure.    **This guidance is not considered finalized until pre-operative APP has relayed final recommendations.**

## 2023-08-22 NOTE — Telephone Encounter (Signed)
Pt called back and left message to call back. I called the pt back and was able to schedule her a tele preop appt per Eligha Bridegroom, NP this week, Thurs or Friday. Pt has been scheduled 08/25/23. Med rec and consent are done.

## 2023-08-22 NOTE — Telephone Encounter (Signed)
Pharmacy please advise on holding Xarelto prior to cysto, left ureteroscopy scheduled for 08/30/2023. Thank you.

## 2023-08-22 NOTE — Telephone Encounter (Signed)
Left message to call back to set up tele appt. Preop APP today Eligha Bridegroom, NP advised to schedule pt either this Thursday or Friday as we will need time for pt to hold her blood thinner.

## 2023-08-23 ENCOUNTER — Other Ambulatory Visit: Payer: Self-pay

## 2023-08-23 ENCOUNTER — Encounter (HOSPITAL_COMMUNITY): Payer: Self-pay

## 2023-08-23 ENCOUNTER — Encounter (HOSPITAL_COMMUNITY)
Admission: RE | Admit: 2023-08-23 | Discharge: 2023-08-23 | Disposition: A | Discharge status: Home / Self Care | Payer: PPO | Source: Ambulatory Visit | Attending: Urology | Admitting: Urology

## 2023-08-23 HISTORY — DX: Unspecified atrial fibrillation: I48.91

## 2023-08-23 HISTORY — DX: Tinnitus, unspecified ear: H93.19

## 2023-08-23 HISTORY — DX: Malignant (primary) neoplasm, unspecified: C80.1

## 2023-08-24 NOTE — Progress Notes (Unsigned)
Virtual Visit via Telephone Note   Because of Alyssa Martin's co-morbid illnesses, she is at least at moderate risk for complications without adequate follow up.  This format is felt to be most appropriate for this patient at this time.  The patient did not have access to video technology/had technical difficulties with video requiring transitioning to audio format only (telephone).  All issues noted in this document were discussed and addressed.  No physical exam could be performed with this format.  Please refer to the patient's chart for her consent to telehealth for [facility].  Evaluation Performed:  Preoperative cardiovascular risk assessment _____________   Date:  08/24/2023   Patient ID:  Alyssa Martin, DOB 1945/12/23, MRN [id number] Patient Location:  Home Provider location:   Office  Primary Care Provider:  Cleatis Polka., MD Primary Cardiologist:  Peter Swaziland, MD  Chief Complaint / Patient Profile   77 y.o. y/o female with a h/o paroxysmal atrial fibrillation who is pending  CYSTO; LEFT URETEROSCOPY, LAST LITHI; STENT  and presents today for telephonic preoperative cardiovascular risk assessment.  History of Present Illness    Alyssa Martin is a 77 y.o. female who presents via audio/video conferencing for a telehealth visit today.  Pt was last seen in cardiology clinic on 09/27/2022 by Dr. Swaziland.  At that time Alyssa Martin was doing well .  The patient is now pending procedure as outlined above. Since her last visit, she remains stable from a cardiac standpoint.  Today she denies chest pain, shortness of breath, lower extremity edema, fatigue, palpitations, melena, hemoptysis, diaphoresis, weakness, presyncope, syncope, orthopnea, and PND.   Past Medical History    Past Medical History:  Diagnosis Date   A-fib (HCC)    Arthritis    Cancer (HCC)    melanoma on arm   Closed fracture of left proximal humerus    Closed right ankle fracture     Elevated BP    GERD (gastroesophageal reflux disease)    Hyperlipidemia    Hypertension    Impaired fasting glucose    Osteopenia    Osteoporosis    Paroxysmal atrial fibrillation (HCC)    Tinnitus    Vitamin D deficiency    Wears glasses    Past Surgical History:  Procedure Laterality Date   COLONOSCOPY  05/10/2011   DIAGNOSTIC MAMMOGRAM  10 /09/2010   ORIF HUMERUS FRACTURE Left 08/18/2017   Procedure: OPEN REDUCTION INTERNAL FIXATION (ORIF) LEFT PROXIMAL HUMERUS FRACTURE;  Surgeon: Cammy Copa, MD;  Location: [facility] OR;  Service: Orthopedics;  Laterality: Left;   TIBIA FRACTURE SURGERY  04/2006   Left--From falling down the stairs   TIBIA FRACTURE SURGERY     In her 40's    Allergies  Allergies  Allergen Reactions   Latex Hives   Penicillins     UNSPECIFIED REACTION    Ativan [Lorazepam] Nausea Only    Home Medications    Prior to Admission medications   Medication Sig Start Date End Date Taking? Authorizing Provider  Calcium Citrate (CITRACAL PO) Take 630 mg by mouth 2 (two) times daily.  Patient not taking: Reported on 08/22/2023    [provider]  cetirizine (ZYRTEC) 10 MG tablet Take 10 mg by mouth daily as needed for allergies. Patient not taking: Reported on 08/22/2023    [provider]  Coenzyme Q10 (COQ10) 200 MG CAPS Take 200 mg daily Patient not taking: Reported on 08/22/2023 12/09/21   [place],  Demetria Pore, MD  diltiazem (DILT-XR) 180 MG 24 hr capsule Take 1 capsule (180 mg total) by mouth daily. 08/27/21   [place]iland, Peter M, MD  ergocalciferol (VITAMIN D2) 50000 UNITS capsule Take 50,000 Units by mouth every 14 (fourteen) days.    [provider]  Estradiol 10 MCG TABS vaginal tablet Yuvafem 10 mcg vaginal tablet Patient not taking: Reported on 08/22/2023    [provider]  famotidine (PEPCID) 20 MG tablet Take 20 mg by mouth as needed for heartburn or indigestion. Patient not taking: Reported on 08/22/2023    [provider]  fluticasone (FLONASE) 50 MCG/ACT nasal spray Place into both nostrils daily as needed for allergies or rhinitis.    [provider]  loratadine (CLARITIN) 10 MG tablet Take 10 mg by mouth daily as needed for allergies.  Patient not taking: Reported on 08/22/2023    [provider]  methocarbamol (ROBAXIN) 750 MG tablet Take 750 mg by mouth at bedtime as needed. Patient not taking: Reported on 08/22/2023 08/09/23   [provider]  mometasone (NASONEX) 50 MCG/ACT nasal spray Place 2 sprays into the nose daily as needed (allergies).  Patient not taking: Reported on 08/22/2023    [provider]  Multiple Vitamin (MULTIVITAMIN) tablet Take 1 tablet by mouth daily. Patient not taking: Reported on 08/22/2023    [provider]  ondansetron (ZOFRAN-ODT) 8 MG disintegrating tablet Take 1 tablet (8 mg total) by mouth every 8 (eight) hours as needed for nausea or vomiting. 08/19/23   Margarita Grizzle, MD  oxyCODONE-acetaminophen (PERCOCET/ROXICET) 5-325 MG tablet Take 1 tablet by mouth every 6 (six) hours as needed for severe pain (pain score 7-10). 08/19/23   Margarita Grizzle, MD  rivaroxaban (XARELTO) 20 MG TABS tablet TAKE 1 TABLET BY MOUTH DAILY WITH SUPPER 07/26/23   [place]il[place], Peter M, MD  simvastatin (ZOCOR) 20 MG tablet Take 20 mg by mouth every evening.    [provider]  tamsulosin (FLOMAX) 0.4 MG CAPS capsule Take 1 capsule (0.4 mg total) by mouth daily. 08/19/23   Margarita Grizzle, MD  vitamin C (ASCORBIC ACID) 500 MG tablet Take 500 mg by mouth daily. Patient not taking: Reported on 08/22/2023    [provider]  zoledronic acid (RECLAST) 5 MG/100ML SOLN injection Inject 5 mg into the vein once. Yearly Patient not taking: Reported on 08/22/2023    [provider]    Physical Exam    Vital Signs:  [name] does not have vital signs available for review today.  Given telephonic nature of communication, physical  exam is limited. AAOx3. NAD. Normal affect.  Speech and respirations are unlabored.  Accessory Clinical Findings    None  Assessment & Plan    1.  Preoperative Cardiovascular Risk Assessment: Cystoscopy, left ureteroscopy, LAST LITHI; stent, Surgeon:  DR Lafonda Mosses Surgeon's Group or Practice Name:  [facility] Phone number:  [phone] Fax number:  [phone]      Primary Cardiologist: Peter Swaziland, MD  Chart reviewed as part of pre-operative protocol coverage. Given past medical history and time since last visit, based on ACC/AHA guidelines, Alyssa Martin would be at acceptable risk for the planned procedure without further cardiovascular testing.   Her RCRI is low risk, 0.9% risk of major cardiac event.  She is able to complete greater than 4 METS of physical activity.  Patient was advised that if she develops new symptoms prior to surgery to contact our office to arrange a follow-up appointment.  She verbalized understanding.  Her Xarelto may be held for 3 days prior to her procedure.  Please resume as soon as hemostasis is achieved.  I will route this recommendation to the requesting party via Epic fax function and remove from pre-op pool.       Time:   Today, I have spent  minutes with the patient with telehealth technology discussing medical history, symptoms, and management plan.     Ronney Asters, NP  08/24/2023, 8:41 AM    Prior to patient's phone evaluation I spent greater than 10 minutes reviewing their past medical history and cardiac medications.

## 2023-08-25 ENCOUNTER — Ambulatory Visit: Payer: PPO | Attending: Cardiovascular Disease

## 2023-08-25 DIAGNOSIS — Z01810 Encounter for preprocedural cardiovascular examination: Secondary | ICD-10-CM

## 2023-08-26 NOTE — Progress Notes (Signed)
Anesthesia Chart Review   Case: [id number] Date/Time: 08/30/23 1215   Procedure: CYSTOSCOPY, LEFT URETEROSCOPY, HOLMIUM LASER LITHOTRIPSY, AND LEFT URETERAL STENT PLACEMENT (Left) - 60 MINUTES   Anesthesia type: General   Pre-op diagnosis: LEFT URETERAL CALCULUS   Location: [facility] PROC[facility] / [facility]   Surgeons: Despina Arias, MD       DISCUSSION:77 y.o. never smoker with h/o HTN, atrial fibrillation, left ureteral calculus scheduled for above procedure 08/30/2023 with Dr. Traci Sermon.   Per cardiology preoperative evaluation 08/25/2023, "Chart reviewed as part of pre-operative protocol coverage. Given past medical history and time since last visit, based on ACC/AHA guidelines, Alyssa Martin would be at acceptable risk for the planned procedure without further cardiovascular testing.    Her RCRI is low risk, 0.9% risk of major cardiac event.  She is able to complete greater than 4 METS of physical activity.   Patient was advised that if she develops new symptoms prior to surgery to contact our office to arrange a follow-up appointment.  She verbalized understanding.   Her Xarelto may be held for 3 days prior to her procedure.  Please resume as soon as hemostasis is achieved."  VS: There were no vitals taken for this visit.  PROVIDERS: Cleatis Polka., MD is PCP    LABS: Labs reviewed: Acceptable for surgery. (all labs ordered are listed, but only abnormal results are displayed)  Labs Reviewed - No data to display   IMAGES:   EKG:   CV: Echo 09/14/2012 - Left ventricle: The cavity size was normal. Wall thickness    was normal. Systolic function was normal. The estimated    ejection fraction was in the range of 55% to 65%. Wall    motion was normal; there were no regional wall motion    abnormalities. Doppler parameters are consistent with    abnormal left ventricular relaxation (grade 1 diastolic    dysfunction).  - Aortic valve: Trivial regurgitation.  -  Mitral valve: Mild regurgitation.  - Left atrium: The atrium was mildly dilated.  - Atrial septum: No defect or patent foramen ovale was    identified.  - Pericardium, extracardiac: A trivial pericardial effusion    was identified.  Past Medical History:  Diagnosis Date   A-fib (HCC)    Arthritis    Cancer (HCC)    melanoma on arm   Closed fracture of left proximal humerus    Closed right ankle fracture    Elevated BP    GERD (gastroesophageal reflux disease)    Hyperlipidemia    Hypertension    Impaired fasting glucose    Osteopenia    Osteoporosis    Paroxysmal atrial fibrillation (HCC)    Tinnitus    Vitamin D deficiency    Wears glasses     Past Surgical History:  Procedure Laterality Date   COLONOSCOPY  05/10/2011   DIAGNOSTIC MAMMOGRAM  10 /09/2010   ORIF HUMERUS FRACTURE Left 08/18/2017   Procedure: OPEN REDUCTION INTERNAL FIXATION (ORIF) LEFT PROXIMAL HUMERUS FRACTURE;  Surgeon: Cammy Copa, MD;  Location: [facility] OR;  Service: Orthopedics;  Laterality: Left;   TIBIA FRACTURE SURGERY  04/2006   Left--From falling down the stairs   TIBIA FRACTURE SURGERY     In her 40's    MEDICATIONS:  Calcium Citrate (CITRACAL PO)   cetirizine (ZYRTEC) 10 MG tablet   Coenzyme Q10 (COQ10) 200 MG CAPS   diltiazem (DILT-XR) 180 MG 24 hr capsule   ergocalciferol (VITAMIN  D2) 50000 UNITS capsule   Estradiol 10 MCG TABS vaginal tablet   famotidine (PEPCID) 20 MG tablet   fluticasone (FLONASE) 50 MCG/ACT nasal spray   loratadine (CLARITIN) 10 MG tablet   methocarbamol (ROBAXIN) 750 MG tablet   mometasone (NASONEX) 50 MCG/ACT nasal spray   Multiple Vitamin (MULTIVITAMIN) tablet   ondansetron (ZOFRAN-ODT) 8 MG disintegrating tablet   oxyCODONE (OXY IR/ROXICODONE) 5 MG immediate release tablet   oxyCODONE-acetaminophen (PERCOCET/ROXICET) 5-325 MG tablet   rivaroxaban (XARELTO) 20 MG TABS tablet   simvastatin (ZOCOR) 20 MG tablet   tamsulosin (FLOMAX) 0.4 MG CAPS capsule    vitamin C (ASCORBIC ACID) 500 MG tablet   zoledronic acid (RECLAST) 5 MG/100ML SOLN injection   No current facility-administered medications for this encounter.     Jodell Cipro Ward, PA-C [facility] [phone]

## 2023-08-30 ENCOUNTER — Ambulatory Visit (HOSPITAL_BASED_OUTPATIENT_CLINIC_OR_DEPARTMENT_OTHER): Payer: PPO | Admitting: Anesthesiology

## 2023-08-30 ENCOUNTER — Ambulatory Visit (HOSPITAL_COMMUNITY): Payer: PPO

## 2023-08-30 ENCOUNTER — Encounter (HOSPITAL_COMMUNITY): Payer: Self-pay | Admitting: Urology

## 2023-08-30 ENCOUNTER — Ambulatory Visit (HOSPITAL_COMMUNITY): Payer: Self-pay | Admitting: Physician Assistant

## 2023-08-30 ENCOUNTER — Encounter (HOSPITAL_COMMUNITY)
Admission: RE | Disposition: A | Discharge status: Home / Self Care | Payer: Self-pay | Source: Ambulatory Visit | Attending: Urology

## 2023-08-30 ENCOUNTER — Ambulatory Visit (HOSPITAL_COMMUNITY)
Admission: RE | Admit: 2023-08-30 | Discharge: 2023-08-30 | Disposition: A | Discharge status: Home / Self Care | Payer: PPO | Source: Ambulatory Visit | Attending: Urology | Admitting: Urology

## 2023-08-30 ENCOUNTER — Other Ambulatory Visit: Payer: Self-pay

## 2023-08-30 DIAGNOSIS — N201 Calculus of ureter: Secondary | ICD-10-CM | POA: Insufficient documentation

## 2023-08-30 DIAGNOSIS — I10 Essential (primary) hypertension: Secondary | ICD-10-CM | POA: Insufficient documentation

## 2023-08-30 DIAGNOSIS — I480 Paroxysmal atrial fibrillation: Secondary | ICD-10-CM | POA: Diagnosis not present

## 2023-08-30 HISTORY — PX: CYSTOSCOPY/URETEROSCOPY/HOLMIUM LASER/STENT PLACEMENT: SHX6546

## 2023-08-30 SURGERY — CYSTOSCOPY/URETEROSCOPY/HOLMIUM LASER/STENT PLACEMENT
Anesthesia: General | Site: Ureter | Laterality: Left

## 2023-08-30 MED ORDER — CIPROFLOXACIN IN D5W 400 MG/200ML IV SOLN
INTRAVENOUS | Status: AC
  Filled 2023-08-30: qty 200

## 2023-08-30 MED ORDER — ACETAMINOPHEN 10 MG/ML IV SOLN: 1000.0000 mg | Freq: Once | INTRAVENOUS | Status: DC | PRN

## 2023-08-30 MED ORDER — CHLORHEXIDINE GLUCONATE 0.12 % MT SOLN
15.0000 mL | Freq: Once | OROMUCOSAL | Status: AC
  Administered 2023-08-30: 15 mL via OROMUCOSAL

## 2023-08-30 MED ORDER — CELECOXIB 200 MG PO CAPS: 200.0000 mg | ORAL_CAPSULE | Freq: Two times a day (BID) | ORAL | 0 refills | Status: AC

## 2023-08-30 MED ORDER — SODIUM CHLORIDE 0.9 % IR SOLN
Status: DC | PRN
  Administered 2023-08-30: 3000 mL

## 2023-08-30 MED ORDER — LACTATED RINGERS IV SOLN: INTRAVENOUS | Status: DC

## 2023-08-30 MED ORDER — PROPOFOL 10 MG/ML IV BOLUS
INTRAVENOUS | Status: DC | PRN
  Administered 2023-08-30: 100 mg via INTRAVENOUS
  Administered 2023-08-30: 50 mg via INTRAVENOUS

## 2023-08-30 MED ORDER — ORAL CARE MOUTH RINSE: 15.0000 mL | Freq: Once | OROMUCOSAL | Status: AC

## 2023-08-30 MED ORDER — SODIUM CHLORIDE 0.9 % IV SOLN: INTRAVENOUS | Status: DC

## 2023-08-30 MED ORDER — IOHEXOL 300 MG/ML  SOLN
INTRAMUSCULAR | Status: DC | PRN
  Administered 2023-08-30: 6 mL via URETHRAL
  Administered 2023-08-30: 10 mL

## 2023-08-30 MED ORDER — FENTANYL CITRATE (PF) 100 MCG/2ML IJ SOLN
INTRAMUSCULAR | Status: DC | PRN
  Administered 2023-08-30: 50 ug via INTRAVENOUS

## 2023-08-30 MED ORDER — DEXAMETHASONE SODIUM PHOSPHATE 10 MG/ML IJ SOLN
INTRAMUSCULAR | Status: DC | PRN
  Administered 2023-08-30: 10 mg via INTRAVENOUS

## 2023-08-30 MED ORDER — CIPROFLOXACIN IN D5W 400 MG/200ML IV SOLN
400.0000 mg | INTRAVENOUS | Status: AC
  Administered 2023-08-30: 400 mg via INTRAVENOUS
  Filled 2023-08-30: qty 200

## 2023-08-30 MED ORDER — HYOSCYAMINE SULFATE 0.125 MG SL SUBL: 0.1250 mg | SUBLINGUAL_TABLET | SUBLINGUAL | 0 refills | Status: DC | PRN

## 2023-08-30 MED ORDER — EPHEDRINE SULFATE-NACL 50-0.9 MG/10ML-% IV SOSY
PREFILLED_SYRINGE | INTRAVENOUS | Status: DC | PRN
Start: 2023-08-30 — End: 2023-08-30
  Administered 2023-08-30: 5 mg via INTRAVENOUS

## 2023-08-30 MED ORDER — FENTANYL CITRATE PF 50 MCG/ML IJ SOSY: 25.0000 ug | PREFILLED_SYRINGE | INTRAMUSCULAR | Status: DC | PRN

## 2023-08-30 MED ORDER — ONDANSETRON HCL 4 MG/2ML IJ SOLN
INTRAMUSCULAR | Status: DC | PRN
  Administered 2023-08-30: 4 mg via INTRAVENOUS

## 2023-08-30 MED ORDER — FENTANYL CITRATE (PF) 100 MCG/2ML IJ SOLN
INTRAMUSCULAR | Status: AC
  Filled 2023-08-30: qty 2

## 2023-08-30 MED ORDER — TRAMADOL HCL 50 MG PO TABS: 50.0000 mg | ORAL_TABLET | Freq: Four times a day (QID) | ORAL | 0 refills | Status: AC | PRN

## 2023-08-30 MED ORDER — NITROFURANTOIN MONOHYD MACRO 100 MG PO CAPS: 100.0000 mg | ORAL_CAPSULE | Freq: Two times a day (BID) | ORAL | 0 refills | Status: AC

## 2023-08-30 MED ORDER — CEFAZOLIN SODIUM-DEXTROSE 2-4 GM/100ML-% IV SOLN
2.0000 g | INTRAVENOUS | Status: AC
  Administered 2023-08-30: 2 g via INTRAVENOUS
  Filled 2023-08-30: qty 100

## 2023-08-30 SURGICAL SUPPLY — 21 items
BAG COUNTER SPONGE SURGICOUNT (BAG) IMPLANT
BAG URO CATCHER STRL LF (MISCELLANEOUS) ×1 IMPLANT
BASKET ZERO TIP NITINOL 2.4FR (BASKET) IMPLANT
CATH URETERAL DUAL LUMEN 10F (MISCELLANEOUS) ×1 IMPLANT
CATH URETL OPEN END 6FR 70 (CATHETERS) IMPLANT
CLOTH BEACON ORANGE TIMEOUT ST (SAFETY) ×1 IMPLANT
GLOVE SS BIOGEL STRL SZ 7 (GLOVE) ×1 IMPLANT
GLOVE SURG LX STRL 7.5 STRW (GLOVE) ×1 IMPLANT
GOWN STRL REUS W/ TWL XL LVL3 (GOWN DISPOSABLE) ×1 IMPLANT
GUIDEWIRE STR DUAL SENSOR (WIRE) ×1 IMPLANT
GUIDEWIRE ZIPWRE .038 STRAIGHT (WIRE) ×1 IMPLANT
IV NS 1000ML BAXH (IV SOLUTION) ×1 IMPLANT
KIT TURNOVER KIT A (KITS) IMPLANT
LASER FIB FLEXIVA PULSE ID 365 (Laser) IMPLANT
MANIFOLD NEPTUNE II (INSTRUMENTS) ×1 IMPLANT
PACK CYSTO (CUSTOM PROCEDURE TRAY) ×1 IMPLANT
SHEATH NAVIGATOR HD 11/13X36 (SHEATH) IMPLANT
STENT URET 6FRX22 CONTOUR (STENTS) IMPLANT
TRACTIP FLEXIVA PULS ID 200XHI (Laser) IMPLANT
TUBING CONNECTING 10 (TUBING) ×1 IMPLANT
TUBING UROLOGY SET (TUBING) ×1 IMPLANT

## 2023-08-30 NOTE — Op Note (Signed)
Operative Note  Preoperative diagnosis:  1.  Left ureteral stone  Postoperative diagnosis: 1.  same  Procedure(s): 1.  Left ureteroscopy 2. Cystoscopy  3. Left retrograde pyelogram 4. Left ureteral stent placement 5. Fluoroscopy with intraoperative interpretation  Surgeon: Irine Seal, MD  Assistants:  None  Anesthesia:  General  Complications:  None  EBL:  Minimal  Specimens: 1. none  Drains/Catheters: 1.  Left 6Fr x 22cm ureteral stent with tether  Intraoperative findings:   Cystoscopy demonstrated unremarkable bladder. There was some mild edema at the left UO Ureteroscopy demonstrated no stone in left ureter or kidney Successful stent placement.  Retrograde pyelogram performed - showed mild dilation of the ureter and kidney without filling defects  Description of procedure: After informed consent was obtained from the patient, the patient was identified and taken to the operating room and placed in the supine position.  General anesthesia was administered as well as perioperative IV antibiotics.  At the beginning of the case, a time-out was performed to properly identify the patient, the surgery to be performed, and the surgical site.  Sequential compression devices were applied to the lower extremities at the beginning of the case for DVT prophylaxis.  The patient was then placed in the dorsal lithotomy supine position, prepped and draped in sterile fashion.  We then passed the 21-French rigid cystoscope through the urethra and into the bladder under vision without any difficulty, noting a normal urethra.  A systematic evaluation of the bladder revealed no evidence of any suspicious bladder lesions.  Ureteral orifices were in normal position. There was mild edema of the left UO  We then passed a 0.038 glide wire up to the level of the renal pelvis. I then inserted the semi rigid ureteroscope. The ureter was examined and there was no stone identified. I examined the  length of the ureter x3 and no stone was found. Retrograde was performed through the scope and findings as above. I placed a glide wire through the scope and withdrew the rigid ureteroscope  I then was able to insert a flexible ureteroscope over the wire into the kidney. I carefully examined each calyx x 2 and no stones were identified. The flexible ureteroscope was carefully withdrawn - there were again no stone fragments or damage to the ureter.   Once the ureteroscope was removed, we then used the Glidewire under fluoroscopic guidance and passed up a 6-French x 22 cm double-pigtail ureteral stent up the ureter, making sure that the proximal and distal ends coiled within the kidney and bladder respectively.  Note that we left a tether string attached to the distal end of the ureteral stent and it exited the urethral meatus.   The patient tolerated the procedure well and there was no complication. Patient was awoken from anesthesia and taken to the recovery room in stable condition. I was present and scrubbed for the entirety of the case.  Plan:  Patient will be discharged home and may remove stent in 3 days   G. Irine Seal MD [facility]  Pager: [phone]

## 2023-08-30 NOTE — Anesthesia Postprocedure Evaluation (Signed)
Anesthesia Post Note  Patient: DELA HEIMANN  Procedure(s) Performed: CYSTOSCOPY, LEFT URETEROSCOPY, LEFT URETERAL STENT PLACEMENT (Left: Ureter)     Patient location during evaluation: PACU Anesthesia Type: General Level of consciousness: awake and alert Pain management: pain level controlled Vital Signs Assessment: post-procedure vital signs reviewed and stable Respiratory status: spontaneous breathing, nonlabored ventilation, respiratory function stable and patient connected to nasal cannula oxygen Cardiovascular status: blood pressure returned to baseline and stable Postop Assessment: no apparent nausea or vomiting Anesthetic complications: no   No notable events documented.  Last Vitals:  Vitals:   08/30/23 1500 08/30/23 1515  BP: 122/64 124/66  Pulse: (!) 58 (!) 58  Resp:    Temp:    SpO2: 96% 96%    Last Pain:  Vitals:   08/30/23 1456  TempSrc:   PainSc: 3                  Malayzia Laforte P Vernell Townley

## 2023-08-30 NOTE — Anesthesia Preprocedure Evaluation (Signed)
Anesthesia Evaluation  Patient identified by MRN, date of birth, ID band Patient awake    Reviewed: Allergy & Precautions, NPO status , Patient's Chart, lab work & pertinent test results  Airway Mallampati: II  TM Distance: >3 FB Neck ROM: Full    Dental no notable dental hx.    Pulmonary neg pulmonary ROS   Pulmonary exam normal        Cardiovascular hypertension, Pt. on medications + dysrhythmias Atrial Fibrillation  Rhythm:Irregular Rate:Normal     Neuro/Psych negative neurological ROS  negative psych ROS   GI/Hepatic Neg liver ROS,GERD  ,,  Endo/Other  negative endocrine ROS    Renal/GU negative Renal ROSUreteral calculi   negative genitourinary   Musculoskeletal  (+) Arthritis , Osteoarthritis,    Abdominal Normal abdominal exam  (+)   Peds  Hematology Lab Results      Component                Value               Date                      WBC                      6.9                 08/19/2023                HGB                      13.5                08/19/2023                HCT                      40.9                08/19/2023                MCV                      91.5                08/19/2023                PLT                      244                 08/19/2023              Anesthesia Other Findings   Reproductive/Obstetrics                              Anesthesia Physical Anesthesia Plan  ASA: 3  Anesthesia Plan: General   Post-op Pain Management:    Induction: Intravenous  PONV Risk Score and Plan: 3 and Ondansetron, Dexamethasone and Treatment may vary due to age or medical condition  Airway Management Planned: Mask and LMA  Additional Equipment: None  Intra-op Plan:   Post-operative Plan: Extubation in OR  Informed Consent: I have reviewed the patients History and Physical, chart, labs and discussed the procedure including the risks, benefits and  alternatives for the proposed anesthesia with the  patient or authorized representative who has indicated his/her understanding and acceptance.     Dental advisory given  Plan Discussed with: CRNA  Anesthesia Plan Comments:          Anesthesia Quick Evaluation

## 2023-08-30 NOTE — Transfer of Care (Signed)
Immediate Anesthesia Transfer of Care Note  Patient: Alyssa Martin  Procedure(s) Performed: CYSTOSCOPY, LEFT URETEROSCOPY, LEFT URETERAL STENT PLACEMENT (Left: Ureter)  Patient Location: PACU  Anesthesia Type:General  Level of Consciousness: drowsy  Airway & Oxygen Therapy: Patient Spontanous Breathing and Patient connected to face mask  Post-op Assessment: Report given to RN  Post vital signs: Reviewed and stable  Last Vitals:  Vitals Value Taken Time  BP 119/53 08/30/23 1335  Temp 36.4 C 08/30/23 1335  Pulse 63 08/30/23 1337  Resp 10 08/30/23 1337  SpO2 100 % 08/30/23 1337  Vitals shown include unfiled device data.  Last Pain:  Vitals:   08/30/23 1045  TempSrc: Oral  PainSc: 0-No pain         Complications: No notable events documented.

## 2023-08-30 NOTE — H&P (Signed)
H&P  History of Present Illness: Alyssa Martin is a 77 y.o. year old F who presents today for treatment of a left ureteral stone  No acute complaints  Past Medical History:  Diagnosis Date   A-fib (HCC)    Arthritis    Cancer (HCC)    melanoma on arm   Closed fracture of left proximal humerus    Closed right ankle fracture    Elevated BP    GERD (gastroesophageal reflux disease)    Hyperlipidemia    Hypertension    Impaired fasting glucose    Osteopenia    Osteoporosis    Paroxysmal atrial fibrillation (HCC)    Tinnitus    Vitamin D deficiency    Wears glasses     Past Surgical History:  Procedure Laterality Date   COLONOSCOPY  05/10/2011   DIAGNOSTIC MAMMOGRAM  10 /09/2010   ORIF HUMERUS FRACTURE Left 08/18/2017   Procedure: OPEN REDUCTION INTERNAL FIXATION (ORIF) LEFT PROXIMAL HUMERUS FRACTURE;  Surgeon: Cammy Copa, MD;  Location: [facility] OR;  Service: Orthopedics;  Laterality: Left;   TIBIA FRACTURE SURGERY  04/2006   Left--From falling down the stairs   TIBIA FRACTURE SURGERY     In her 40's    Home Medications:  Current Meds  Medication Sig   diltiazem (DILT-XR) 180 MG 24 hr capsule Take 1 capsule (180 mg total) by mouth daily.   ondansetron (ZOFRAN-ODT) 8 MG disintegrating tablet Take 1 tablet (8 mg total) by mouth every 8 (eight) hours as needed for nausea or vomiting.   oxyCODONE (OXY IR/ROXICODONE) 5 MG immediate release tablet Take 5 mg by mouth every 6 (six) hours as needed for severe pain (pain score 7-10).   oxyCODONE-acetaminophen (PERCOCET/ROXICET) 5-325 MG tablet Take 1 tablet by mouth every 6 (six) hours as needed for severe pain (pain score 7-10).   rivaroxaban (XARELTO) 20 MG TABS tablet TAKE 1 TABLET BY MOUTH DAILY WITH SUPPER   simvastatin (ZOCOR) 20 MG tablet Take 20 mg by mouth every evening.   tamsulosin (FLOMAX) 0.4 MG CAPS capsule Take 1 capsule (0.4 mg total) by mouth daily.    Allergies:  Allergies  Allergen Reactions   Latex Hives    Penicillins     UNSPECIFIED REACTION    Ativan [Lorazepam] Nausea Only    Family History  Problem Relation Age of Onset   Hypertension Mother    Osteoarthritis Mother    Bone cancer Father    Osteopenia Father    Lung cancer Brother    Lung cancer Paternal Grandmother    Leukemia Paternal Grandfather    Bone cancer Brother     Social History:  reports that she has never smoked. She has never used smokeless tobacco. She reports that she does not drink alcohol and does not use drugs.  ROS: A complete review of systems was performed.  All systems are negative except for pertinent findings as noted.  Physical Exam:  Vital signs in last 24 hours: Temp:  [97.7 F (36.5 C)] 97.7 F (36.5 C) (12/10 1045) Pulse Rate:  [65] 65 (12/10 1045) Resp:  [16] 16 (12/10 1045) BP: (133)/(74) 133/74 (12/10 1045) SpO2:  [97 %] 97 % (12/10 1045) Weight:  [54.4 kg] 54.4 kg (12/10 1045) Constitutional:  Alert and oriented, No acute distress Cardiovascular: Regular rate and rhythm Respiratory: Normal respiratory effort, Lungs clear bilaterally GI: Abdomen is soft, nontender, nondistended, no abdominal masses Lymphatic: No lymphadenopathy Neurologic: Grossly intact, no focal deficits Psychiatric: Normal mood and  affect   Laboratory Data:  No results for input(s): "WBC", "HGB", "HCT", "PLT" in the last 72 hours.  No results for input(s): "NA", "K", "CL", "GLUCOSE", "BUN", "CALCIUM", "CREATININE" in the last 72 hours.  Invalid input(s): "CO3"   No results found for this or any previous visit (from the past 24 hour(s)). No results found for this or any previous visit (from the past 240 hour(s)).  Renal Function: No results for input(s): "CREATININE" in the last 168 hours. Estimated Creatinine Clearance: 43.6 mL/min (by C-G formula based on SCr of 0.78 mg/dL).  Radiologic Imaging: No results found.  Assessment:  Alyssa Martin is a 77 y.o. year old F with left ureteral stone    Plan:  --to OR as planned for left ureteroscopy with laser litho, stent. Procedure and risks reviewed, including but not limited to hematuria, infection, sepsis, damage to GU tract, failure to complete procedure, retained stone fragments, need for future procedures, stent pain, prolonged stent.   Irine Seal, MD 08/30/2023, 12:36 PM  [facility] Pager: [phone]

## 2023-08-30 NOTE — Discharge Instructions (Addendum)
[facility] [phone] Post Ureteroscopy With or Without Stent Instructions **ok to remove stent by pulling on string Friday morning  Definitions:  Ureter: The duct that transports urine from the kidney to the bladder. Stent:   A plastic hollow tube that is placed into the ureter, from the kidney to the bladder to prevent the ureter from swelling shut.  GENERAL INSTRUCTIONS:  Despite the fact that no skin incisions were used, the area around the ureter and bladder is raw and irritated. The stent is a foreign body which will further irritate the bladder wall. This irritation is manifested by increased frequency of urination, both day and night, and by an increase in the urge to urinate. In some, the urge to urinate is present almost always. Sometimes the urge is strong enough that you may not be able to stop yourself from urinating. The only real cure is to remove the stent and then give time for the bladder wall to heal which can't be done until the danger of the ureter swelling shut has passed, which varies.  You may see some blood in your urine while the stent is in place and a few days afterwards. Do not be alarmed, even if the urine was clear for a while. Get off your feet and drink lots of fluids until clearing occurs. If you start to pass clots or don't improve, call us.  DIET: You may return to your normal diet immediately. Because of the raw surface of your bladder, alcohol, spicy foods, acid type foods and drinks with caffeine may cause irritation or frequency and should be used in moderation. To keep your urine flowing freely and to avoid constipation, drink plenty of fluids during the day ( 8-10 glasses ). Tip: Avoid cranberry juice because it is very acidic.  ACTIVITY: Your physical activity doesn't need to be restricted. However, if you are very active, you may see some blood in your urine. We suggest that you reduce your activity under these circumstances until  the bleeding has stopped.  BOWELS: It is important to keep your bowels regular during the postoperative period. Straining with bowel movements can cause bleeding. A bowel movement every other day is reasonable. Use a mild laxative if needed, such as Milk of Magnesia 2-3 tablespoons, or 2 Dulcolax tablets. Call if you continue to have problems. If you have been taking narcotics for pain, before, during or after your surgery, you may be constipated. Take a laxative if necessary.   MEDICATION: You should resume your pre-surgery medications unless told not to. In addition you will often be given an antibiotic to prevent infection and likely several as needed medications for stent related discomfort. These should be taken as prescribed until the bottles are finished unless you are having an unusual reaction to one of the drugs.  PROBLEMS YOU SHOULD REPORT TO [place]: Fevers over 100.5 Fahrenheit. Heavy bleeding, or clots ( See above notes about blood in urine ). Inability to urinate. Drug reactions ( hives, rash, nausea, vomiting, diarrhea ). Severe burning or pain with urination that is not improving.

## 2023-08-30 NOTE — Anesthesia Procedure Notes (Signed)
Procedure Name: LMA Insertion Date/Time: 08/30/2023 12:53 PM  Performed by: Randa Evens, CRNAPre-anesthesia Checklist: Patient identified, Emergency Drugs available, Suction available and Patient being monitored Patient Re-evaluated:Patient Re-evaluated prior to induction Oxygen Delivery Method: Circle System Utilized Preoxygenation: Pre-oxygenation with 100% oxygen Induction Type: IV induction Ventilation: Mask ventilation without difficulty LMA: LMA inserted LMA Size: 4.0 Number of attempts: 1 Airway Equipment and Method: Bite block Placement Confirmation: positive ETCO2 Tube secured with: Tape Dental Injury: Teeth and Oropharynx as per pre-operative assessment

## 2023-08-31 ENCOUNTER — Encounter (HOSPITAL_COMMUNITY): Payer: Self-pay | Admitting: Urology

## 2023-08-31 ENCOUNTER — Telehealth: Payer: Self-pay | Admitting: Cardiology

## 2023-08-31 NOTE — Telephone Encounter (Signed)
Patient called stating she had outpatient surgery.  She states she was given a script for celecoxib 200mg , patient wants to know if it's okay to take with Xarelto. Please advise.

## 2023-08-31 NOTE — Telephone Encounter (Signed)
Notified patient per Dr Elvis Coil message below. Patient stated she called Dr Junius Argyle office and spoke to his triage nurse. She is waiting to here back from his office.   Agree not recommended to take NSAIDs with Xarelto. Would try tylenol instead  Peter Swaziland MD, [facility]

## 2023-08-31 NOTE — Telephone Encounter (Signed)
Called and spoke to patient who is calling to make sure it is okay to take Celebrex and Xarelato together. Patent recently had surgery and was release yesterday. She was prescribe Celebrex 200 mg BID for 14 days. After speaking with Dr Servando Salina, patient was advised to ask Dr Lafonda Mosses to prescribe he another medication. Patient will call his office for another medication.

## 2023-09-12 DIAGNOSIS — M810 Age-related osteoporosis without current pathological fracture: Secondary | ICD-10-CM | POA: Diagnosis not present

## 2023-09-12 DIAGNOSIS — I10 Essential (primary) hypertension: Secondary | ICD-10-CM | POA: Diagnosis not present

## 2023-09-12 DIAGNOSIS — E559 Vitamin D deficiency, unspecified: Secondary | ICD-10-CM | POA: Diagnosis not present

## 2023-09-12 DIAGNOSIS — E785 Hyperlipidemia, unspecified: Secondary | ICD-10-CM | POA: Diagnosis not present

## 2023-09-12 DIAGNOSIS — R7301 Impaired fasting glucose: Secondary | ICD-10-CM | POA: Diagnosis not present

## 2023-09-19 ENCOUNTER — Encounter (INDEPENDENT_AMBULATORY_CARE_PROVIDER_SITE_OTHER): Payer: PPO

## 2023-09-19 DIAGNOSIS — Z719 Counseling, unspecified: Secondary | ICD-10-CM

## 2023-09-20 DIAGNOSIS — M810 Age-related osteoporosis without current pathological fracture: Secondary | ICD-10-CM | POA: Diagnosis not present

## 2023-09-20 DIAGNOSIS — R7301 Impaired fasting glucose: Secondary | ICD-10-CM | POA: Diagnosis not present

## 2023-09-20 DIAGNOSIS — R82998 Other abnormal findings in urine: Secondary | ICD-10-CM | POA: Diagnosis not present

## 2023-09-20 DIAGNOSIS — D6869 Other thrombophilia: Secondary | ICD-10-CM | POA: Diagnosis not present

## 2023-09-20 DIAGNOSIS — F5101 Primary insomnia: Secondary | ICD-10-CM | POA: Diagnosis not present

## 2023-09-20 DIAGNOSIS — Z0000 Encounter for general adult medical examination without abnormal findings: Secondary | ICD-10-CM | POA: Diagnosis not present

## 2023-09-20 DIAGNOSIS — I480 Paroxysmal atrial fibrillation: Secondary | ICD-10-CM | POA: Diagnosis not present

## 2023-09-20 DIAGNOSIS — I10 Essential (primary) hypertension: Secondary | ICD-10-CM | POA: Diagnosis not present

## 2023-09-20 DIAGNOSIS — Z1331 Encounter for screening for depression: Secondary | ICD-10-CM | POA: Diagnosis not present

## 2023-09-20 DIAGNOSIS — E785 Hyperlipidemia, unspecified: Secondary | ICD-10-CM | POA: Diagnosis not present

## 2023-09-20 DIAGNOSIS — F3342 Major depressive disorder, recurrent, in full remission: Secondary | ICD-10-CM | POA: Diagnosis not present

## 2023-09-20 DIAGNOSIS — N201 Calculus of ureter: Secondary | ICD-10-CM | POA: Diagnosis not present

## 2023-09-20 DIAGNOSIS — Z1339 Encounter for screening examination for other mental health and behavioral disorders: Secondary | ICD-10-CM | POA: Diagnosis not present

## 2023-09-20 NOTE — Progress Notes (Signed)
 Cardiology Office Note   Date:  09/26/2023   ID:  Alyssa Martin, Alyssa Martin 1945-10-18, MRN [id number]  PCP:  Loreli Elsie JONETTA Mickey., MD  Cardiologist:   Reyden Smith, MD   Chief Complaint  Patient presents with   Follow-up    12 months.     History of Present Illness: Alyssa Martin is a 77 y.o. female who presents for follow up Afib. She has a history of paroxysmal atrial fibrillation dating back to 2013.  She has been on cardizem  and Xarelto .    She denies any significant palpitations over the past year until the past 3 weeks has noted more Afib. She does have a [employer] device that has shown NSR but today felt her heart to be out of rhythm which it is. She does note dizziness with Flomax . She is still exercising at [facility] doing balance classes and water aerobics.   On Dec 10  she had ureteroscopy and stent placement for a kidney stone. She was started on Flomax . Notes more dizziness since started on this.     Past Medical History:  Diagnosis Date   A-fib (HCC)    Arthritis    Cancer (HCC)    melanoma on arm   Closed fracture of left proximal humerus    Closed right ankle fracture    Elevated BP    GERD (gastroesophageal reflux disease)    Hyperlipidemia    Hypertension    Impaired fasting glucose    Osteopenia    Osteoporosis    Paroxysmal atrial fibrillation (HCC)    Tinnitus    Vitamin D deficiency    Wears glasses     Past Surgical History:  Procedure Laterality Date   COLONOSCOPY  05/10/2011   CYSTOSCOPY/URETEROSCOPY/HOLMIUM LASER/STENT PLACEMENT Left 08/30/2023   Procedure: CYSTOSCOPY, LEFT URETEROSCOPY, LEFT URETERAL STENT PLACEMENT;  Surgeon: Lovie Arlyss CROME, MD;  Location: [facility];  Service: Urology;  Laterality: Left;  60 MINUTES   DIAGNOSTIC MAMMOGRAM  10 /09/2010   ORIF HUMERUS FRACTURE Left 08/18/2017   Procedure: OPEN REDUCTION INTERNAL FIXATION (ORIF) LEFT PROXIMAL HUMERUS FRACTURE;  Surgeon: Addie Cordella Hamilton, MD;  Location: [facility] OR;  Service:  Orthopedics;  Laterality: Left;   TIBIA FRACTURE SURGERY  04/2006   Left--From falling down the stairs   TIBIA FRACTURE SURGERY     In her 40's     Current Outpatient Medications  Medication Sig Dispense Refill   Calcium Citrate (CITRACAL PO) Take 630 mg by mouth 2 (two) times daily.     cetirizine (ZYRTEC) 10 MG tablet Take 10 mg by mouth daily as needed for allergies.     Coenzyme Q10 (COQ10) 200 MG CAPS Take 200 mg daily     diltiazem  (DILT-XR) 180 MG 24 hr capsule Take 1 capsule (180 mg total) by mouth daily. 90 capsule 3   ergocalciferol (VITAMIN D2) 50000 UNITS capsule Take 50,000 Units by mouth every 14 (fourteen) days.     Estradiol 10 MCG TABS vaginal tablet Place 1 tablet vaginally daily as needed (vaginal dryness).     famotidine  (PEPCID ) 20 MG tablet Take 20 mg by mouth as needed for heartburn or indigestion.     fluticasone (FLONASE) 50 MCG/ACT nasal spray Place into both nostrils daily as needed for allergies or rhinitis.     hyoscyamine  (LEVSIN /SL) 0.125 MG SL tablet Place 1 tablet (0.125 mg total) under the tongue every 4 (four) hours as needed (bladder spasms). 30 tablet 0   loratadine (CLARITIN) 10  MG tablet Take 10 mg by mouth daily as needed for allergies.     methocarbamol  (ROBAXIN ) 750 MG tablet Take 750 mg by mouth at bedtime as needed for muscle spasms.     mometasone (NASONEX) 50 MCG/ACT nasal spray Place 2 sprays into the nose daily as needed (allergies).     Multiple Vitamin (MULTIVITAMIN) tablet Take 1 tablet by mouth daily.     ondansetron  (ZOFRAN -ODT) 8 MG disintegrating tablet Take 1 tablet (8 mg total) by mouth every 8 (eight) hours as needed for nausea or vomiting. 20 tablet 0   oxyCODONE  (OXY IR/ROXICODONE ) 5 MG immediate release tablet Take 5 mg by mouth every 6 (six) hours as needed for severe pain (pain score 7-10).     oxyCODONE -acetaminophen  (PERCOCET/ROXICET) 5-325 MG tablet Take 1 tablet by mouth every 6 (six) hours as needed for severe pain (pain  score 7-10). 10 tablet 0   rivaroxaban  (XARELTO ) 20 MG TABS tablet TAKE 1 TABLET BY MOUTH DAILY WITH SUPPER 90 tablet 1   simvastatin  (ZOCOR ) 20 MG tablet Take 20 mg by mouth every evening.     vitamin C (ASCORBIC ACID) 500 MG tablet Take 500 mg by mouth daily.     zoledronic  acid (RECLAST ) 5 MG/100ML SOLN injection Inject 5 mg into the vein once. Yearly     No current facility-administered medications for this visit.    Allergies:   Latex, Penicillins, and Ativan [lorazepam]    Social History:  The patient  reports that she has never smoked. She has never used smokeless tobacco. She reports that she does not drink alcohol  and does not use drugs.   Family History:  The patient's family history includes Bone cancer in her brother and father; Hypertension in her mother; Leukemia in her paternal grandfather; Lung cancer in her brother and paternal grandmother; Osteoarthritis in her mother; Osteopenia in her father.    ROS:  Please see the history of present illness.   Otherwise, review of systems are positive for none.   All other systems are reviewed and negative.    PHYSICAL EXAM: VS:  BP 128/60 (BP Location: Left Arm, Patient Position: Sitting, Cuff Size: Normal)   Pulse (!) 129   Ht 4' 11 (1.499 m)   Wt 128 lb (58.1 kg)   BMI 25.85 kg/m  , BMI Body mass index is 25.85 kg/m. GEN: Well nourished, well developed, in no acute distress  HEENT: normal  Neck: no JVD, carotid bruits, or masses Cardiac: IRRR; no murmurs, rubs, or gallops,no edema  Respiratory:  clear to auscultation bilaterally, normal work of breathing GI: soft, nontender, nondistended, + BS MS: no deformity or atrophy  Skin: warm and dry, no rash Neuro:  Strength and sensation are intact Psych: euthymic mood, full affect   EKG Interpretation Date/Time:  Monday September 26 2023 14:28:41 EST Ventricular Rate:  129 PR Interval:    QRS Duration:  76 QT Interval:  316 QTC Calculation: 462 R Axis:   16  Text  Interpretation: Atrial fibrillation with rapid ventricular response with premature ventricular or aberrantly conducted complexes Nonspecific ST and T wave abnormality When compared with ECG of 19-Aug-2023 08:40, atrial fibrillation has replaced NSR Confirmed by Gia Lusher [phone]) on 09/26/2023 2:30:28 PM    Recent Labs: 08/19/2023: ALT 18; BUN 24; Creatinine, Ser 0.78; Hemoglobin 13.5; Platelets 244; Potassium 4.0; Sodium 141    Lipid Panel No results found for: CHOL, TRIG, HDL, CHOLHDL, VLDL, LDLCALC, LDLDIRECT   Dated 07/17/19: cholesterol 161, triglycerides 83,  HDL 65, LDL79. A1c 5.5%. normal CBC and TSH Dated 11/22/19: normal CMET. Dated 08/19/21: cholesterol 155, triglycerides 82, HDL 53, LDL 86. A1c 5.5%. CMET and TSH normal. Dated 08/20/22: cholesterol 157, triglycerides 91, HDL 64, LDL 75. A1c 5.3%. CMET and TSH normal Dated 09/12/23: cholesterol 157, triglycerides 85, HDL 61, LDL 79. A1c 5.6%. CBC, CMET and TSH normal.   Wt Readings from Last 3 Encounters:  09/26/23 128 lb (58.1 kg)  08/30/23 120 lb (54.4 kg)  08/19/23 120 lb (54.4 kg)      ASSESSMENT AND PLAN:  1. Paroxysmal atrial fibrillation. She is in Afib today. Mildly symptomatic. On Cardizem  for rate control. On Xarelto  for anticoagulation. This has been uninterrupted for 3 weeks. Her history would suggest that she will convert on her own so will monitor for now. If she doesn't convert in the next week we could arrange a DCCV.      Current medicines are reviewed at length with the patient today.  The patient does not have concerns regarding medicines.  The following changes have been made:  no change  Labs/ tests ordered today include:   Orders Placed This Encounter  Procedures   EKG 12-Lead      Disposition: FU in 6 months   Signed, Brazos Sandoval, MD  09/26/2023 2:44 PM    [facility] [address], [place], [place], [address] Phone [phone], Fax [phone]

## 2023-09-26 ENCOUNTER — Ambulatory Visit: Payer: PPO | Attending: Cardiology | Admitting: Cardiology

## 2023-09-26 ENCOUNTER — Encounter: Payer: Self-pay | Admitting: Cardiology

## 2023-09-26 VITALS — BP 128/60 | HR 129 | Ht 59.0 in | Wt 128.0 lb

## 2023-09-26 DIAGNOSIS — I480 Paroxysmal atrial fibrillation: Secondary | ICD-10-CM

## 2023-09-26 DIAGNOSIS — L738 Other specified follicular disorders: Secondary | ICD-10-CM | POA: Diagnosis not present

## 2023-09-26 DIAGNOSIS — Z85820 Personal history of malignant melanoma of skin: Secondary | ICD-10-CM | POA: Diagnosis not present

## 2023-09-26 NOTE — Patient Instructions (Signed)
 Medication Instructions:  Continue same medications *If you need a refill on your cardiac medications before your next appointment, please call your pharmacy*   Lab Work: None ordered   Testing/Procedures: None ordered   Follow-Up: At [facility], you and your health needs are our priority.  As part of our continuing mission to provide you with exceptional heart care, we have created designated Provider Care Teams.  These Care Teams include your primary Cardiologist (physician) and Advanced Practice Providers (APPs -  Physician Assistants and Nurse Practitioners) who all work together to provide you with the care you need, when you need it.  We recommend signing up for the patient portal called MyChart.  Sign up information is provided on this After Visit Summary.  MyChart is used to connect with patients for Virtual Visits (Telemedicine).  Patients are able to view lab/test results, encounter notes, upcoming appointments, etc.  Non-urgent messages can be sent to your provider as well.   To learn more about what you can do with MyChart, go to [email or web address].    Your next appointment:  6 months    Call in March to schedule July appointment     Provider:  Dr.Jordan   Call office next week if still in Afib

## 2023-09-27 DIAGNOSIS — Z23 Encounter for immunization: Secondary | ICD-10-CM | POA: Diagnosis not present

## 2023-10-05 DIAGNOSIS — H26492 Other secondary cataract, left eye: Secondary | ICD-10-CM | POA: Diagnosis not present

## 2023-10-11 DIAGNOSIS — N201 Calculus of ureter: Secondary | ICD-10-CM | POA: Diagnosis not present

## 2023-10-12 ENCOUNTER — Telehealth (INDEPENDENT_AMBULATORY_CARE_PROVIDER_SITE_OTHER): Payer: Self-pay | Admitting: Otolaryngology

## 2023-10-12 NOTE — Telephone Encounter (Signed)
confirmed appt & location [id number] afm

## 2023-10-13 ENCOUNTER — Encounter (INDEPENDENT_AMBULATORY_CARE_PROVIDER_SITE_OTHER): Payer: Self-pay

## 2023-10-13 ENCOUNTER — Ambulatory Visit (INDEPENDENT_AMBULATORY_CARE_PROVIDER_SITE_OTHER): Payer: Self-pay | Admitting: Audiology

## 2023-10-13 ENCOUNTER — Ambulatory Visit (INDEPENDENT_AMBULATORY_CARE_PROVIDER_SITE_OTHER): Payer: PPO | Admitting: Otolaryngology

## 2023-10-13 VITALS — BP 130/81 | HR 62 | Ht 59.0 in | Wt 123.0 lb

## 2023-10-13 DIAGNOSIS — R42 Dizziness and giddiness: Secondary | ICD-10-CM

## 2023-10-13 DIAGNOSIS — H9312 Tinnitus, left ear: Secondary | ICD-10-CM

## 2023-10-13 DIAGNOSIS — H6123 Impacted cerumen, bilateral: Secondary | ICD-10-CM

## 2023-10-13 DIAGNOSIS — H9042 Sensorineural hearing loss, unilateral, left ear, with unrestricted hearing on the contralateral side: Secondary | ICD-10-CM

## 2023-10-13 DIAGNOSIS — H9190 Unspecified hearing loss, unspecified ear: Secondary | ICD-10-CM

## 2023-10-13 DIAGNOSIS — H8102 Meniere's disease, left ear: Secondary | ICD-10-CM

## 2023-10-13 NOTE — Progress Notes (Signed)
  [address], [address] [place], [place] [address] [phone]  Hearing Aid Check     JAKKI FERRIN came to see Dr. Suszanne Conners and reported that wanted her aid to be cleaned.      Left  Hearing aid manufacturer [employer] Intent 3 miniRITE R [id number]  Hearing aid style Receiver in the canal  Hearing aid battery rechargeable  Receiver   Dome/ custom earpiece   Retention wire yes  Warranty expiration date 12-22-2025  Loss and Damage   Additional accessories Expiration date   Initial fitting date 12-27-2022  Device was fit at: Dr. [facility] clinic    Chief complaint: Patient reports the left aid needs to be cleaned.  Actions taken: Inspection of the device and listening check showed that wax filter was clogged. I demonstrated the patient how to clean it.  Services fee: $0 was paid at checkout.    Recommend: Return for a hearing aid check, as needed. Return for a hearing evaluation and to see an ENT, if concerns with hearing changes arise.    Ninoska Goswick MARIE LEROUX-MARTINEZ, AUD

## 2023-10-14 DIAGNOSIS — R42 Dizziness and giddiness: Secondary | ICD-10-CM | POA: Insufficient documentation

## 2023-10-14 DIAGNOSIS — H6123 Impacted cerumen, bilateral: Secondary | ICD-10-CM | POA: Insufficient documentation

## 2023-10-14 DIAGNOSIS — H9042 Sensorineural hearing loss, unilateral, left ear, with unrestricted hearing on the contralateral side: Secondary | ICD-10-CM | POA: Insufficient documentation

## 2023-10-14 DIAGNOSIS — H9312 Tinnitus, left ear: Secondary | ICD-10-CM | POA: Insufficient documentation

## 2023-10-14 NOTE — Progress Notes (Unsigned)
Patient ID: Alyssa Martin, female   DOB: [birth year], 78 y.o.   MRN: [id number]  Follow-up: Recurrent dizziness, left ear Mnire's disease, left ear tinnitus, left ear hearing loss  HPI: The patient is a 78 year old female who returns today for her follow-up evaluation.  The patient was previously seen for recurrent dizziness, asymmetric left ear tinnitus, and left ear hearing loss.  Her MRI scan was negative for retrocochlear lesion.  She was diagnosed with left ear Mnire's disease.  She was placed on a low-salt diet.  The patient returns today reporting no more dizziness.  She continues to have left ear tinnitus.  She uses a noise machine at home to treat the tinnitus.  Currently she denies any otalgia, otorrhea, vertigo, or change in her hearing.  Exam: General: Communicates without difficulty, well nourished, no acute distress. Head: Normocephalic, no evidence injury, no tenderness, facial buttresses intact without stepoff. Face/sinus: No tenderness to palpation and percussion. Facial movement is normal and symmetric. Eyes: PERRL, EOMI. No scleral icterus, conjunctivae clear. Neuro: CN II exam reveals vision grossly intact.  No nystagmus at any point of gaze. Ears: Auricles well formed without lesions.  Bilateral cerumen impaction.  Nose: External evaluation reveals normal support and skin without lesions.  Dorsum is intact.  Anterior rhinoscopy reveals congested mucosa over anterior aspect of inferior turbinates and intact septum.  No purulence noted. Oral:  Oral cavity and oropharynx are intact, symmetric, without erythema or edema.  Mucosa is moist without lesions. Neck: Full range of motion without pain.  There is no significant lymphadenopathy.  No masses palpable.  Thyroid bed within normal limits to palpation.  Parotid glands and submandibular glands equal bilaterally without mass.  Trachea is midline. Neuro:  CN 2-12 grossly intact. Vestibular: No nystagmus at any point of gaze.  Vestibular:  There is no nystagmus with pneumatic pressure on either tympanic membrane or Valsalva. The cerebellar examination is unremarkable.    Procedure: Bilateral cerumen disimpaction Anesthesia: None Description: Under the operating microscope, the cerumen is carefully removed with a combination of cerumen currette, alligator forceps, and suction catheters.  After the cerumen is removed, the TMs are noted to be normal.  No mass, erythema, or lesions. The patient tolerated the procedure well.    Assessment: 1.  Bilateral cerumen impaction.  After the disimpaction procedure, both tympanic membranes and middle ear spaces are noted to be normal. 2.  Subjectively stable asymmetric left ear sensorineural hearing loss and tinnitus.  A previous MRI scan was negative. 3.  Left ear Mnire's disease.  The dizziness is currently under control with a low-salt diet.  Plan: 1.  Otomicroscopy with bilateral cerumen disimpaction.  She 2.  The physical exam findings are reviewed with the patient. 3.  Continue with the 1500 mg low-salt diet. 4.  The patient will return for reevaluation in 6 months.

## 2023-10-17 DIAGNOSIS — Z719 Counseling, unspecified: Secondary | ICD-10-CM

## 2023-10-18 DIAGNOSIS — Z1231 Encounter for screening mammogram for malignant neoplasm of breast: Secondary | ICD-10-CM | POA: Diagnosis not present

## 2023-10-18 DIAGNOSIS — N952 Postmenopausal atrophic vaginitis: Secondary | ICD-10-CM | POA: Diagnosis not present

## 2023-10-18 DIAGNOSIS — Z124 Encounter for screening for malignant neoplasm of cervix: Secondary | ICD-10-CM | POA: Diagnosis not present

## 2023-10-18 DIAGNOSIS — Z6826 Body mass index (BMI) 26.0-26.9, adult: Secondary | ICD-10-CM | POA: Diagnosis not present

## 2024-01-16 ENCOUNTER — Other Ambulatory Visit: Payer: Self-pay | Admitting: Cardiology

## 2024-01-16 NOTE — Telephone Encounter (Signed)
 Prescription refill request for Xarelto  received.  Indication: PAF Last office visit: 09/26/23  P [place] MD Weight: 58.1kg Age: 78 Scr: 0.78 on 08/19/23  Epic CrCl: 54.52  Based on above findings Xarelto  20mg  daily is the appropriate dose.  Refill approved.

## 2024-01-17 ENCOUNTER — Other Ambulatory Visit: Payer: Self-pay | Admitting: *Deleted

## 2024-01-17 NOTE — Telephone Encounter (Signed)
 error

## 2024-03-21 NOTE — Progress Notes (Signed)
 Cardiology Office Note   Date:  03/26/2024   ID:  Alyssa Martin, Alyssa Martin [birth year], MRN [id number]  PCP:  Loreli Elsie JONETTA Mickey., MD  Cardiologist:   Eilis Chestnutt [place], MD   Chief Complaint  Patient presents with   Atrial Fibrillation     History of Present Illness: Alyssa Martin is a 77 y.o. female who presents for follow up Afib. She has a history of paroxysmal atrial fibrillation dating back to 2013.  She has been on cardizem  and Xarelto .    She was seen back in January and had Afib at that time. She did convert back to NSR. She states since that time she has increased Afib episodes occurring weekly and lasting for several hours. She is symptomatic with fatigue and some dizziness. No clear triggers. She states if she goes to sleep her Afib converts. With AFib her HR is typically 120-130. She uses a [employer] device to monitor.     Past Medical History:  Diagnosis Date   A-fib (HCC)    Arthritis    Cancer (HCC)    melanoma on arm   Closed fracture of left proximal humerus    Closed right ankle fracture    Elevated BP    GERD (gastroesophageal reflux disease)    Hyperlipidemia    Hypertension    Impaired fasting glucose    Osteopenia    Osteoporosis    Paroxysmal atrial fibrillation (HCC)    Tinnitus    Vitamin D deficiency    Wears glasses     Past Surgical History:  Procedure Laterality Date   COLONOSCOPY  05/10/2011   CYSTOSCOPY/URETEROSCOPY/HOLMIUM LASER/STENT PLACEMENT Left 08/30/2023   Procedure: CYSTOSCOPY, LEFT URETEROSCOPY, LEFT URETERAL STENT PLACEMENT;  Surgeon: Lovie Arlyss CROME, MD;  Location: [facility];  Service: Urology;  Laterality: Left;  60 MINUTES   DIAGNOSTIC MAMMOGRAM  10 /09/2010   ORIF HUMERUS FRACTURE Left 08/18/2017   Procedure: OPEN REDUCTION INTERNAL FIXATION (ORIF) LEFT PROXIMAL HUMERUS FRACTURE;  Surgeon: Addie Cordella Hamilton, MD;  Location: [facility];  Service: Orthopedics;  Laterality: Left;   TIBIA FRACTURE SURGERY  04/2006   Left--From falling  down the stairs   TIBIA FRACTURE SURGERY     In her 40's     Current Outpatient Medications  Medication Sig Dispense Refill   ABRYSVO 120 MCG/0.5ML injection Inject 0.5 mLs into the muscle.     BOOSTRIX 5-2.5-18.5 LF-MCG/0.5 injection Inject 0.5 mLs into the muscle once.     Calcium Citrate (CITRACAL PO) Take 630 mg by mouth 2 (two) times daily.     cetirizine (ZYRTEC) 10 MG tablet Take 10 mg by mouth daily.     diltiazem  (CARDIZEM  CD) 180 MG 24 hr capsule Take 180 mg by mouth daily.     fluticasone (FLONASE) 50 MCG/ACT nasal spray Place into both nostrils daily as needed for allergies or rhinitis.     mometasone (NASONEX) 50 MCG/ACT nasal spray Place 2 sprays into the nose daily as needed (allergies).     Multiple Vitamin (MULTIVITAMIN) tablet Take 1 tablet by mouth daily.     pantoprazole  (PROTONIX ) 40 MG tablet Take 40 mg by mouth daily.     simvastatin  (ZOCOR ) 20 MG tablet Take 20 mg by mouth every evening.     vitamin C (ASCORBIC ACID) 500 MG tablet Take 500 mg by mouth daily.     VITAMIN D PO Take 1 tablet by mouth daily at 6 (six) AM.     XARELTO  20 MG  TABS tablet TAKE 1 TABLET BY MOUTH DAILY WITH SUPPER 90 tablet 1   Coenzyme Q10 (COQ10) 200 MG CAPS Take 200 mg daily (Patient not taking: Reported on 03/26/2024)     ergocalciferol (VITAMIN D2) 50000 UNITS capsule Take 50,000 Units by mouth every 14 (fourteen) days. (Patient not taking: Reported on 03/26/2024)     Estradiol 10 MCG TABS vaginal tablet Place 1 tablet vaginally daily as needed (vaginal dryness). (Patient not taking: Reported on 03/26/2024)     methocarbamol  (ROBAXIN ) 750 MG tablet Take 750 mg by mouth at bedtime as needed for muscle spasms. (Patient not taking: Reported on 03/26/2024)     No current facility-administered medications for this visit.    Allergies:   Latex, Penicillins, and Ativan [lorazepam]    Social History:  The patient  reports that she has never smoked. She has never used smokeless tobacco. She reports  that she does not drink alcohol  and does not use drugs.   Family History:  The patient's family history includes Bone cancer in her brother and father; Hypertension in her mother; Leukemia in her paternal grandfather; Lung cancer in her brother and paternal grandmother; Osteoarthritis in her mother; Osteopenia in her father.    ROS:  Please see the history of present illness.   Otherwise, review of systems are positive for none.   All other systems are reviewed and negative.    PHYSICAL EXAM: VS:  BP 120/60   Pulse 65   Ht 4' 11 (1.499 m)   Wt 128 lb (58.1 kg)   SpO2 99%   BMI 25.85 kg/m  , BMI Body mass index is 25.85 kg/m. GEN: Well nourished, well developed, in no acute distress  HEENT: normal  Neck: no JVD, carotid bruits, or masses Cardiac: IRRR; no murmurs, rubs, or gallops,no edema  Respiratory:  clear to auscultation bilaterally, normal work of breathing GI: soft, nontender, nondistended, + BS MS: no deformity or atrophy  Skin: warm and dry, no rash Neuro:  Strength and sensation are intact Psych: euthymic mood, full affect        Recent Labs: 08/19/2023: ALT 18; BUN 24; Creatinine, Ser 0.78; Hemoglobin 13.5; Platelets 244; Potassium 4.0; Sodium 141    Lipid Panel No results found for: CHOL, TRIG, HDL, CHOLHDL, VLDL, LDLCALC, LDLDIRECT   Dated 07/17/19: cholesterol 161, triglycerides 83, HDL 65, LDL79. A1c 5.5%. normal CBC and TSH Dated 11/22/19: normal CMET. Dated 08/19/21: cholesterol 155, triglycerides 82, HDL 53, LDL 86. A1c 5.5%. CMET and TSH normal. Dated 08/20/22: cholesterol 157, triglycerides 91, HDL 64, LDL 75. A1c 5.3%. CMET and TSH normal Dated 09/12/23: cholesterol 157, triglycerides 85, HDL 61, LDL 79. A1c 5.6%. CBC, CMET and TSH normal.   Wt Readings from Last 3 Encounters:  03/26/24 128 lb (58.1 kg)  10/13/23 123 lb (55.8 kg)  09/26/23 128 lb (58.1 kg)      ASSESSMENT AND PLAN:  1. Paroxysmal atrial fibrillation. She is having  increased frequency and duration of symptoms.  On Cardizem  for rate control. On Xarelto  for anticoagulation. Given her symptoms I think we need a different approach. We discussed antiarrhythmic drug therapy versus ablation. She has been reading a lot about ablation and is more in favor of this. Will arrange appt with EP. Also schedule for Echo.      Current medicines are reviewed at length with the patient today.  The patient does not have concerns regarding medicines.  The following changes have been made:  no change  Labs/ tests  ordered today include:   Orders Placed This Encounter  Procedures   Ambulatory referral to Cardiac Electrophysiology   ECHOCARDIOGRAM COMPLETE      Disposition: FU with me in 6 months   Signed, Amyjo Mizrachi [place], MD  03/26/2024 9:40 AM    [facility] [address] [place], [place], [address] Phone [phone], Fax [phone]

## 2024-03-26 ENCOUNTER — Encounter: Payer: Self-pay | Admitting: Cardiology

## 2024-03-26 ENCOUNTER — Ambulatory Visit: Attending: Cardiology | Admitting: Cardiology

## 2024-03-26 VITALS — BP 120/60 | HR 65 | Ht 59.0 in | Wt 128.0 lb

## 2024-03-26 DIAGNOSIS — I480 Paroxysmal atrial fibrillation: Secondary | ICD-10-CM

## 2024-03-26 NOTE — Patient Instructions (Signed)
 Medication Instructions:  Continue same medications *If you need a refill on your cardiac medications before your next appointment, please call your pharmacy*  Lab Work: None ordered  Testing/Procedures: Echo   First available  Follow-Up: At [facility], you and your health needs are our priority.  As part of our continuing mission to provide you with exceptional heart care, our providers are all part of one team.  This team includes your primary Cardiologist (physician) and Advanced Practice Providers or APPs (Physician Assistants and Nurse Practitioners) who all work together to provide you with the care you need, when you need it.  Your next appointment:  6 months    Call in Sept to schedule Jan appointment     Provider:  Dr.Jordan     Schedule appointment with EP to discuss AFib ablation  First available    We recommend signing up for the patient portal called MyChart.  Sign up information is provided on this After Visit Summary.  MyChart is used to connect with patients for Virtual Visits (Telemedicine).  Patients are able to view lab/test results, encounter notes, upcoming appointments, etc.  Non-urgent messages can be sent to your provider as well.   To learn more about what you can do with MyChart, go to [email or web address].

## 2024-04-04 ENCOUNTER — Other Ambulatory Visit (INDEPENDENT_AMBULATORY_CARE_PROVIDER_SITE_OTHER): Payer: Self-pay

## 2024-04-04 ENCOUNTER — Ambulatory Visit: Admitting: Orthopaedic Surgery

## 2024-04-04 DIAGNOSIS — M7061 Trochanteric bursitis, right hip: Secondary | ICD-10-CM

## 2024-04-04 MED ORDER — LIDOCAINE HCL 1 % IJ SOLN
3.0000 mL | INTRAMUSCULAR | Status: AC | PRN
  Administered 2024-04-04: 3 mL

## 2024-04-04 MED ORDER — METHYLPREDNISOLONE ACETATE 40 MG/ML IJ SUSP
40.0000 mg | INTRAMUSCULAR | Status: AC | PRN
  Administered 2024-04-04: 40 mg via INTRA_ARTICULAR

## 2024-04-04 NOTE — Progress Notes (Signed)
 The patient is well-known to us .  She comes in today with right hip pain has been getting worse for a while with no known injury.  She does exercise at [employer] and participates in water aerobics.  She has had hip bursitis in the past and feels like this may be the same thing that she has had in the past.  It does wake her up at night when she rolls on that side and she points to the lateral aspect of her right hip as a source of her pain.  She denies any groin pain.  Examination of her right hip shows skin normal and fluid range of motion with no blocks to rotation no pain in the groin at all.  There is pain to palpation over the right hip trochanteric area down the IT band.  An AP pelvis along the right hip shows normal-appearing hips bilaterally.  Her signs and symptoms are consistent with trochanteric bursitis around the right hip.  She has had an injection in the past that has helped so I recommend another steroid injection today which she agreed to and tolerated well.  She is on blood thinning medications that she cannot take anti-inflammatories.  Voltaren gel does have a better safety profile for patients like her and I recommended she apply Voltaren gel to this area at least twice daily.  I have also shown her stretching exercises to try.  All question concerns were answered and addressed.  Follow-up can be as needed.  She can always have a repeat injection in 3 months if needed.    Procedure Note  Patient: Alyssa Martin             Date of Birth: 1945/09/30           MRN: [id number]             Visit Date: 04/04/2024  Procedures: Visit Diagnoses:  1. Trochanteric bursitis, right hip     Large Joint Inj: L greater trochanter on 04/04/2024 4:28 PM Indications: pain and diagnostic evaluation Details: 22 G 1.5 in needle, lateral approach  Arthrogram: No  Medications: 3 mL lidocaine  1 %; 40 mg methylPREDNISolone  acetate 40 MG/ML Outcome: tolerated well, no immediate  complications Procedure, treatment alternatives, risks and benefits explained, specific risks discussed. Consent was given by the patient. Immediately prior to procedure a time out was called to verify the correct patient, procedure, equipment, support staff and site/side marked as required. Patient was prepped and draped in the usual sterile fashion.

## 2024-04-06 ENCOUNTER — Ambulatory Visit (HOSPITAL_COMMUNITY)
Admission: RE | Admit: 2024-04-06 | Discharge: 2024-04-06 | Disposition: A | Discharge status: Home / Self Care | Source: Ambulatory Visit | Attending: Cardiology | Admitting: Cardiology

## 2024-04-06 DIAGNOSIS — I480 Paroxysmal atrial fibrillation: Secondary | ICD-10-CM

## 2024-04-06 LAB — ECHOCARDIOGRAM COMPLETE
AR max vel: 1.67 cm2
AV Peak grad: 8.6 mmHg
Ao pk vel: 1.47 m/s
Area-P 1/2: 2.99 cm2
MV M vel: 4.43 m/s
MV Peak grad: 78.5 mmHg
S' Lateral: 2.3 cm

## 2024-04-07 ENCOUNTER — Ambulatory Visit: Payer: Self-pay | Admitting: Cardiology

## 2024-04-12 ENCOUNTER — Ambulatory Visit (INDEPENDENT_AMBULATORY_CARE_PROVIDER_SITE_OTHER): Payer: PPO | Admitting: Otolaryngology

## 2024-04-12 ENCOUNTER — Encounter (INDEPENDENT_AMBULATORY_CARE_PROVIDER_SITE_OTHER): Payer: Self-pay | Admitting: Otolaryngology

## 2024-04-12 VITALS — BP 129/75 | HR 62

## 2024-04-12 DIAGNOSIS — H6123 Impacted cerumen, bilateral: Secondary | ICD-10-CM

## 2024-04-12 DIAGNOSIS — H8102 Meniere's disease, left ear: Secondary | ICD-10-CM

## 2024-04-12 DIAGNOSIS — H9042 Sensorineural hearing loss, unilateral, left ear, with unrestricted hearing on the contralateral side: Secondary | ICD-10-CM

## 2024-04-12 DIAGNOSIS — H9312 Tinnitus, left ear: Secondary | ICD-10-CM | POA: Diagnosis not present

## 2024-04-12 DIAGNOSIS — R42 Dizziness and giddiness: Secondary | ICD-10-CM

## 2024-04-13 NOTE — Progress Notes (Signed)
 Patient ID: Alyssa Martin, female   DOB: [birth year], 78 y.o.   MRN: [id number]  Follow-up: Recurrent dizziness, left ear hearing loss, left ear tinnitus  HPI: The patient is a 78 year old female who returns today for follow-up evaluation.  The patient has a history of recurrent dizziness, asymmetric left ear hearing loss, and left ear tinnitus.  Her MRI scan was negative for retrocochlear lesion.  Her symptoms were consistent with left ear Mnire's disease.  She was treated with a 1500 mg low-salt diet.  She was also fitted with a left ear hearing aid.  The patient returns today reporting occasional mild dizziness.  She has not noted any significant spinning vertigo.  She continues to have left ear tinnitus.  She denies any recent change in her hearing.  Exam: General: Communicates without difficulty, well nourished, no acute distress. Head: Normocephalic, no evidence injury, no tenderness, facial buttresses intact without stepoff. Face/sinus: No tenderness to palpation and percussion. Facial movement is normal and symmetric. Eyes: PERRL, EOMI. No scleral icterus, conjunctivae clear. Neuro: CN II exam reveals vision grossly intact.  No nystagmus at any point of gaze. Ears: Auricles well formed without lesions.  Bilateral cerumen impaction.  Nose: External evaluation reveals normal support and skin without lesions.  Dorsum is intact.  Anterior rhinoscopy reveals congested mucosa over anterior aspect of inferior turbinates and intact septum.  No purulence noted. Oral:  Oral cavity and oropharynx are intact, symmetric, without erythema or edema.  Mucosa is moist without lesions. Neck: Full range of motion without pain.  There is no significant lymphadenopathy.  No masses palpable.  Thyroid  bed within normal limits to palpation.  Parotid glands and submandibular glands equal bilaterally without mass.  Trachea is midline. Neuro:  CN 2-12 grossly intact. Vestibular: No nystagmus at any point of gaze.   Vestibular: There is no nystagmus with pneumatic pressure on either tympanic membrane or Valsalva. The cerebellar examination is unremarkable.     Procedure: Bilateral cerumen disimpaction Anesthesia: None Description: Under the operating microscope, the cerumen is carefully removed with a combination of cerumen currette, alligator forceps, and suction catheters.  After the cerumen is removed, the TMs are noted to be normal.  No mass, erythema, or lesions. The patient tolerated the procedure well.     Assessment: 1.  Incidental finding of bilateral cerumen impaction.  After the disimpaction procedure, her tympanic membranes and middle ear spaces are noted to be normal. 2.  Subjectively stable asymmetric left ear sensorineural hearing loss and tinnitus.  Her MRI scan was negative. 3.  Recurrent dizziness, likely secondary to her left ear Mnire's disease.  Her symptoms are currently under control with a low-salt diet.  Plan: 1.  Otomicroscopy with bilateral cerumen disimpaction. 2.  The physical exam findings are reviewed with the patient. 3.  The pathophysiology and clinical course of Mnire's disease are again reviewed with the patient.  Questions are invited and answered. 4.  The strategies to cope with tinnitus, including the use of masker, hearing aids, tinnitus retraining therapy, and avoidance of caffeine and alcohol  are discussed.  5.  Continue with a 1500 mg low-salt diet. 6.  The patient will return for reevaluation in 6 months, sooner if needed.

## 2024-04-24 ENCOUNTER — Encounter: Payer: Self-pay | Admitting: Cardiology

## 2024-04-24 ENCOUNTER — Ambulatory Visit: Attending: Cardiovascular Disease | Admitting: Cardiology

## 2024-04-24 VITALS — BP 134/66 | HR 57 | Ht 59.5 in | Wt 128.5 lb

## 2024-04-24 DIAGNOSIS — D6869 Other thrombophilia: Secondary | ICD-10-CM | POA: Diagnosis not present

## 2024-04-24 DIAGNOSIS — I480 Paroxysmal atrial fibrillation: Secondary | ICD-10-CM

## 2024-04-24 DIAGNOSIS — I10 Essential (primary) hypertension: Secondary | ICD-10-CM | POA: Diagnosis not present

## 2024-04-24 NOTE — Patient Instructions (Addendum)
 Medication Instructions:  Your physician recommends that you continue on your current medications as directed. Please refer to the Current Medication list given to you today.  *If you need a refill on your cardiac medications before your next appointment, please call your pharmacy*  Testing/Procedures: Ablation Your physician has recommended that you have an ablation. Catheter ablation is a medical procedure used to treat some cardiac arrhythmias (irregular heartbeats). During catheter ablation, a long, thin, flexible tube is put into a blood vessel in your groin (upper thigh), or neck. This tube is called an ablation catheter. It is then guided to your heart through the blood vessel. Radio frequency waves destroy small areas of heart tissue where abnormal heartbeats may cause an arrhythmia to start.   What To Expect:  Labs: you will need to have lab work drawn within 30 days of your procedure. Please go to any [facility] location to have these drawn - no appointment is needed. You will receive procedure instructions either through MyChart or in the mail 4-6 week prior to your procedure.  After your procedure we recommend no driving for 3 days, no lifting over 10 lbs for 5 days, and no work or strenuous activity for 7 days.  Please contact our office at [phone] if you have any questions.    We will call you to schedule your ablation

## 2024-04-24 NOTE — Progress Notes (Unsigned)
 Electrophysiology Office Note:   Date:  04/25/2024  ID:  Alyssa Martin, Alyssa Martin Dec 30, 1945, MRN [id number]  Primary Cardiologist: Peter [place], MD Electrophysiologist: Fonda Kitty, MD      History of Present Illness:   Alyssa Martin is a 78 y.o. female with h/o paroxysmal atrial fibrillation, hyperlipidemia who is being seen today for evaluation for catheter ablation at the request of Dr. [place].  Discussed the use of AI scribe software for clinical note transcription with the patient, who gave verbal consent to proceed.  She experiences episodes of atrial fibrillation characterized by noticeable fluctuations in heart rate, extreme fatigue, nausea, and a general feeling of unwellness. These episodes have been increasingly frequent and prolonged over the past seven years, significantly impacting her quality of life and reducing her activity levels.  An echocardiogram indicated normal heart pumping function, normal ejection fraction, and normal right ventricle size. The left atrium is slightly dilated, which is a common finding in individuals with atrial fibrillation.  Her current medication regimen includes Xarelto , a blood thinner, which she has been on since her initial atrial fibrillation diagnosis.  Family history reveals that atrial fibrillation is present among her siblings and extended family members, suggesting a familial predisposition to the condition.  Review of systems complete and found to be negative unless listed in HPI.   EP Information / Studies Reviewed:    EKG is ordered today. Personal review as below.  EKG Interpretation Date/Time:  Tuesday April 24 2024 13:46:44 EDT Ventricular Rate:  57 PR Interval:  162 QRS Duration:  66 QT Interval:  414 QTC Calculation: 402 R Axis:   48  Text Interpretation: Sinus bradycardia Low voltage QRS When compared with ECG of 26-Sep-2023 14:28, Sinus rhythm has replaced Atrial fibrillation Vent. rate has decreased BY  72 BPM  Confirmed by Kitty Fonda [phone]) on 04/24/2024 1:53:46 PM  EKG 09/26/23: AF   Echo 04/06/24:  1. Left ventricular ejection fraction, by estimation, is 60 to 65%. The  left ventricle has normal function. The left ventricle has no regional  wall motion abnormalities. Left ventricular diastolic parameters are  consistent with Grade I diastolic  dysfunction (impaired relaxation).   2. Right ventricular systolic function is normal. The right ventricular  size is normal.   3. Left atrial size was moderately dilated.   4. The mitral valve is normal in structure. Mild mitral valve  regurgitation. No evidence of mitral stenosis.   5. The aortic valve is normal in structure. Aortic valve regurgitation is  trivial. No aortic stenosis is present.   6. The inferior vena cava is normal in size with greater than 50%  respiratory variability, suggesting right atrial pressure of 3 mmHg.   Risk Assessment/Calculations:    CHA2DS2-VASc Score = 4   This indicates a 4.8% annual risk of stroke. The patient's score is based upon: CHF History: 0 HTN History: 1 Diabetes History: 0 Stroke History: 0 Vascular Disease History: 0 Age Score: 2 Gender Score: 1             Physical Exam:   VS:  BP 134/66   Pulse (!) 57   Ht 4' 11.5 (1.511 m)   Wt 128 lb 8 oz (58.3 kg)   SpO2 97%   BMI 25.52 kg/m    Wt Readings from Last 3 Encounters:  04/24/24 128 lb 8 oz (58.3 kg)  03/26/24 128 lb (58.1 kg)  10/13/23 123 lb (55.8 kg)     GEN: Well nourished, well developed in  no acute distress NECK: No JVD CARDIAC: Bradycardic, regular RESPIRATORY:  Clear to auscultation without rales, wheezing or rhonchi  ABDOMEN: Soft, non-distended EXTREMITIES:  No edema; No deformity   ASSESSMENT AND PLAN:    #. Paroxysmal atrial fibrillation, symptomatic:  #. Hypercoagulable state due to AF:  -Discussed treatment options today for AF including antiarrhythmic drug therapy and ablation. Discussed risks, recovery and  likelihood of success with each treatment strategy. Risk, benefits, and alternatives to EP study and ablation for afib were discussed. These risks include but are not limited to stroke, bleeding, vascular damage, tamponade, perforation, damage to the esophagus, lungs, phrenic nerve and other structures, pulmonary vein stenosis, worsening renal function, coronary vasospasm and death.  Discussed potential need for repeat ablation procedures and antiarrhythmic drugs after an initial ablation. The patient understands these risk and wishes to proceed.  We will therefore proceed with catheter ablation at the next available time.  Carto, ICE, anesthesia are requested for the procedure.   We will not obtain CT PV protocol prior to the procedure. - Continue diltiazem  180 mg once daily. - Continue rivaroxaban  20 mg daily.  #Hypertension -At goal today.  Recommend checking blood pressures 1-2 times per week at home and recording the values.  Recommend bringing these recordings to the primary care physician.  Follow up with Dr. Kennyth 3 months after ablation.   Signed, Fonda Kennyth, MD

## 2024-05-01 ENCOUNTER — Telehealth: Payer: Self-pay

## 2024-05-01 NOTE — Telephone Encounter (Signed)
 Spoke with the patient and scheduled her for an ablation with Dr. Kennyth on 10/27

## 2024-05-01 NOTE — Telephone Encounter (Signed)
 Pt returning nurses call regarding Ablation. Please advise

## 2024-05-01 NOTE — Telephone Encounter (Signed)
 Left message for patient to call back to schedule an ablation with Dr. Kennyth.

## 2024-05-09 DIAGNOSIS — N898 Other specified noninflammatory disorders of vagina: Secondary | ICD-10-CM | POA: Diagnosis not present

## 2024-05-09 DIAGNOSIS — R300 Dysuria: Secondary | ICD-10-CM | POA: Diagnosis not present

## 2024-06-22 ENCOUNTER — Encounter (HOSPITAL_COMMUNITY): Payer: Self-pay

## 2024-06-22 ENCOUNTER — Telehealth (HOSPITAL_COMMUNITY): Payer: Self-pay

## 2024-06-22 NOTE — Telephone Encounter (Signed)
 Spoke with patient to complete pre-procedure call.     Health status review:  Any new medical conditions, recent signs of acute illness or been started on antibiotics? No Any recent hospitalizations or surgeries? No Any new medications started since pre-op visit? No  Follow all medication instructions prior to procedure or the procedure may be rescheduled:    Continue taking Xarelto  (Rivaroxaban ) once daily without missing any doses before procedure. Essential chronic medications:  No medication should be continued, unless told otherwise. On the morning of your procedure DO NOT take any medication., including Xarelto  (Rivaroxaban ).  Nothing to eat or drink after midnight prior to your procedure.  Pre-procedure testing scheduled: lab work by October 13.  Confirmed patient is scheduled for Atrial Fibrillation Ablation on Monday, October 27 with Dr. Sidra Kitty. Instructed patient to arrive at the Main Entrance A at [facility]: [address] [place], [place] [address] and check in at Admitting at 5:30 AM.  Advised of plan to go home the same day and will only stay overnight if medically necessary. You MUST have a responsible adult to drive you home and MUST be with you the first 24 hours after you arrive home or your procedure could be cancelled.  Informed patient a nurse will call a day before the procedure to confirm arrival time and ensure instructions are followed.  Patient verbalized understanding to information provided and is agreeable to proceed with procedure.   Advised patient to contact RN Navigator at [phone], to inform of any new medications started after call or concerns prior to procedure.

## 2024-06-25 DIAGNOSIS — D2262 Melanocytic nevi of left upper limb, including shoulder: Secondary | ICD-10-CM | POA: Diagnosis not present

## 2024-06-25 DIAGNOSIS — L821 Other seborrheic keratosis: Secondary | ICD-10-CM | POA: Diagnosis not present

## 2024-06-25 DIAGNOSIS — D2261 Melanocytic nevi of right upper limb, including shoulder: Secondary | ICD-10-CM | POA: Diagnosis not present

## 2024-06-25 DIAGNOSIS — Z85820 Personal history of malignant melanoma of skin: Secondary | ICD-10-CM | POA: Diagnosis not present

## 2024-06-25 DIAGNOSIS — L738 Other specified follicular disorders: Secondary | ICD-10-CM | POA: Diagnosis not present

## 2024-06-25 DIAGNOSIS — D2239 Melanocytic nevi of other parts of face: Secondary | ICD-10-CM | POA: Diagnosis not present

## 2024-06-25 DIAGNOSIS — L905 Scar conditions and fibrosis of skin: Secondary | ICD-10-CM | POA: Diagnosis not present

## 2024-06-26 DIAGNOSIS — I480 Paroxysmal atrial fibrillation: Secondary | ICD-10-CM | POA: Diagnosis not present

## 2024-06-26 DIAGNOSIS — I10 Essential (primary) hypertension: Secondary | ICD-10-CM | POA: Diagnosis not present

## 2024-06-26 DIAGNOSIS — D6869 Other thrombophilia: Secondary | ICD-10-CM | POA: Diagnosis not present

## 2024-06-27 ENCOUNTER — Ambulatory Visit: Payer: Self-pay

## 2024-06-27 LAB — BASIC METABOLIC PANEL WITH GFR
BUN/Creatinine Ratio: 24 (ref 12–28)
BUN: 18 mg/dL (ref 8–27)
CO2: 23 mmol/L (ref 20–29)
Calcium: 9.9 mg/dL (ref 8.7–10.3)
Chloride: 104 mmol/L (ref 96–106)
Creatinine, Ser: 0.74 mg/dL (ref 0.57–1.00)
Glucose: 100 mg/dL — AB (ref 70–99)
Potassium: 4.5 mmol/L (ref 3.5–5.2)
Sodium: 142 mmol/L (ref 134–144)
eGFR: 83 mL/min/1.73 (ref >59)

## 2024-06-27 LAB — CBC
Hematocrit: 42.6 % (ref 34.0–46.6)
Hemoglobin: 13.9 g/dL (ref 11.1–15.9)
MCH: 29.8 pg (ref 26.6–33.0)
MCHC: 32.6 g/dL (ref 31.5–35.7)
MCV: 91 fL (ref 79–97)
Platelets: 258 x10E3/uL (ref 150–450)
RBC: 4.66 x10E6/uL (ref 3.77–5.28)
RDW: 13.2 % (ref 11.7–15.4)
WBC: 4.4 x10E3/uL (ref 3.4–10.8)

## 2024-07-02 ENCOUNTER — Telehealth: Payer: Self-pay | Admitting: Cardiology

## 2024-07-02 NOTE — Telephone Encounter (Signed)
 Patient is calling to see if she should get the covid shot. Please advise

## 2024-07-03 NOTE — Telephone Encounter (Signed)
 Spoke to patient Dr.Jordan's advice given.

## 2024-07-05 ENCOUNTER — Ambulatory Visit: Admitting: Orthopaedic Surgery

## 2024-07-09 ENCOUNTER — Encounter: Payer: Self-pay | Admitting: Orthopaedic Surgery

## 2024-07-09 ENCOUNTER — Ambulatory Visit: Admitting: Orthopaedic Surgery

## 2024-07-09 DIAGNOSIS — M7061 Trochanteric bursitis, right hip: Secondary | ICD-10-CM | POA: Diagnosis not present

## 2024-07-09 MED ORDER — LIDOCAINE HCL 1 % IJ SOLN
3.0000 mL | INTRAMUSCULAR | Status: AC | PRN
  Administered 2024-07-09: 3 mL

## 2024-07-09 MED ORDER — METHYLPREDNISOLONE ACETATE 40 MG/ML IJ SUSP
40.0000 mg | INTRAMUSCULAR | Status: AC | PRN
  Administered 2024-07-09: 40 mg via INTRA_ARTICULAR

## 2024-07-09 NOTE — Progress Notes (Signed)
 The patient is a 78 year old female well-known to us .  Just over 3 months ago we did place a steroid injection around her right hip trochanteric area for bursitis and she said that helped quite a bit as well as the stretching exercises.  She says it is a little sore now and we talked about having another injection in that area today since she is having an ablation next week for atrial fibrillation and will likely not be able to perform any stretching exercises or any activities on that side as she is healing from the ablation since they do go through the groin area.  She is not a diabetic either.  On exam she is still quite tender at the trochanteric area to palpation on the right side.  I did provide a steroid injection over this area which she tolerated very well.  Follow-up can be as needed.  She should at least wait till about a week after her ablation to start stretching exercises again for the trochanteric area.    Procedure Note  Patient: Alyssa Martin             Date of Birth: 1946-04-09           MRN: [id number]             Visit Date: 07/09/2024  Procedures: Visit Diagnoses:  1. Trochanteric bursitis, right hip     Large Joint Inj: R greater trochanter on 07/09/2024 3:41 PM Indications: pain and diagnostic evaluation Details: 22 G 1.5 in needle, lateral approach  Arthrogram: No  Medications: 3 mL lidocaine  1 %; 40 mg methylPREDNISolone  acetate 40 MG/ML Outcome: tolerated well, no immediate complications Procedure, treatment alternatives, risks and benefits explained, specific risks discussed. Consent was given by the patient. Immediately prior to procedure a time out was called to verify the correct patient, procedure, equipment, support staff and site/side marked as required. Patient was prepped and draped in the usual sterile fashion.

## 2024-07-13 NOTE — Pre-Procedure Instructions (Signed)
 Instructed patient on the following items: Arrival time 0515 Nothing to eat or drink after midnight No meds AM of procedure Responsible person to drive you home and stay with you for 24 hrs  Have you missed any doses of anti-coagulant Xarelto - takes once a day, hasn't missed any doses in last 4 weeks.

## 2024-07-16 ENCOUNTER — Ambulatory Visit (HOSPITAL_COMMUNITY)
Admission: RE | Disposition: A | Discharge status: Home / Self Care | Payer: Self-pay | Source: Home / Self Care | Attending: Cardiology

## 2024-07-16 ENCOUNTER — Encounter: Payer: Self-pay | Admitting: Emergency Medicine

## 2024-07-16 ENCOUNTER — Ambulatory Visit (HOSPITAL_BASED_OUTPATIENT_CLINIC_OR_DEPARTMENT_OTHER)

## 2024-07-16 ENCOUNTER — Ambulatory Visit (HOSPITAL_COMMUNITY)

## 2024-07-16 ENCOUNTER — Ambulatory Visit (HOSPITAL_COMMUNITY)
Admission: RE | Admit: 2024-07-16 | Discharge: 2024-07-16 | Disposition: A | Discharge status: Home / Self Care | Attending: Cardiology | Admitting: Cardiology

## 2024-07-16 ENCOUNTER — Other Ambulatory Visit: Payer: Self-pay

## 2024-07-16 DIAGNOSIS — D6869 Other thrombophilia: Secondary | ICD-10-CM | POA: Diagnosis not present

## 2024-07-16 DIAGNOSIS — Z79899 Other long term (current) drug therapy: Secondary | ICD-10-CM | POA: Insufficient documentation

## 2024-07-16 DIAGNOSIS — I480 Paroxysmal atrial fibrillation: Secondary | ICD-10-CM | POA: Insufficient documentation

## 2024-07-16 DIAGNOSIS — I10 Essential (primary) hypertension: Secondary | ICD-10-CM | POA: Insufficient documentation

## 2024-07-16 DIAGNOSIS — I499 Cardiac arrhythmia, unspecified: Secondary | ICD-10-CM | POA: Diagnosis not present

## 2024-07-16 DIAGNOSIS — K219 Gastro-esophageal reflux disease without esophagitis: Secondary | ICD-10-CM | POA: Diagnosis not present

## 2024-07-16 DIAGNOSIS — E785 Hyperlipidemia, unspecified: Secondary | ICD-10-CM | POA: Insufficient documentation

## 2024-07-16 DIAGNOSIS — Z7901 Long term (current) use of anticoagulants: Secondary | ICD-10-CM | POA: Diagnosis not present

## 2024-07-16 HISTORY — PX: ATRIAL FIBRILLATION ABLATION: EP1191

## 2024-07-16 LAB — POCT ACTIVATED CLOTTING TIME: Activated Clotting Time: 348 s

## 2024-07-16 MED ORDER — VANCOMYCIN HCL IN DEXTROSE 1-5 GM/200ML-% IV SOLN
1000.0000 mg | Freq: Once | INTRAVENOUS | Status: AC
  Administered 2024-07-16: 1000 mg via INTRAVENOUS

## 2024-07-16 MED ORDER — ONDANSETRON HCL 4 MG/2ML IJ SOLN
INTRAMUSCULAR | Status: DC
Start: 2024-07-16 — End: 2024-07-16
  Filled 2024-07-16: qty 2

## 2024-07-16 MED ORDER — HEPARIN (PORCINE) IN NACL 1000-0.9 UT/500ML-% IV SOLN
INTRAVENOUS | Status: DC | PRN
  Administered 2024-07-16 (×3): 500 mL

## 2024-07-16 MED ORDER — VANCOMYCIN HCL IN DEXTROSE 1-5 GM/200ML-% IV SOLN
INTRAVENOUS | Status: AC
  Filled 2024-07-16: qty 200

## 2024-07-16 MED ORDER — PHENYLEPHRINE HCL-NACL 20-0.9 MG/250ML-% IV SOLN
INTRAVENOUS | Status: AC
  Filled 2024-07-16: qty 500

## 2024-07-16 MED ORDER — HEPARIN SODIUM (PORCINE) 1000 UNIT/ML IJ SOLN
INTRAMUSCULAR | Status: AC
  Filled 2024-07-16: qty 20

## 2024-07-16 MED ORDER — PROPOFOL 1000 MG/100ML IV EMUL
INTRAVENOUS | Status: AC
  Filled 2024-07-16: qty 100

## 2024-07-16 MED ORDER — ACETAMINOPHEN 325 MG PO TABS: 650.0000 mg | ORAL_TABLET | ORAL | Status: DC | PRN

## 2024-07-16 MED ORDER — ONDANSETRON HCL 4 MG/2ML IJ SOLN
4.0000 mg | Freq: Once | INTRAMUSCULAR | Status: AC | PRN
  Administered 2024-07-16: 4 mg via INTRAVENOUS

## 2024-07-16 MED ORDER — SODIUM CHLORIDE 0.9% FLUSH: 3.0000 mL | Freq: Two times a day (BID) | INTRAVENOUS | Status: DC

## 2024-07-16 MED ORDER — PHENYLEPHRINE 80 MCG/ML (10ML) SYRINGE FOR IV PUSH (FOR BLOOD PRESSURE SUPPORT)
PREFILLED_SYRINGE | INTRAVENOUS | Status: DC | PRN
  Administered 2024-07-16: 40 ug via INTRAVENOUS

## 2024-07-16 MED ORDER — SUGAMMADEX SODIUM 200 MG/2ML IV SOLN
INTRAVENOUS | Status: DC | PRN
  Administered 2024-07-16: 120 mg via INTRAVENOUS

## 2024-07-16 MED ORDER — FENTANYL CITRATE (PF) 100 MCG/2ML IJ SOLN
INTRAMUSCULAR | Status: AC
  Filled 2024-07-16: qty 2

## 2024-07-16 MED ORDER — SODIUM CHLORIDE 0.9 % IV SOLN
INTRAVENOUS | Status: DC
  Administered 2024-07-16: 250 mL via INTRAVENOUS

## 2024-07-16 MED ORDER — ONDANSETRON HCL 4 MG/2ML IJ SOLN
INTRAMUSCULAR | Status: DC | PRN
  Administered 2024-07-16: 4 mg via INTRAVENOUS

## 2024-07-16 MED ORDER — PROPOFOL 10 MG/ML IV BOLUS
INTRAVENOUS | Status: DC | PRN
  Administered 2024-07-16: 80 mg via INTRAVENOUS

## 2024-07-16 MED ORDER — ROCURONIUM BROMIDE 10 MG/ML (PF) SYRINGE
PREFILLED_SYRINGE | INTRAVENOUS | Status: DC | PRN
  Administered 2024-07-16: 20 mg via INTRAVENOUS
  Administered 2024-07-16: 50 mg via INTRAVENOUS

## 2024-07-16 MED ORDER — PHENYLEPHRINE HCL-NACL 20-0.9 MG/250ML-% IV SOLN
INTRAVENOUS | Status: DC | PRN
Start: 2024-07-16 — End: 2024-07-16
  Administered 2024-07-16: 40 ug/min via INTRAVENOUS

## 2024-07-16 MED ORDER — LIDOCAINE 2% (20 MG/ML) 5 ML SYRINGE
INTRAMUSCULAR | Status: DC | PRN
  Administered 2024-07-16: 60 mg via INTRAVENOUS

## 2024-07-16 MED ORDER — SODIUM CHLORIDE 0.9 % IV SOLN: 250.0000 mL | INTRAVENOUS | Status: DC | PRN

## 2024-07-16 MED ORDER — RIVAROXABAN 20 MG PO TABS
20.0000 mg | ORAL_TABLET | Freq: Once | ORAL | Status: AC
  Administered 2024-07-16: 20 mg via ORAL
  Filled 2024-07-16: qty 1

## 2024-07-16 MED ORDER — ATROPINE SULFATE 1 MG/10ML IJ SOSY
PREFILLED_SYRINGE | INTRAMUSCULAR | Status: AC
Start: 2024-07-16 — End: 2024-07-16
  Filled 2024-07-16: qty 10

## 2024-07-16 MED ORDER — ATROPINE SULFATE 1 MG/ML IV SOLN
INTRAVENOUS | Status: DC | PRN
  Administered 2024-07-16: 1 mg via INTRAVENOUS

## 2024-07-16 MED ORDER — PROTAMINE SULFATE 10 MG/ML IV SOLN
INTRAVENOUS | Status: DC | PRN
  Administered 2024-07-16: 10 mg via INTRAVENOUS
  Administered 2024-07-16: 15 mg via INTRAVENOUS
  Administered 2024-07-16: 10 mg via INTRAVENOUS

## 2024-07-16 MED ORDER — FENTANYL CITRATE (PF) 250 MCG/5ML IJ SOLN
INTRAMUSCULAR | Status: DC | PRN
  Administered 2024-07-16: 50 ug via INTRAVENOUS

## 2024-07-16 MED ORDER — HEPARIN SODIUM (PORCINE) 1000 UNIT/ML IJ SOLN
INTRAMUSCULAR | Status: DC | PRN
  Administered 2024-07-16: 10000 [IU] via INTRAVENOUS

## 2024-07-16 MED ORDER — ONDANSETRON HCL 4 MG/2ML IJ SOLN: 4.0000 mg | Freq: Four times a day (QID) | INTRAMUSCULAR | Status: DC | PRN

## 2024-07-16 MED ORDER — DEXAMETHASONE SOD PHOSPHATE PF 10 MG/ML IJ SOLN
INTRAMUSCULAR | Status: DC | PRN
  Administered 2024-07-16: 5 mg via INTRAVENOUS

## 2024-07-16 MED ORDER — SODIUM CHLORIDE 0.9% FLUSH: 3.0000 mL | INTRAVENOUS | Status: DC | PRN

## 2024-07-16 NOTE — Discharge Instructions (Signed)

## 2024-07-16 NOTE — Anesthesia Postprocedure Evaluation (Signed)
 Anesthesia Post Note  Patient: Alyssa Martin  Procedure(s) Performed: ATRIAL FIBRILLATION ABLATION     Patient location during evaluation: Short Stay Anesthesia Type: General Level of consciousness: awake and alert Pain management: pain level controlled Vital Signs Assessment: post-procedure vital signs reviewed and stable Respiratory status: spontaneous breathing Cardiovascular status: blood pressure returned to baseline Postop Assessment: no apparent nausea or vomiting Anesthetic complications: no   No notable events documented.               Lauraine KATHEE [place]

## 2024-07-16 NOTE — Progress Notes (Signed)
 Patient ambulated to the bathroom. No bleeding or hematoma noted to bilateral groin sites. Dressings clean, dry, and intact.

## 2024-07-16 NOTE — Progress Notes (Signed)
 Patient and daughter, Rosaline Sharps called to the unit at 1925 and stated that the patient had a small amount of bleeding from her groin site. I informed them that the patient needs to lay flat and they needed to hold pressure and call 911. They informed me that it was a small amount of  blood. I informed them that I was not there to verify this for myself and that they needed to hold pressure and call 911. They agreed and the call was disconnected.

## 2024-07-16 NOTE — H&P (Signed)
 Electrophysiology Note:   Date:  07/16/24  ID:  Alyssa Martin, DOB 09-Sep-1946, MRN [id number]   Primary Cardiologist: Peter Jordan, MD Electrophysiologist: Fonda Kitty, MD       History of Present Illness:   Alyssa Martin is a 78 y.o. female with h/o paroxysmal atrial fibrillation, hyperlipidemia who is being seen today for evaluation for catheter ablation at the request of Dr. Jordan.   Discussed the use of AI scribe software for clinical note transcription with the patient, who gave verbal consent to proceed.   She experiences episodes of atrial fibrillation characterized by noticeable fluctuations in heart rate, extreme fatigue, nausea, and a general feeling of unwellness. These episodes have been increasingly frequent and prolonged over the past seven years, significantly impacting her quality of life and reducing her activity levels.   An echocardiogram indicated normal heart pumping function, normal ejection fraction, and normal right ventricle size. The left atrium is slightly dilated, which is a common finding in individuals with atrial fibrillation.   Her current medication regimen includes Xarelto , a blood thinner, which she has been on since her initial atrial fibrillation diagnosis.   Family history reveals that atrial fibrillation is present among her siblings and extended family members, suggesting a familial predisposition to the condition.  Interval: Patient presents today for planned ablation. Reports feeling relatively well. No new or acute complaints.   Review of systems complete and found to be negative unless listed in HPI.    EP Information / Studies Reviewed:      EKG Interpretation Date/Time:                  Tuesday April 24 2024 13:46:44 EDT Ventricular Rate:         57 PR Interval:                 162 QRS Duration:             66 QT Interval:                 414 QTC Calculation:402 R Axis:                         48   Text Interpretation:       Sinus bradycardia Low voltage QRS When compared with ECG of 26-Sep-2023 14:28, Sinus rhythm has replaced Atrial fibrillation Vent. rate has decreased BY  72 BPM Confirmed by Kitty Fonda [phone]) on 04/24/2024 1:53:46 PM  EKG 09/26/23: AF    Echo 04/06/24:  1. Left ventricular ejection fraction, by estimation, is 60 to 65%. The  left ventricle has normal function. The left ventricle has no regional  wall motion abnormalities. Left ventricular diastolic parameters are  consistent with Grade I diastolic  dysfunction (impaired relaxation).   2. Right ventricular systolic function is normal. The right ventricular  size is normal.   3. Left atrial size was moderately dilated.   4. The mitral valve is normal in structure. Mild mitral valve  regurgitation. No evidence of mitral stenosis.   5. The aortic valve is normal in structure. Aortic valve regurgitation is  trivial. No aortic stenosis is present.   6. The inferior vena cava is normal in size with greater than 50%  respiratory variability, suggesting right atrial pressure of 3 mmHg.    Risk Assessment/Calculations:     CHA2DS2-VASc Score = 4   This indicates a 4.8% annual risk of stroke. The patient's score is  based upon: CHF History: 0 HTN History: 1 Diabetes History: 0 Stroke History: 0 Vascular Disease History: 0 Age Score: 2 Gender Score: 1               Physical Exam:    Today's Vitals   07/16/24 0636  BP: 128/81  Pulse: 66  Resp: 18  Temp: 97.6 F (36.4 C)  TempSrc: Oral  Weight: 54.4 kg  Height: 4' 11 (1.499 m)  PainSc: 0-No pain   Body mass index is 24.24 kg/m.   GEN: Well nourished, well developed in no acute distress NECK: No JVD CARDIAC: Normal rate, regular RESPIRATORY:  Clear to auscultation without rales, wheezing or rhonchi  ABDOMEN: Soft, non-distended EXTREMITIES:  No edema; No deformity    ASSESSMENT AND PLAN:     #. Paroxysmal atrial fibrillation, symptomatic:  #. Hypercoagulable state due  to AF:  -Discussed treatment options today for AF including antiarrhythmic drug therapy and ablation. Discussed risks, recovery and likelihood of success with each treatment strategy. Risk, benefits, and alternatives to EP study and ablation for afib were discussed. These risks include but are not limited to stroke, bleeding, vascular damage, tamponade, perforation, damage to the esophagus, lungs, phrenic nerve and other structures, pulmonary vein stenosis, worsening renal function, coronary vasospasm and death.  Discussed potential need for repeat ablation procedures and antiarrhythmic drugs after an initial ablation. The patient understands these risk and wishes to proceed today.  - Continue diltiazem  180 mg once daily. - Continue rivaroxaban  20 mg daily.    Follow up with Dr. Kennyth 3 months after ablation.    Signed, Fonda Kennyth, MD

## 2024-07-16 NOTE — Progress Notes (Signed)
 Patient ambulated in the hall. Blood pressure rechecked and recorded. Patient alert and oriented. OK to discharge per the provider. Bilateral groin sites clean, dry, and intact. No bleeding or hematoma noted. Assisted patient with getting dressed. No change to sites. Patient to discharge at 1500.

## 2024-07-16 NOTE — Anesthesia Procedure Notes (Signed)
 Procedure Name: Intubation Date/Time: 07/16/2024 7:41 AM  Performed by: Worth Catherene Flores, CRNAPre-anesthesia Checklist: Patient identified, Emergency Drugs available, Suction available and Patient being monitored Patient Re-evaluated:Patient Re-evaluated prior to induction Oxygen Delivery Method: Circle system utilized Preoxygenation: Pre-oxygenation with 100% oxygen Induction Type: IV induction Ventilation: Mask ventilation without difficulty Laryngoscope Size: Mac and 3 Grade View: Grade I Tube type: Oral Tube size: 7.0 mm Number of attempts: 1 Airway Equipment and Method: Stylet and Oral airway Placement Confirmation: ETT inserted through vocal cords under direct vision, positive ETCO2 and breath sounds checked- equal and bilateral Secured at: 20 cm Tube secured with: Tape Dental Injury: Teeth and Oropharynx as per pre-operative assessment

## 2024-07-16 NOTE — Anesthesia Preprocedure Evaluation (Addendum)
 Anesthesia Evaluation  Patient identified by MRN, date of birth, ID band Patient awake    Reviewed: Allergy & Precautions, NPO status , Patient's Chart, lab work & pertinent test results  Airway Mallampati: II  TM Distance: >3 FB Neck ROM: Full    Dental  (+) Teeth Intact, Dental Advisory Given   Pulmonary neg COPD   breath sounds clear to auscultation       Cardiovascular hypertension, + dysrhythmias Atrial Fibrillation  Rhythm:Irregular Rate:Normal  03/2024: 1. Left ventricular ejection fraction, by estimation, is 60 to 65%. The  left ventricle has normal function. The left ventricle has no regional  wall motion abnormalities. Left ventricular diastolic parameters are  consistent with Grade I diastolic  dysfunction (impaired relaxation).   2. Right ventricular systolic function is normal. The right ventricular  size is normal.   3. Left atrial size was moderately dilated.   4. The mitral valve is normal in structure. Mild mitral valve  regurgitation. No evidence of mitral stenosis.   5. The aortic valve is normal in structure. Aortic valve regurgitation is  trivial. No aortic stenosis is present.   6. The inferior vena cava is normal in size with greater than 50%  respiratory variability, suggesting right atrial pressure of 3 mmHg.     Neuro/Psych Sensorineural hearing loss; Meniere's disease    GI/Hepatic ,GERD  Medicated,,  Endo/Other  neg diabetes    Renal/GU      Musculoskeletal  (+) Arthritis ,    Abdominal   Peds  Hematology negative hematology ROS (+)   Anesthesia Other Findings   Reproductive/Obstetrics                              Anesthesia Physical Anesthesia Plan  ASA: 3  Anesthesia Plan: General   Post-op Pain Management:    Induction: Intravenous  PONV Risk Score and Plan: 1 and Ondansetron  and Treatment may vary due to age or medical condition  Airway  Management Planned: Oral ETT  Additional Equipment:   Intra-op Plan:   Post-operative Plan: Extubation in OR  Informed Consent:      Dental advisory given  Plan Discussed with: CRNA and Surgeon  Anesthesia Plan Comments:          Anesthesia Quick Evaluation

## 2024-07-16 NOTE — Transfer of Care (Signed)
 Immediate Anesthesia Transfer of Care Note  Patient: Alyssa Martin  Procedure(s) Performed: ATRIAL FIBRILLATION ABLATION  Patient Location: PACU  Anesthesia Type:General  Level of Consciousness: awake and alert   Airway & Oxygen Therapy: Patient Spontanous Breathing and Patient connected to face mask oxygen  Post-op Assessment: Report given to RN and Post -op Vital signs reviewed and stable  Post vital signs: Reviewed and stable  Last Vitals:  Vitals Value Taken Time  BP    Temp    Pulse    Resp    SpO2      Last Pain:  Vitals:   07/16/24 0636  TempSrc: Oral  PainSc: 0-No pain         Complications: No notable events documented.

## 2024-07-16 NOTE — Progress Notes (Signed)
 Discharge instructions reviewed with Rosaline Sharps via telephone.

## 2024-07-17 ENCOUNTER — Encounter (HOSPITAL_COMMUNITY): Payer: Self-pay | Admitting: Cardiology

## 2024-07-17 ENCOUNTER — Telehealth (HOSPITAL_COMMUNITY): Payer: Self-pay

## 2024-07-17 MED FILL — Atropine Sulfate Soln Prefill Syr 1 MG/10ML (0.1 MG/ML): INTRAMUSCULAR | Qty: 10 | Status: AC

## 2024-07-17 MED FILL — Fentanyl Citrate Preservative Free (PF) Inj 100 MCG/2ML: INTRAMUSCULAR | Qty: 1 | Status: AC

## 2024-07-17 NOTE — Telephone Encounter (Signed)
 Spoke with patient to complete post procedure follow up call.  Patient reports no complications with groin sites.   Instructions reviewed with patient:  Remove large bandage at puncture site after 24 hours. It is normal to have bruising, tenderness, mild swelling, and a pea or marble sized lump/knot at the groin site which can take up to three months to resolve.  Get help right away if you notice sudden swelling at the puncture site.  Check your puncture site every day for signs of infection: fever, redness, swelling, pus drainage, warmth, foul odor or excessive pain. If this occurs, please call [phone], to speak with the RN Navigator. Get help right away if your puncture site is bleeding and the bleeding does not stop after applying firm pressure to the area.  You may continue to have skipped beats/ atrial fibrillation during the first several months after your procedure.  It is very important not to miss any doses of your blood thinner Xarelto .    You will follow up with the Afib clinic 4 weeks after your procedure and follow up with the APP 3 months after your procedure.  Activity restrictions reviewed.  Patient verbalized understanding to all instructions provided.

## 2024-07-23 ENCOUNTER — Encounter: Payer: Self-pay | Admitting: Radiology

## 2024-08-13 ENCOUNTER — Ambulatory Visit (HOSPITAL_COMMUNITY)
Admission: RE | Admit: 2024-08-13 | Discharge: 2024-08-13 | Disposition: A | Discharge status: Home / Self Care | Source: Ambulatory Visit | Attending: Physician Assistant | Admitting: Physician Assistant

## 2024-08-13 VITALS — BP 140/74 | HR 82 | Ht 59.0 in | Wt 124.6 lb

## 2024-08-13 DIAGNOSIS — D6869 Other thrombophilia: Secondary | ICD-10-CM | POA: Diagnosis not present

## 2024-08-13 DIAGNOSIS — I480 Paroxysmal atrial fibrillation: Secondary | ICD-10-CM | POA: Diagnosis not present

## 2024-08-13 DIAGNOSIS — I4891 Unspecified atrial fibrillation: Secondary | ICD-10-CM

## 2024-08-13 NOTE — Progress Notes (Addendum)
 Primary Care Physician: Loreli Elsie JONETTA Mickey., MD Primary Cardiologist: Peter Jordan, MD Electrophysiologist: Fonda Kitty, MD  Referring Physician: Dr Kitty Moccasin Alyssa Martin is a 78 y.o. female with a history of HLD, HTN, atrial fibrillation who presents for follow up in the [facility].  The patient was initially diagnosed with atrial fibrillation in 2018. Her episodes were increasing in frequency. She was seen by Dr Kitty and underwent afib ablation on 07/16/24. Patient is on Xarelto  for stroke prevention.    Patient presents today for follow up for atrial fibrillation. She remains in SR today and feels well. Her stamina is slowly improving. She denies any interim symptoms of afib. No chest pain or groin issues. She denies any bleeding issues on anticoagulation.   Today, she denies symptoms of palpitations, chest pain, shortness of breath, orthopnea, PND, lower extremity edema, dizziness, presyncope, syncope, snoring, daytime somnolence, bleeding, or neurologic sequela. The patient is tolerating medications without difficulties and is otherwise without complaint today.    Atrial Fibrillation Risk Factors:  she does not have symptoms or diagnosis of sleep apnea. she does not have a history of rheumatic fever.   Atrial Fibrillation Management history:  Previous antiarrhythmic drugs: none Previous cardioversions: none Previous ablations: 07/16/24 Anticoagulation history: Xarelto   ROS- All systems are reviewed and negative except as per the HPI above.  Past Medical History:  Diagnosis Date   A-fib (HCC)    Arthritis    Cancer (HCC)    melanoma on arm   Closed fracture of left proximal humerus    Closed right ankle fracture    Elevated BP    GERD (gastroesophageal reflux disease)    Hyperlipidemia    Hypertension    Impaired fasting glucose    Osteopenia    Osteoporosis    Paroxysmal atrial fibrillation (HCC)    Tinnitus    Vitamin D  deficiency    Wears glasses     Current Outpatient Medications  Medication Sig Dispense Refill   carboxymethylcellulose (REFRESH PLUS) 0.5 % SOLN Place 1 drop into both eyes as needed (dry eyes).     cetirizine (ZYRTEC) 10 MG chewable tablet Chew 10 mg by mouth at bedtime.     Cholecalciferol (VITAMIN D3) 125 MCG (5000 UT) CAPS Take 5,000 Units by mouth 2 (two) times a week.     diltiazem  (CARDIZEM  CD) 180 MG 24 hr capsule Take 180 mg by mouth daily.     Flaxseed, Linseed, (FLAX SEEDS PO) She mixes in her oatmeal daily     fluticasone (FLONASE) 50 MCG/ACT nasal spray Place into both nostrils daily as needed for allergies or rhinitis.     melatonin 5 MG TABS Take 5-10 mg by mouth at bedtime.     Multiple Vitamin (MULTIVITAMIN) tablet Take 1 tablet by mouth daily.     pantoprazole  (PROTONIX ) 40 MG tablet Take 40 mg by mouth daily as needed (acid reflux).     simvastatin  (ZOCOR ) 20 MG tablet Take 20 mg by mouth every evening.     vitamin C (ASCORBIC ACID) 500 MG tablet Take 500 mg by mouth daily.     XARELTO  20 MG TABS tablet TAKE 1 TABLET BY MOUTH DAILY WITH SUPPER 90 tablet 1   No current facility-administered medications for this encounter.    Physical Exam: BP (!) 140/74   Pulse 82   Ht 4' 11 (1.499 m)   Wt 56.5 kg   BMI 25.17 kg/m   GEN:  Well nourished, well developed in no acute distress CARDIAC: Regular rate and rhythm, no murmurs, rubs, gallops RESPIRATORY:  Clear to auscultation without rales, wheezing or rhonchi  ABDOMEN: Soft, non-tender, non-distended EXTREMITIES:  No edema; No deformity   Wt Readings from Last 3 Encounters:  08/13/24 56.5 kg  07/16/24 54.4 kg  04/24/24 58.3 kg     EKG Interpretation Date/Time:  Monday August 13 2024 14:14:48 EST Ventricular Rate:  82 PR Interval:  154 QRS Duration:  58 QT Interval:  358 QTC Calculation: 418 R Axis:   47  Text Interpretation: Sinus rhythm with Premature atrial complexes with Abberant conduction Low  voltage QRS Borderline ECG When compared with ECG of 16-Jul-2024 09:41, No significant change was found Confirmed by Karlin Heilman ([id number]) on 08/13/2024 2:24:53 PM    Echo 04/06/24 demonstrated   1. Left ventricular ejection fraction, by estimation, is 60 to 65%. The  left ventricle has normal function. The left ventricle has no regional  wall motion abnormalities. Left ventricular diastolic parameters are  consistent with Grade I diastolic dysfunction (impaired relaxation).   2. Right ventricular systolic function is normal. The right ventricular  size is normal.   3. Left atrial size was moderately dilated.   4. The mitral valve is normal in structure. Mild mitral valve  regurgitation. No evidence of mitral stenosis.   5. The aortic valve is normal in structure. Aortic valve regurgitation is  trivial. No aortic stenosis is present.   6. The inferior vena cava is normal in size with greater than 50%  respiratory variability, suggesting right atrial pressure of 3 mmHg.    CHA2DS2-VASc Score = 4  The patient's score is based upon: CHF History: 0 HTN History: 1 Diabetes History: 0 Stroke History: 0 Vascular Disease History: 0 Age Score: 2 Gender Score: 1       ASSESSMENT AND PLAN: Paroxysmal Atrial Fibrillation (ICD10:  I48.0) The patient's CHA2DS2-VASc score is 4, indicating a 4.8% annual risk of stroke.   S/p afib ablation 07/16/24 Patient appears to be maintaining SR Continue diltiazem  180 mg daily Continue Xarelto  20 mg daily with no missed doses for 3 months post ablation.  Apple Watch for home monitoring.   Secondary Hypercoagulable State (ICD10:  D68.69) The patient is at significant risk for stroke/thromboembolism based upon her CHA2DS2-VASc Score of 4.  Continue Rivaroxaban  (Xarelto ). No bleeding issues.   HTN Stable on current regimen   Follow up with Charlies Arthur as scheduled.     [facility] [address] [place], [place]  [address] [phone]

## 2024-08-17 ENCOUNTER — Other Ambulatory Visit: Payer: Self-pay | Admitting: Cardiology

## 2024-08-20 DIAGNOSIS — Z961 Presence of intraocular lens: Secondary | ICD-10-CM | POA: Diagnosis not present

## 2024-08-20 NOTE — Telephone Encounter (Signed)
 Prescription refill request for Xarelto  received.  Indication: a-fib Last office visit: 04/24/24 Weight: 56.5 Age: 78 Scr: 0.74 (06/26/2024 EPIC) CrCl: 55  Dose appropriate, refill sent

## 2024-09-27 ENCOUNTER — Encounter: Payer: Self-pay | Admitting: Cardiology

## 2024-09-27 NOTE — Progress Notes (Signed)
 "    Cardiology Office Note   Date:  09/28/2024   ID:  Alyssa Martin, Alyssa Martin Mar 08, 1946, MRN [id number]  PCP:  Loreli Elsie JONETTA Mickey., MD  Cardiologist:   Devoiry Corriher, MD   Chief Complaint  Patient presents with   Atrial Fibrillation     History of Present Illness: Alyssa Martin is a 79 y.o. female who presents for follow up Afib. She has a history of paroxysmal atrial fibrillation dating back to 2013.  She has been on cardizem  and Xarelto .    She had increased episodes of Afib this year. Echo showed normal LV function. Mild MR and LAE.  She was seen by Dr Kennyth and underwent afib ablation on 07/16/24. Patient is on Xarelto  for stroke prevention.   She has not had any recurrent Afib since by symptoms or by Apple watch.     Past Medical History:  Diagnosis Date   A-fib (HCC)    Arthritis    Cancer (HCC)    melanoma on arm   Closed fracture of left proximal humerus    Closed right ankle fracture    Elevated BP    GERD (gastroesophageal reflux disease)    Hyperlipidemia    Hypertension    Impaired fasting glucose    Osteopenia    Osteoporosis    Paroxysmal atrial fibrillation (HCC)    Tinnitus    Vitamin D deficiency    Wears glasses     Past Surgical History:  Procedure Laterality Date   ATRIAL FIBRILLATION ABLATION N/A 07/16/2024   Procedure: ATRIAL FIBRILLATION ABLATION;  Surgeon: Kennyth Chew, MD;  Location: [facility];  Service: Cardiovascular;  Laterality: N/A;   COLONOSCOPY  05/10/2011   CYSTOSCOPY/URETEROSCOPY/HOLMIUM LASER/STENT PLACEMENT Left 08/30/2023   Procedure: CYSTOSCOPY, LEFT URETEROSCOPY, LEFT URETERAL STENT PLACEMENT;  Surgeon: Lovie Arlyss CROME, MD;  Location: [facility];  Service: Urology;  Laterality: Left;  60 MINUTES   DIAGNOSTIC MAMMOGRAM  10 /09/2010   ORIF HUMERUS FRACTURE Left 08/18/2017   Procedure: OPEN REDUCTION INTERNAL FIXATION (ORIF) LEFT PROXIMAL HUMERUS FRACTURE;  Surgeon: Addie Cordella Hamilton, MD;  Location: [facility];  Service:  Orthopedics;  Laterality: Left;   TIBIA FRACTURE SURGERY  04/2006   Left--From falling down the stairs   TIBIA FRACTURE SURGERY     In her 40's     Current Outpatient Medications  Medication Sig Dispense Refill   carboxymethylcellulose (REFRESH PLUS) 0.5 % SOLN Place 1 drop into both eyes as needed (dry eyes).     cetirizine (ZYRTEC) 10 MG chewable tablet Chew 10 mg by mouth at bedtime.     Cholecalciferol (VITAMIN D3) 125 MCG (5000 UT) CAPS Take 5,000 Units by mouth 2 (two) times a week.     diltiazem  (CARDIZEM  CD) 180 MG 24 hr capsule Take 180 mg by mouth daily.     Flaxseed, Linseed, (FLAX SEEDS PO) She mixes in her oatmeal daily     fluticasone (FLONASE) 50 MCG/ACT nasal spray Place into both nostrils daily as needed for allergies or rhinitis.     melatonin 5 MG TABS Take 5-10 mg by mouth at bedtime.     Multiple Vitamin (MULTIVITAMIN) tablet Take 1 tablet by mouth daily.     pantoprazole  (PROTONIX ) 40 MG tablet Take 40 mg by mouth daily as needed (acid reflux).     simvastatin  (ZOCOR ) 20 MG tablet Take 20 mg by mouth every evening.     vitamin C (ASCORBIC ACID) 500 MG tablet Take 500 mg  by mouth daily.     XARELTO  20 MG TABS tablet TAKE 1 TABLET BY MOUTH DAILY WITH SUPPER 90 tablet 1   No current facility-administered medications for this visit.    Allergies:   Latex, Penicillins, and Ativan [lorazepam]    Social History:  The patient  reports that she has never smoked. She has never used smokeless tobacco. She reports that she does not drink alcohol  and does not use drugs.   Family History:  The patient's family history includes Bone cancer in her brother and father; Hypertension in her mother; Leukemia in her paternal grandfather; Lung cancer in her brother and paternal grandmother; Osteoarthritis in her mother; Osteopenia in her father.    ROS:  Please see the history of present illness.   Otherwise, review of systems are positive for none.   All other systems are reviewed  and negative.    PHYSICAL EXAM: VS:  BP 130/76   Pulse 63   Ht 4' 11 (1.499 m)   Wt 125 lb 8 oz (56.9 kg)   SpO2 95%   BMI 25.35 kg/m  , BMI Body mass index is 25.35 kg/m. GEN: Well nourished, well developed, in no acute distress  HEENT: normal  Neck: no JVD, carotid bruits, or masses Cardiac: IRRR; no murmurs, rubs, or gallops,no edema  Respiratory:  clear to auscultation bilaterally, normal work of breathing GI: soft, nontender, nondistended, + BS MS: no deformity or atrophy  Skin: warm and dry, no rash Neuro:  Strength and sensation are intact Psych: euthymic mood, full affect        Recent Labs: 06/26/2024: BUN 18; Creatinine, Ser 0.74; Hemoglobin 13.9; Platelets 258; Potassium 4.5; Sodium 142    Lipid Panel No results found for: CHOL, TRIG, HDL, CHOLHDL, VLDL, LDLCALC, LDLDIRECT   Dated 07/17/19: cholesterol 161, triglycerides 83, HDL 65, LDL79. A1c 5.5%. normal CBC and TSH Dated 11/22/19: normal CMET. Dated 08/19/21: cholesterol 155, triglycerides 82, HDL 53, LDL 86. A1c 5.5%. CMET and TSH normal. Dated 08/20/22: cholesterol 157, triglycerides 91, HDL 64, LDL 75. A1c 5.3%. CMET and TSH normal Dated 09/12/23: cholesterol 157, triglycerides 85, HDL 61, LDL 79. A1c 5.6%. CBC, CMET and TSH normal.   Wt Readings from Last 3 Encounters:  09/28/24 125 lb 8 oz (56.9 kg)  08/13/24 124 lb 9.6 oz (56.5 kg)  07/16/24 120 lb (54.4 kg)    Echo 04/06/24: IMPRESSIONS     1. Left ventricular ejection fraction, by estimation, is 60 to 65%. The  left ventricle has normal function. The left ventricle has no regional  wall motion abnormalities. Left ventricular diastolic parameters are  consistent with Grade I diastolic  dysfunction (impaired relaxation).   2. Right ventricular systolic function is normal. The right ventricular  size is normal.   3. Left atrial size was moderately dilated.   4. The mitral valve is normal in structure. Mild mitral valve   regurgitation. No evidence of mitral stenosis.   5. The aortic valve is normal in structure. Aortic valve regurgitation is  trivial. No aortic stenosis is present.   6. The inferior vena cava is normal in size with greater than 50%  respiratory variability, suggesting right atrial pressure of 3 mmHg.    ASSESSMENT AND PLAN:  1. Paroxysmal atrial fibrillation. Now s/p ablation without recurrent Afib. Continue Xarelto  and diltiazem .      Current medicines are reviewed at length with the patient today.  The patient does not have concerns regarding medicines.  The following changes  have been made:  no change  Labs/ tests ordered today include:   No orders of the defined types were placed in this encounter.     Disposition: FU in 6 months   Signed, Paeton Latouche, MD  09/28/2024 8:19 AM    [facility] [address], [place], [place], [address] Phone [phone], Fax [phone]  "

## 2024-09-28 ENCOUNTER — Ambulatory Visit: Attending: Cardiology | Admitting: Cardiology

## 2024-09-28 ENCOUNTER — Encounter: Payer: Self-pay | Admitting: Cardiology

## 2024-09-28 VITALS — BP 130/76 | HR 63 | Ht 59.0 in | Wt 125.5 lb

## 2024-09-28 DIAGNOSIS — I10 Essential (primary) hypertension: Secondary | ICD-10-CM

## 2024-09-28 DIAGNOSIS — I480 Paroxysmal atrial fibrillation: Secondary | ICD-10-CM

## 2024-09-28 NOTE — Patient Instructions (Signed)

## 2024-10-16 ENCOUNTER — Ambulatory Visit (INDEPENDENT_AMBULATORY_CARE_PROVIDER_SITE_OTHER): Admitting: Otolaryngology

## 2024-10-17 ENCOUNTER — Ambulatory Visit: Admitting: Physician Assistant

## 2024-10-31 ENCOUNTER — Ambulatory Visit (INDEPENDENT_AMBULATORY_CARE_PROVIDER_SITE_OTHER): Admitting: Otolaryngology

## 2024-11-15 ENCOUNTER — Ambulatory Visit: Admitting: Physician Assistant
